# Patient Record
Sex: Male | Born: 1935 | Race: White | Hispanic: No | Marital: Married | Smoking: Former smoker
Health system: Southern US, Community
[De-identification: ages and names within clinical notes are randomized; demographics above are authoritative.]

## PROBLEM LIST (undated history)

## (undated) DIAGNOSIS — J189 Pneumonia, unspecified organism: Secondary | ICD-10-CM

## (undated) DIAGNOSIS — J9610 Chronic respiratory failure, unspecified whether with hypoxia or hypercapnia: Secondary | ICD-10-CM

## (undated) DIAGNOSIS — I10 Essential (primary) hypertension: Secondary | ICD-10-CM

## (undated) DIAGNOSIS — I219 Acute myocardial infarction, unspecified: Secondary | ICD-10-CM

## (undated) DIAGNOSIS — N200 Calculus of kidney: Secondary | ICD-10-CM

## (undated) DIAGNOSIS — I2510 Atherosclerotic heart disease of native coronary artery without angina pectoris: Secondary | ICD-10-CM

## (undated) DIAGNOSIS — E1143 Type 2 diabetes mellitus with diabetic autonomic (poly)neuropathy: Secondary | ICD-10-CM

## (undated) DIAGNOSIS — IMO0001 Reserved for inherently not codable concepts without codable children: Secondary | ICD-10-CM

## (undated) DIAGNOSIS — R011 Cardiac murmur, unspecified: Secondary | ICD-10-CM

## (undated) DIAGNOSIS — E785 Hyperlipidemia, unspecified: Secondary | ICD-10-CM

## (undated) DIAGNOSIS — H269 Unspecified cataract: Secondary | ICD-10-CM

## (undated) DIAGNOSIS — I214 Non-ST elevation (NSTEMI) myocardial infarction: Secondary | ICD-10-CM

## (undated) HISTORY — PX: JOINT REPLACEMENT: SHX530

## (undated) HISTORY — DX: Hyperlipidemia, unspecified: E78.5

## (undated) HISTORY — PX: SPINE SURGERY: SHX786

## (undated) HISTORY — PX: CARDIAC CATHETERIZATION: SHX172

## (undated) HISTORY — DX: Essential (primary) hypertension: I10

## (undated) HISTORY — PX: CORONARY ARTERY BYPASS GRAFT: SHX141

## (undated) HISTORY — PX: EYE SURGERY: SHX253

## (undated) HISTORY — PX: LITHOTRIPSY: SUR834

## (undated) HISTORY — DX: Unspecified cataract: H26.9

## (undated) HISTORY — PX: CORONARY ANGIOPLASTY WITH STENT PLACEMENT: SHX49

## (undated) HISTORY — DX: Atherosclerotic heart disease of native coronary artery without angina pectoris: I25.10

## (undated) HISTORY — PX: TONSILLECTOMY: SUR1361

## (undated) HISTORY — PX: CATARACT EXTRACTION W/ INTRAOCULAR LENS IMPLANT: SHX1309

## (undated) HISTORY — DX: Acute myocardial infarction, unspecified: I21.9

## (undated) HISTORY — PX: BACK SURGERY: SHX140

---

## 1999-07-14 ENCOUNTER — Ambulatory Visit (HOSPITAL_COMMUNITY): Admission: RE | Admit: 1999-07-14 | Discharge: 1999-07-14 | Payer: Self-pay | Admitting: Orthopedic Surgery

## 1999-07-14 ENCOUNTER — Encounter: Payer: Self-pay | Admitting: Orthopedic Surgery

## 2000-03-09 ENCOUNTER — Ambulatory Visit (HOSPITAL_COMMUNITY): Admission: RE | Admit: 2000-03-09 | Discharge: 2000-03-10 | Payer: Self-pay | Admitting: Cardiovascular Disease

## 2001-10-02 ENCOUNTER — Encounter: Admission: RE | Admit: 2001-10-02 | Discharge: 2001-12-31 | Payer: Self-pay | Admitting: Internal Medicine

## 2002-07-13 ENCOUNTER — Ambulatory Visit (HOSPITAL_COMMUNITY): Admission: RE | Admit: 2002-07-13 | Discharge: 2002-07-13 | Payer: Self-pay | Admitting: Orthopedic Surgery

## 2002-07-13 ENCOUNTER — Encounter: Admission: RE | Admit: 2002-07-13 | Discharge: 2002-07-13 | Payer: Self-pay | Admitting: Orthopedic Surgery

## 2002-07-13 ENCOUNTER — Encounter: Payer: Self-pay | Admitting: Orthopedic Surgery

## 2002-07-18 ENCOUNTER — Ambulatory Visit (HOSPITAL_COMMUNITY): Admission: RE | Admit: 2002-07-18 | Discharge: 2002-07-18 | Payer: Self-pay | Admitting: Cardiovascular Disease

## 2002-08-10 ENCOUNTER — Inpatient Hospital Stay (HOSPITAL_COMMUNITY): Admission: RE | Admit: 2002-08-10 | Discharge: 2002-08-13 | Payer: Self-pay | Admitting: Orthopedic Surgery

## 2002-08-10 ENCOUNTER — Encounter: Payer: Self-pay | Admitting: Orthopedic Surgery

## 2003-09-26 HISTORY — PX: CARDIOVASCULAR STRESS TEST: SHX262

## 2003-10-01 ENCOUNTER — Ambulatory Visit (HOSPITAL_COMMUNITY): Admission: RE | Admit: 2003-10-01 | Discharge: 2003-10-01 | Payer: Self-pay | Admitting: Cardiovascular Disease

## 2004-03-04 ENCOUNTER — Encounter: Admission: RE | Admit: 2004-03-04 | Discharge: 2004-03-04 | Payer: Self-pay | Admitting: Orthopedic Surgery

## 2004-08-10 ENCOUNTER — Encounter: Admission: RE | Admit: 2004-08-10 | Discharge: 2004-08-10 | Payer: Self-pay | Admitting: Internal Medicine

## 2006-08-13 ENCOUNTER — Inpatient Hospital Stay (HOSPITAL_COMMUNITY): Admission: EM | Admit: 2006-08-13 | Discharge: 2006-08-18 | Payer: Self-pay | Admitting: Emergency Medicine

## 2006-08-15 ENCOUNTER — Encounter: Payer: Self-pay | Admitting: Cardiovascular Disease

## 2006-08-17 ENCOUNTER — Ambulatory Visit: Payer: Self-pay | Admitting: Physical Medicine & Rehabilitation

## 2006-09-28 ENCOUNTER — Ambulatory Visit (HOSPITAL_COMMUNITY): Admission: RE | Admit: 2006-09-28 | Discharge: 2006-09-28 | Payer: Self-pay | Admitting: Orthopedic Surgery

## 2006-09-28 ENCOUNTER — Encounter (INDEPENDENT_AMBULATORY_CARE_PROVIDER_SITE_OTHER): Payer: Self-pay | Admitting: *Deleted

## 2006-10-12 ENCOUNTER — Encounter (INDEPENDENT_AMBULATORY_CARE_PROVIDER_SITE_OTHER): Payer: Self-pay | Admitting: Specialist

## 2006-10-12 ENCOUNTER — Ambulatory Visit (HOSPITAL_COMMUNITY): Admission: RE | Admit: 2006-10-12 | Discharge: 2006-10-12 | Payer: Self-pay | Admitting: Orthopedic Surgery

## 2007-09-06 ENCOUNTER — Encounter: Admission: RE | Admit: 2007-09-06 | Discharge: 2007-09-06 | Payer: Self-pay | Admitting: Orthopedic Surgery

## 2008-02-04 ENCOUNTER — Emergency Department (HOSPITAL_COMMUNITY): Admission: EM | Admit: 2008-02-04 | Discharge: 2008-02-04 | Payer: Self-pay | Admitting: Emergency Medicine

## 2008-02-06 ENCOUNTER — Encounter (INDEPENDENT_AMBULATORY_CARE_PROVIDER_SITE_OTHER): Payer: Self-pay | Admitting: Orthopedic Surgery

## 2008-02-07 ENCOUNTER — Inpatient Hospital Stay (HOSPITAL_COMMUNITY): Admission: RE | Admit: 2008-02-07 | Discharge: 2008-02-10 | Payer: Self-pay | Admitting: Orthopedic Surgery

## 2008-04-09 ENCOUNTER — Emergency Department (HOSPITAL_COMMUNITY): Admission: EM | Admit: 2008-04-09 | Discharge: 2008-04-10 | Payer: Self-pay | Admitting: Emergency Medicine

## 2008-05-07 ENCOUNTER — Inpatient Hospital Stay (HOSPITAL_COMMUNITY): Admission: RE | Admit: 2008-05-07 | Discharge: 2008-05-10 | Payer: Self-pay | Admitting: Orthopedic Surgery

## 2008-05-07 ENCOUNTER — Encounter (INDEPENDENT_AMBULATORY_CARE_PROVIDER_SITE_OTHER): Payer: Self-pay | Admitting: Orthopedic Surgery

## 2008-11-21 ENCOUNTER — Inpatient Hospital Stay (HOSPITAL_COMMUNITY): Admission: EM | Admit: 2008-11-21 | Discharge: 2008-11-29 | Payer: Self-pay | Admitting: Emergency Medicine

## 2008-11-25 ENCOUNTER — Encounter (INDEPENDENT_AMBULATORY_CARE_PROVIDER_SITE_OTHER): Payer: Self-pay | Admitting: Neurological Surgery

## 2009-01-16 HISTORY — PX: US ECHOCARDIOGRAPHY: HXRAD669

## 2009-03-05 ENCOUNTER — Emergency Department (HOSPITAL_COMMUNITY): Admission: EM | Admit: 2009-03-05 | Discharge: 2009-03-06 | Payer: Self-pay | Admitting: Emergency Medicine

## 2010-09-02 ENCOUNTER — Ambulatory Visit: Payer: Self-pay | Admitting: Cardiovascular Disease

## 2011-03-03 LAB — POCT I-STAT, CHEM 8
BUN: 31 mg/dL — ABNORMAL HIGH (ref 6–23)
Calcium, Ion: 1.19 mmol/L (ref 1.12–1.32)
HCT: 39 % (ref 39.0–52.0)
TCO2: 27 mmol/L (ref 0–100)

## 2011-03-04 ENCOUNTER — Other Ambulatory Visit: Payer: Self-pay | Admitting: Cardiovascular Disease

## 2011-03-04 DIAGNOSIS — I2510 Atherosclerotic heart disease of native coronary artery without angina pectoris: Secondary | ICD-10-CM

## 2011-03-05 NOTE — Telephone Encounter (Signed)
escribe medication per fax request  

## 2011-03-08 LAB — GLUCOSE, CAPILLARY
Glucose-Capillary: 103 mg/dL — ABNORMAL HIGH (ref 70–99)
Glucose-Capillary: 149 mg/dL — ABNORMAL HIGH (ref 70–99)
Glucose-Capillary: 158 mg/dL — ABNORMAL HIGH (ref 70–99)
Glucose-Capillary: 169 mg/dL — ABNORMAL HIGH (ref 70–99)
Glucose-Capillary: 172 mg/dL — ABNORMAL HIGH (ref 70–99)
Glucose-Capillary: 186 mg/dL — ABNORMAL HIGH (ref 70–99)
Glucose-Capillary: 189 mg/dL — ABNORMAL HIGH (ref 70–99)
Glucose-Capillary: 194 mg/dL — ABNORMAL HIGH (ref 70–99)
Glucose-Capillary: 202 mg/dL — ABNORMAL HIGH (ref 70–99)
Glucose-Capillary: 203 mg/dL — ABNORMAL HIGH (ref 70–99)
Glucose-Capillary: 220 mg/dL — ABNORMAL HIGH (ref 70–99)
Glucose-Capillary: 220 mg/dL — ABNORMAL HIGH (ref 70–99)
Glucose-Capillary: 226 mg/dL — ABNORMAL HIGH (ref 70–99)
Glucose-Capillary: 247 mg/dL — ABNORMAL HIGH (ref 70–99)
Glucose-Capillary: 249 mg/dL — ABNORMAL HIGH (ref 70–99)
Glucose-Capillary: 265 mg/dL — ABNORMAL HIGH (ref 70–99)
Glucose-Capillary: 290 mg/dL — ABNORMAL HIGH (ref 70–99)
Glucose-Capillary: 322 mg/dL — ABNORMAL HIGH (ref 70–99)

## 2011-03-08 LAB — BASIC METABOLIC PANEL
BUN: 20 mg/dL (ref 6–23)
BUN: 23 mg/dL (ref 6–23)
Calcium: 8.2 mg/dL — ABNORMAL LOW (ref 8.4–10.5)
Chloride: 112 mEq/L (ref 96–112)
Creatinine, Ser: 0.99 mg/dL (ref 0.4–1.5)
Creatinine, Ser: 1.2 mg/dL (ref 0.4–1.5)
GFR calc Af Amer: >60 mL/min (ref >60)
GFR calc non Af Amer: 59 mL/min — ABNORMAL LOW (ref >60)
GFR calc non Af Amer: >60 mL/min (ref >60)

## 2011-03-08 LAB — HEMOCCULT GUIAC POC 1CARD (OFFICE): Fecal Occult Bld: NEGATIVE

## 2011-03-08 LAB — COMPREHENSIVE METABOLIC PANEL
ALT: 19 U/L (ref 0–53)
Alkaline Phosphatase: 59 U/L (ref 39–117)
BUN: 20 mg/dL (ref 6–23)
CO2: 27 mEq/L (ref 19–32)
CO2: 31 mEq/L (ref 19–32)
Calcium: 8.1 mg/dL — ABNORMAL LOW (ref 8.4–10.5)
Calcium: 9.3 mg/dL (ref 8.4–10.5)
Creatinine, Ser: 0.92 mg/dL (ref 0.4–1.5)
GFR calc non Af Amer: >60 mL/min (ref >60)
GFR calc non Af Amer: >60 mL/min (ref >60)
Glucose, Bld: 134 mg/dL — ABNORMAL HIGH (ref 70–99)
Glucose, Bld: 247 mg/dL — ABNORMAL HIGH (ref 70–99)
Sodium: 142 mEq/L (ref 135–145)
Total Bilirubin: 0.6 mg/dL (ref 0.3–1.2)

## 2011-03-08 LAB — AFB CULTURE WITH SMEAR (NOT AT ARMC)

## 2011-03-08 LAB — CULTURE, BLOOD (ROUTINE X 2): Culture: NO GROWTH

## 2011-03-08 LAB — CBC
HCT: 29.3 % — ABNORMAL LOW (ref 39.0–52.0)
Hemoglobin: 10 g/dL — ABNORMAL LOW (ref 13.0–17.0)
Hemoglobin: 12.6 g/dL — ABNORMAL LOW (ref 13.0–17.0)
Hemoglobin: 9.8 g/dL — ABNORMAL LOW (ref 13.0–17.0)
MCHC: 33.2 g/dL (ref 30.0–36.0)
MCHC: 34 g/dL (ref 30.0–36.0)
MCHC: 34 g/dL (ref 30.0–36.0)
MCV: 100.8 fL — ABNORMAL HIGH (ref 78.0–100.0)
MCV: 101.1 fL — ABNORMAL HIGH (ref 78.0–100.0)
Platelets: 126 10*3/uL — ABNORMAL LOW (ref 150–400)
Platelets: 135 10*3/uL — ABNORMAL LOW (ref 150–400)
RBC: 2.82 MIL/uL — ABNORMAL LOW (ref 4.22–5.81)
RBC: 2.84 MIL/uL — ABNORMAL LOW (ref 4.22–5.81)
RBC: 3 MIL/uL — ABNORMAL LOW (ref 4.22–5.81)
RBC: 3.76 MIL/uL — ABNORMAL LOW (ref 4.22–5.81)
RDW: 13.9 % (ref 11.5–15.5)
RDW: 16.2 % — ABNORMAL HIGH (ref 11.5–15.5)
WBC: 10.2 10*3/uL (ref 4.0–10.5)
WBC: 9.4 10*3/uL (ref 4.0–10.5)

## 2011-03-08 LAB — FUNGUS CULTURE W SMEAR: Fungal Smear: NONE SEEN

## 2011-03-08 LAB — CROSSMATCH: ABO/RH(D): O NEG

## 2011-03-08 LAB — WOUND CULTURE: Culture: NO GROWTH

## 2011-03-08 LAB — ANAEROBIC CULTURE

## 2011-03-08 LAB — GRAM STAIN

## 2011-03-08 LAB — C-REACTIVE PROTEIN: CRP: 0.3 mg/dL — ABNORMAL LOW (ref <0.6)

## 2011-04-06 NOTE — Op Note (Signed)
NAME:  [name], [name]              ACCOUNT NO.:  [id number]   MEDICAL RECORD NO.:  [id number]          PATIENT TYPE:  OIB   LOCATION:  1614                         FACILITY:  [facility]   PHYSICIAN:  Marlowe Kays, M.D.  DATE OF BIRTH:  [birth year]   DATE OF PROCEDURE:  02/06/2008  DATE OF DISCHARGE:                               OPERATIVE REPORT   PREOPERATIVE DIAGNOSES:  1. Spinal stenosis, L2-3.  2. Herniated nucleus pulposus with free fragment of herniated disk, L2-      3, left.   POSTOPERATIVE DIAGNOSES:  1. Spinal stenosis, L2-3.  2. Herniated nucleus pulposus with free fragment of herniated disk, L2-      3, left.   OPERATION:  1. A central decompressive laminectomy, L2-3.  2. Microdiskectomy with removal of large amount of free fragments, L2-      3, left.   SURGEON:  Illene Labrador. Aplington, MD.   Threasa HeadsGeorges Lynch. Gioffre, MD.   ANESTHESIA:  General.   PATHOLOGY AND INDICATIONS FOR PROCEDURE:  He has had a number of back  surgeries in the past, has a very arthritic back.  Following an incident  at home around January 20th, he had sudden onset of pain into his back  and legs, primarily his left leg.  He has a peripheral neuropathy due to  diabetes with a severe progression of his pain recently.  He had a  lumbar MRI with gadolinium performed on January 24, 2008, which  demonstrated the above diagnoses.  Accordingly, because of the  significant pain and disability, he is here today for the above-  mentioned surgery.  It was felt that there were two components to his  problem with both his spinal stenosis and the disk herniation.   PROCEDURE:  Prophylactic antibiotics, satisfactory general anesthesia,  prone position on the Wilson frame.  Back was prepped with DuraPrep,  draped in a sterile field, a timeout performed.  With three spinal  needles and a lateral x-ray, I tentatively localized the area of surgery  and made a midline incision at that point.  Due to prior  surgery, he had  had bone removed at L3 and we felt was most likely the spinous process  of L2.  We isolated it and tied with a Kocher clamp, a lateral x-ray  confirming that we were at L2-3 with the clamp exactly at the level of  the disk space.  Accordingly, we dissected soft tissue off the neural  arch of L2 and marked cephalad and caudad along the limits of bone and  then with 2 and 3 mm Kerrison rongeurs as well as a double-action  rongeur began the decompression, working up to a previous level of  decompression at L1 and distally at L3.  When the dissection became more  difficult, we brought in the microscope, he had a significant bony and  ligamentum flavum stenosis, which we resected.  The foramina for both L3  nerve roots was widely opened, we had to do a good bit of dissection  laterally at L2-3 to adequate exposure, but we were finally able to  identify safely the dura and the L3 nerve root, what we thought was the  disk, which I identified with a spinal needle and lateral x-ray  confirming that we were at the L2-3 disk.  Based on this, I opened up  the interspace with a 15 knife blade, a large amount of disk material  came forth under pressure.  With a nerve hook, we probed up around the  body of L2 cephalad and I removed at least 3 to 4 large fragments of  disk material, which was sent to Pathology.  We kept probing until all  disk material obtainable had been removed, and we also removed all disk  material from the interspace that was possible.  At the conclusion of  the case, the foramen was widely patent and there was no unusual  bleeding, Gelfoam soaked in thrombin was placed around the interspace  and around the nerve and over the dura.  The self-retaining McCullough  retractor was removed carefully, and the closure was performed with  interrupted #1 Vicryl in the paralumbar muscle and fascia, two #0 Vicryl  in the subcutaneous tissues, staples in the skin, a Betadine,  Adaptic,  and a dry sterile dressing were applied, he tolerated the procedure well  and was taken to the recovery room in satisfactory condition with no  known complications.           ______________________________  Marlowe Kays, M.D.     JA/MEDQ  D:  02/06/2008  T:  02/06/2008  Job:  [id number]

## 2011-04-06 NOTE — Discharge Summary (Signed)
[name], [name]              ACCOUNT NO.:  [id number]   MEDICAL RECORD NO.:  [id number]          PATIENT TYPE:  INP   LOCATION:  1614                         FACILITY:  [facility]   PHYSICIAN:  Marlowe Kays, M.D.  DATE OF BIRTH:  1928-01-16   DATE OF ADMISSION:  02/06/2008  DATE OF DISCHARGE:  02/09/2008                               DISCHARGE SUMMARY   ADMITTING DIAGNOSES:  1. Spinal stenosis L2-L3, with herniated nucleus pulposus, with free      fragment of herniated disc at L2-L3 on the left.  2. Diabetes mellitus.  3. Hypertension.   DISCHARGE DIAGNOSES:  1. Spinal stenosis L2-L3, with herniated nucleus pulposus, with free      fragment of herniated disc at L2-L3 on the left.  2. Diabetes mellitus.  3. Hypertension.  4. Postoperative confusion secondary to analgesics.   OPERATION:  On February 06, 2008, the patient underwent:  1. Central decompressive lumbar that lumbar laminectomy at L2 L3.  2. Microdiskectomy, with removal of large amount of free fragments at      L2-L3 on the left.  Dr. Ranee Gosselin assisted.   CONSULTS:  Dr. Creola Corn, internal medicine.   BRIEF HISTORY:  This 77 year old white male with history of several  episodes of surgical intervention to the back in the past has developed  a very arthritic lumbar spine.  On December 12, 2007, he was at home, had  an incident work which caused a rapid onset of pain into his back and  legs.  This was primarily on the left than the right.  Lumbar MRI with  gadolinium was performed which demonstrated the spinal stenosis with the  hernia nucleus pulposus at L2-L3. This is an active gentleman despite  his age, but this pain was interfering with his day-to-day activities,  his sleep cycle, and after much discussion, including the risks and  benefits of surgery, it was decided to go ahead with the above surgery.   COURSE IN THE HOSPITAL:  The patient tolerated the surgical procedure  quite well.  The analgesics were  given postoperatively, as well as those  that were given for his postoperative pain, and caused a considerable  amount of confusion.  He also had elevated CBGs as well.  We asked Dr.  Creola Corn to see the patient and evaluate him, which he did.  We  discontinued his PCA morphine, cut back on his analgesics, and Dr. Timothy Lasso  was adjusted his insulin.  Eventually, the patient was able to become  more awake and alert.  Because of the above, he had difficulty with  early-on physical therapy and occupational therapy, and maximum assist,  difficulty even standing.  We kept the patient in the hospital for more  therapy and to determine if he was safe to go home or if he need to have  skilled nursing.  His wife is quite petite, and we did not feel that she  could manage him at home, and after much discussion with staff and  family as well as with Dr. Timothy Lasso, it was decided the patient would best  be served  for a short period of time to have physical therapy,  occupational therapy, and rehabilitation in skilled nursing for a short  while.   The patient had been [facility] for a fractured pelvis in the past, and  he chose to try to get into that facility.  At the time of this  dictation, those plans are being firmed up.   The patient had some residual numbness postoperatively, but this  resolved.  His strength in his lower extremities was 4.5/5 in the left  lower extremity, 5/5 in the right lower extremity.  His sensory  essentially was the same, as the patient had developed peripheral  neuropathy secondary to his diabetes.  Therapy done prior to the  anticipated day of discharge showed minimal assist with ambulation.   Laboratory findings in the hospital showed a stable hemoglobin at 11.1,  the hematocrit was 31.6.  White count was 8.7.  Blood chemistries on  February 08, 2008 were normal, other than slightly elevated glucose at 166.  Electrocardiogram showed sinus rhythm, with premature atrial  complexes,  T-wave abnormality, which pointed to consider lateral ischemia.  This  was compared with his August 15, 2006 EKG.  Chest x-ray showed no  active disease.   CONDITION ON DISCHARGE:  Improved, stable.   PLAN:  The patient may continue with his occupational therapy, physical  therapy, with weightbearing as tolerated in the lower extremities.  Will  use walker as necessary, progressing to achieve independence, safety,  and confidence with ambulation and activities of daily living.  Any  medical questions can be directed towards Dr. Creola Corn.  He is to  return to see Dr. Simonne Come approximately 2 to 2-1/2 weeks after the  date of surgery.   MEDICATIONS LIST ON THE CHART AT DISCHARGE:  1. Lisinopril 20 mg tab daily.  2. Voltaren 75 mg tab EC b.i.d.  3. Simvastatin 40 mg p.o. q.1800 hours.  4. Insulin per sliding scale or per Dr. Ferd Hibbs instructions.  5. Atenolol 12.5 mg at bedtime.  6. TriCor 145 mg tab daily.  7. Flomax 0.4 mg cap daily until able to void without difficulty.  8. Multivitamins with minerals daily.  9. Robaxin 500 mg q.6 h. p.r.n. muscle spasms.  10.Darvocet-N 100, 1-2 q.4-6 h. p.r.n. pain.   DIET:  As was preop, and based on Dr. Ferd Hibbs recommendations for his  diabetic state.   Any orthopedic questions can be directed towards Dr. Leim Fabry office  at [phone].      Dooley L. Cherlynn June.    ______________________________  Marlowe Kays, M.D.    DLU/MEDQ  D:  02/09/2008  T:  02/09/2008  Job:  [id number]   cc:   Gwen Pounds, MD  Fax: [phone]

## 2011-04-06 NOTE — Consult Note (Signed)
NAME:  [name], BAMBER NO.:  [id number]   MEDICAL RECORD NO.:  [id number]          PATIENT TYPE:  INP   LOCATION:  5501                         FACILITY:  [facility]   PHYSICIAN:  Alvy Beal, MD    DATE OF BIRTH:  1927/12/18   DATE OF CONSULTATION:  DATE OF DISCHARGE:                                 CONSULTATION   REFERRING PHYSICIAN:  Gwen Pounds, MD   REASON FOR CONSULTATION:  Intractable back pain with bilateral leg pain,  left side being worse than the right.   HISTORY:  [name] is a very pleasant 77 year old gentleman who has  been suffering with chronic low back and bilateral leg pain, right side  typically worse than the left since the mid 90s.  He has been followed  by my partner Dr. Simonne Come and has had a combination of 5 lumbar  posterior based operations, most recently was in 2003 where he had an L3-  4 decompression for lumbar spinal stenosis, and then in March 2009, he  had an L2-3 left-sided diskectomy for a L2-3 disk herniation and then in  June 2009 had a recurrent disk herniation at that same level and had a  repeat lumbar diskectomy at L2-3.  The patient states that he never  really got significant relief for any prolonged period of time from  these operations.  He noted that after the most recent one in June, his  pain was significantly worse.  The patient states that he has constant  intractable back pain.  Because Dr. Simonne Come felt there was no further  surgical options for him, Dr. Timothy Lasso had him evaluated by Dr. Vear Clock,  the pain medical management specialist.  Despite attempts multiple  lumbar epidural injections and appropriate narcotic pain medications,  the patient still has intractable pain.  It is difficult to use long-  acting narcotics as they affect his cognition.   At this point in time because of the failure of pain medical management,  the patient was referred to Dr. Opal Sidles (a neurosurgeon in [place]) for  evaluation.  In October 2009, he had this evaluation and was told by Dr.  Opal Sidles that further surgery on his back was not recommended.  According  to the patient, a CT scan and MRI were done by Dr. Opal Sidles, but  unfortunately, I do not have those to review at this time.   The patient states that he was in his usual state of discomfort until  about 4-5 days ago and the pain became quite severe.  He ultimately was  contacted Dr. Timothy Lasso and was instructed to go to the emergency room.  The  patient was admitted last evening on November 21, 2008, for intractable  pain.  Dr. Timothy Lasso contacted me as I was on-call for our group for further  evaluation and treatment.   At this point in time, the patient indicated that he denied any history  recently or in the past of incontinence of bowel or bladder.   At this point in time, on admission, Dr. Timothy Lasso did appropriate pain  management with multiple medications, and  the patient currently is  comfortable in bed.  It is difficult for him to move around in bed  because of severe pain and he has not been out of bed because of that.   The patient's past medical, surgical, family, social history is quite  extensive.  He has type 2 diabetes mellitus with peripheral diabetic  neuropathy, coronary artery disease with five-vessel coronary artery  bypass, and grafting in 1998, and percutaneous intervention in 2001,  hyperlipidemia, nephrolithiasis, history of PVCs, history of anemia,  history of pelvic fracture x2 including the pelvic rami fracture treated  nonoperatively.  He has had a tonsil and adenoidectomy.  He has had a  hand surgery.  He has had 5 previous lumbar spinal operations, rotator  cuff repair.  He has got a history of C. diff, history of  diverticulosis, and hemorrhoids.   ALLERGIES:  TETRACYCLINE, QUINIDINE, LONG-ACTING MORPHINE, AND FENTANYL.   CURRENT MEDICATIONS:  Upon admission include Diflucan, Lantus, Humalog,  OneTouch Ultra meter to  check blood sugars, Ultram, Neurontin,  lisinopril, atenolol, TriCor, NitroQuick, vitamin E, multivitamin,  simvastatin, Prilosec, and Cordran steroid cream as directed.   SOCIAL HISTORY:  He denies tobacco use and illicit drugs.  He was  recently married to his second wife and he is retired.   PHYSICAL EXAMINATION:  GENERAL:  The patient currently is in a hospital  bed.  He is comfortable.  He is alert and oriented x3 and has complete  recollection of all the events and is clearly able to express them to  me.  NEUROLOGIC:  He is grossly neurologically intact.  Cranial nerves II  through XII were tested and they were intact.  CHEST:  He has no shortness of breath or chest pain at present.  ABDOMEN:  Soft and nontender.  No history of incontinence of bowel and  bladder.  MUSCULOSKELETAL:  He has no significant pain with hip, knee, and ankle  passive range of motion.  Intact, but diminished peripheral pulses in  the lower extremity.  He has 5/5 strength in tibialis anterior, EHL,  gastrocnemius.  He also has 5/5 strength in the hip extensors, hip  flexors, quad, and hamstrings.  He has a negative Babinski sign.  No  clonus.  Symmetrical but diminished deep tendon reflexes at the knee,  absent at the Achilles.  He has reproduction of back pain with straight  leg maneuvering bilaterally with the left side eliciting more pain than  the right.  There is no significant radicular leg pain present with  straight leg maneuvering.   He has no pain with palpation over the SI joint.  He has significant  pain when he attempts to sit up or move in bed.   LABORATORY DATA:  Clinical x-rays taken yesterday were compared to those  taken in June at the time of this surgery and to a CT scan.  The patient  has an obvious deformity at L2-3 with loss of vertebral body height of  L2 and disk erosion at L2-3.  He also has a slight lateral listhesis at  L2-3 causing a deformity.  He also has multilevel  degenerative lumbar  disease, L3-4, L4-5, L5-S1 with near complete collapse of the disk  spaces at all those levels.  There is loss of normal lumbar lordosis on  the sagittal plane.   At this point in time, I believe that the patient's major source of pain  is the L2-3 deformity.  This is consistent with a possible diskitis  or  osteomyelitis or may be an iatrogenic or late postoperative instability.   At this point in time, after lengthy discussion with the patient, I  indicated to him that surgery may be an option, but it is a very  extensive operation.  Before recommending any particular surgery, I did  indicate that there is some more information that I would need.  I agree  with the radiologist that an MRI with and without contrast is needed.  This would allow [place] to evaluate for recurrent disk herniation,  recurrence or severe spinal stenosis, and evidence of a diskitis or  osteomyelitis.  Furthermore, a CT scan would also be recommended as this  would allow better evaluation of the bony architecture especially the L2  vertebral body destruction.  Furthermore, I would like to obtain an  upright standing x-ray of the lumbar spine.  This would allow better  evaluation of the overall sagittal alignment when the spine is in a  weightbearing position.  Collectively with this information, I could  better determine whether or not a surgical procedure would be indicated  and what that procedure would be.  However, at first glance, I think if  surgery was going to be required, he would have to address the deformity  and instability at L2-3, which will be most likely an anterior L2  corpectomy with strut graft reconstruction and instrumentation.  Given  his age and previous surgeries, he would also most likely include  supplemental posterior fixation.  In addition, I will also have to  discuss with him the possibility of addressing the degenerative disk  disease and including that in the  fusion levels.  At this point, the  patient indicated he would like to speak with Dr. Timothy Lasso before  proceeding with any further tests.  We have also  talked about him trying to get his MRI and CT scan from the [facility] so  that these can be reviewed and we can avoid duplication of tests.  I  will see the patient again once all the appropriate tests have been  done.  If there is any questions or concerns.  I can be reached on my  pager [phone].      Alvy Beal, MD  Electronically Signed     DDB/MEDQ  D:  11/22/2008  T:  11/22/2008  Job:  [id number]   cc:   Gwen Pounds, MD

## 2011-04-06 NOTE — H&P (Signed)
NAMEJAYCUB, Barron NO.:  [id number]   MEDICAL RECORD NO.:  [id number]          PATIENT TYPE:  INP   LOCATION:  5501                         FACILITY:  [facility]   PHYSICIAN:  Gwen Pounds, MD       DATE OF BIRTH:  [birth year]   DATE OF ADMISSION:  11/21/2008  DATE OF DISCHARGE:                              HISTORY & PHYSICAL   PRIMARY CARE Zuhair Lariccia:  The primary care Gordon Vandunk is myself.   CARDIOLOGIST:  Vesta Mixer, M.D.   ORTHOPEDIC SURGEON:  Marlowe Kays, M.D.   PAIN MANAGEMENT PHYSICIAN:  Pain control physician is Mark L. Vear Clock,  M.D.   NEUROSURGEON:  The patient's neurosurgeon is over at [facility], Bartholomew Crews, M.D.   CHIEF COMPLAINT:  Progressive worsening of low-back pain and failure to  thrive.   HISTORY OF THE PRESENT ILLNESS:  This is a 77 year old male with  multiple problems status post five lower back surgeries with two in the  last year; and, Dr. Simonne Come did all of them.  The patient saw Dr.  Simonne Come last in July and then saw Dr. Burtis Junes at [facility] on August 28, 2008.  Dr. Burtis Junes really did not have  too much to offer and stated further surgeries would to be too risky.  The patient was subsequently referred to pain management, Dr. Vear Clock,  who has the patient on narcotics.  Unfortunately the patient cannot  tolerate long-acting medications including fentanyl and Darvocet.   Since seeing me about 2-3 weeks ago the patient is worse with increasing  pain, decreasing function and the lessened ability to move with his  walker over the last couple days and it has worsened.  The last 2 nights  he has been up all night with pain, but he denies any functional issues  with bowels or bladder.  The patient and family called today with  complaints of increasing pain and not eating very well, and the patient  was miserable and could not get out of bed.  We told him to call 9-1-1  and go  to the ED.  He denies any falls or trauma, or events leading to  the worsening.  He was doing okay until Sunday when his symptoms clearly  started  worsening.  Dr. Vear Clock had suggested getting a third opinion  with maybe Dr. Noel Gerold; and, is in the process of getting this set up.  In  the ED the patient was given Zofran, Toradol and Dilaudid.  He is  currently okay currently lying in bed.  He will be admitted for further  evaluation and treatment.  He is now also complaining of left leg pain  with left-sided radiculopathy at that aspect.   PAST MEDICAL AND SURGICAL HISTORY:  The past medical and surgical  history include:  1. Type 2 diabetes mellitus.  2. Neuropathy.  3. Coronary artery disease with five-vessel coronary artery bypass      grafting in 1998 and percutaneous intervention in 2001.  4. Hyperlipidemia.  5. Nephrolithiasis.  6. History of PVCs.  7. History of  anemia.  8. History of pelvic fracture x2 including pubic ramus fracture.  9. History of tonsilloadenoidectomy.  10.History of hand surgery.  11.History of 3 lumbar laminectomies in the 1990s and 2003.  12.History of  L2-3 laminectomy and microdiskectomy in March 2009 and      June 2009.  13.History of rotator cuff repair in 2005.  14.History of C-difficile colitis in September 2005.  15.History of diverticulosis; and,  16.Hemorrhoids.   ALLERGIES:  ALLERGIES INCLUDE TETRACYCLINE, QUINIDINE, LONG-ACTING  MORPHINE AND FENTANYL.   MEDICATIONS:  The patient's medication list includes:  1. Diclofenac 75 mg twice a day.  2. Lantus 40 units at bedtime.  3. Humalog Pen as directed.  4. One touch Ultra meter to check blood sugars as directed.  5. Ultram 50 mg by mouth twice a day.  6. Neurontin 300 mg by mouth four times a day.  7. Lisinopril 20 mg by mouth every day.  8. Atenolol 25 mg 1/2 every day.  9. Tricor 145 mg by mouth every day.  10.NitroQuick 0.4 mg the sublingual as needed.  11.Vitamin E 400 units every  day.  12.Multivitamin 1 by mouth every day.  13.Simvastatin 40 mg by mouth every day.  14.Prilosec 20 mg by mouth every day.  15.Cordran steroid cream to be used as directed.   FAMILY HISTORY:  Diabetes is in the family.  Father died of pneumonia at  the age of 56.  Mother died at age 34 of old age.   SOCIAL HISTORY:  The patient is newly married to his second wife.  Retired.  No alcohol.  No tobacco.   REVIEW OF SYSTEMS:  Diarrhea 1-2 days ago, now improved with Imodium.  All the review of systems is negative; see HPI.  He denies any fevers,  any chills, and no chest pain, no shortness of breath, and no GI  bleeding.   PHYSICAL EXAMINATION:  VITAL SIGNS:  Temperature 98.3, blood pressure  187/79, heart rate 57, respiratory rate 18, and satting 97% on room air.  GENERAL APPEARANCE:  In general he is alert and oriented.  HEENT:  Oropharynx is clear.  NECK:  The neck has no JVD.  CHEST:  The chest is clear to auscultation.  HEART:  Cardiac - regular rhythm and sternotomy noted.  ABDOMEN:  The abdomen is soft and nontender.  EXTREMITIES:  The extremities shows the left leg with positive straight  leg raise.  Left low foot ulcer is gone.  No edema bilaterally.   ASSESSMENT:  This is an elderly man who has been on 4 Darvocets per day  and off his MS Contin because it caused too much confusion, on Ultram up  3 times per day, on Voltaren up to 2 times per day, and on Neurontin up  to 4 times per day who presents with worsening and severe low-back pain,  left leg radiculopathy and failure to thrive.   PLAN:  1. We will admit the patient.  2. We will increase pain management.  3. We will keep diabetes tight as possible.  4. We will add steroids and watch blood sugars carefully.  5. We will talk with [facility] about what they would do; I have      already talked with Dr. Venita Lick, and will see him and talk      about the stimulation and whether we need to do another MRI.   6. I will check another x-ray tonight of his low-back and rule out  fracture.  7. The patient is requesting possible consultation with Dr. Noel Gerold; I      will hold on this at the moment until after Dr. Shon Baton sees the      patient.  8. We will work on increasing function.  9. We will discontinue Zocor.  10.After I get the doses of home medications I will use them      accordingly.  11.No current coronary artery disease issues.  12.DVT prophylaxis has been ordered.  13.The recent left heel mild ulcer has already healed.  14.Hypertension; his blood pressure has been erratic due to the pain.  15.Severe low-back pain and other issues as described above.  He      remains on multiple medications; he cannot tolerate anything      stronger.  We are at the point where we kind of need to figure out      what the next step is in order to give this guy a quality of life      for which he really has not any.  16.Diarrhea.  He is not currently dehydrated.  His blood sugars are      fine.  He has been on a [employer] of late, he has taken Imodium and      he is already better.  We will follow carefully especially with his      history of C diff.  17.As reported per Dr. Burtis Junes in October the patient is a 77 year old      man with significant degenerative back disease who presents due to      persistent pain.  At this time we will presently continue workup      for possible surgical intervention.  They ordered scoliosis films.      They ordered MRI of the cervical, thoracic and lumbar spine.  I do      not have access to these at this current time.  He discussed  with      the patient the risks of possible surgery, but we will continue the      evaluation and discuss possible surgery with the next evaluation.      At that point he told the patient that there is not much he can      offer.  The report will be included in the hospital chart.      Gwen Pounds, MD  Electronically Signed      JMR/MEDQ  D:  11/21/2008  T:  11/22/2008  Job:  [id number]   cc:   Vesta Mixer, M.D.  Marlowe Kays, M.D.  Kathrin Penner. Vear Clock, M.D.  Bartholomew Crews

## 2011-04-06 NOTE — Op Note (Signed)
NAME:  [name], [name]              ACCOUNT NO.:  [id number]   MEDICAL RECORD NO.:  [id number]          PATIENT TYPE:  INP   LOCATION:  3102                         FACILITY:  [facility]   PHYSICIAN:  Stefani Dama, M.D.  DATE OF BIRTH:  Feb 14, 1928   DATE OF PROCEDURE:  11/25/2008  DATE OF DISCHARGE:                               OPERATIVE REPORT   PREOPERATIVE DIAGNOSIS:  Status post diskitis, osteomyelitis of L2-L3  with kyphoscoliosis, lumbar radiculopathy and back pain.   POSTOPERATIVE DIAGNOSIS:  Status post diskitis, osteomyelitis of L2-L3  with kyphoscoliosis, lumbar radiculopathy and back pain.   OPERATION:  Bilateral decompression of L2-L3 nerve roots with bilateral  diskectomy, pedicle screw fixation at L2-L3 with posterolateral  arthrodesis at L2-L3, posterior lumbar interbody arthrodesis with PEEK  spacers, local autograft and allograft L2-L3.   SURGEON:  Stefani Dama, MD   FIRST ASSISTANT:  Coletta Memos, MD   ANESTHESIA:  General endotracheal.   INDICATIONS:  [name] is a 77 year old individual who has had  previous diskectomy at L2-L3 which though initially improved seemed to  cause substantial back pain and leg pain with deterioration of his  function.  He underwent an MRI few days ago which demonstrates the  presence of a recurrent disk herniation with a severe kyphoscoliosis at  L2-L3 and findings consistent with diskitis and osteomyelitis.  Nonetheless, the patient has a normal white count and normal sed rate,  no fever.  He was really reasonably comfortable with recumbency.  It is  suspicious that he may have had a diskitis and osteomyelitis but this is  likely a non-active process.  However, because of the findings on the  films, I still felt that the patient needs to be decompressed and  stabilized.  Plan was to do a decompression and debridement if pus was  found.  If no active infection was noted, we will do a fusion.   PROCEDURE:  The patient  was brought to the operating room supine on the  stretcher.  After smooth induction of general endotracheal anesthesia,  he was turned prone and the back was then prepped with alcohol and  DuraPrep and draped in a sterile fashion.  An elliptical incision was  made around his previous scars and these were excised.  The lumbodorsal  fascia was then opened in the midline and the previous site of  laminectomy at L2-3 was uncovered.  Dissection was carried down to  localize the L2-L3.  This area was verified with a singular radiograph.  Then by working from the left side, the lateral aspect of the dura was  uncovered, ultimately, the disk space was entered.  There was noted to  be significant amount of very dense fibrous scar tissue, however, there  were pockets of tissue that appeared to be consistent with a caseating  type of necrotic material.  This was not consistent with pus.  As the  disk space was entered, several swabs were obtained for stat Gram stain  and also cultures however, no pus was encountered.  Some fluid was  encountered in the disk which was seen  mostly serosanguineous.  The area  was then explored and the common dural tube  was decompressed and the L2  nerve root was decompressed superiorly, L3 nerve root was decompressed  inferiorly on the left side.  Once this was completed, the dissection  was carried over to the right side where similar decompression was  undertaken to the L2 and the L3 nerve root.  The disk space was entered  here and the diskectomy was performed.  The right side of the disk space  was noted to be severely more collapsed than the left side.  Nonetheless  decortication of the endplates was undertaken.  Once these were prepared  adequately, interspace was sized for a 12-mm spacer.  A 12 mm spacer was  then also placed on the left side.  Bone graft was prepared in the form  of some of the autograft from the resection of the right inferior facet  joint  from L2.  Lateral gutters were decorticated and then EquivaBone a  total of 10 mL was prepared along with an extra small mixture of the  infuse bone morphogenic protein.  This combination was used in the  interspace both in the PEEK spacers and the surrounding bone grafting  site.  The intertransverse space on the right side and on the left side  were then packed with some remnants of the bone, then using fluoroscopic  guidance, pedicle screw entry sites were chosen at L2-L3 after adjusting  the position of the fluoro unit to visualize clearly each of the  individual pedicles.  A 6.5 x 50 mm screws were tapped into the bone and  at L2 and L3.  These were countersunk appropriately.  Then 40 mm  straight rods were used to connect the screw heads.  This was also used  in an effort to de-rotate the scoliotic curve that occurred at L2-L3.  Once this was accomplished, the transverse connector was put into place.  The remainder of the graft was packed into the lateral gutters.  The  area was inspected to make sure that the L2-L3 nerve roots were well  decompressed, which they were.  No CSF leaks were noted and once this  was finished, the wound was irrigated and then closed with #1 Vicryl in  the lumbodorsal fascia, 2-0 Vicryl in the subcutaneous tissues, 3-0  Vicryl subcuticularly and surgical staples were placed in the skin.  Blood loss for the procedure was estimated at 900 mL.  300 mL of Cell  Saver blood was returned to the patient.  The patient tolerated the  procedure well and was returned to recovery room.      Stefani Dama, M.D.  Electronically Signed     HJE/MEDQ  D:  11/25/2008  T:  11/26/2008  Job:  [id number]

## 2011-04-06 NOTE — Op Note (Signed)
[name], [name]              ACCOUNT NO.:  [id number]   MEDICAL RECORD NO.:  [id number]          PATIENT TYPE:  INP   LOCATION:  1615                         [facility]   PHYSICIAN:  Marlowe Kays, M.D.  DATE OF BIRTH:  1928/01/19   DATE OF PROCEDURE:  05/07/2008  DATE OF DISCHARGE:                               OPERATIVE REPORT   PREOPERATIVE DIAGNOSIS:  Recurrent herniated nucleus pulposus, L2-3  central and to the left with multiple extruded disk fragments.   POSTOPERATIVE DIAGNOSIS:  Recurrent herniated nucleus pulposus, L2-3  central and to the left with multiple of extruded disk fragments.   OPERATION:  Microdiskectomy L2-3 left with removal of possible extruded  disk fragments as well as microdiskectomy of the L2-3 interspace.   SURGEON:  Marlowe Kays, M.D.   ASSISTANT:  Georges Lynch. Darrelyn Hillock, M.D.   ANESTHESIA:  General.   INDICATIONS FOR PROCEDURE:  He had had a prior microdiskectomy roughly 3  months ago at the same level.  Initially did reasonably well but  subsequently developed progressive right leg pain.  Previously his pain  had been on the left.  I obtained a lumbar MRI with gadolinium May 01, 2008 which showed a huge amount of disk material extruded in the left  lateral recess and centrally.  His pain was almost exclusively on the  right, but it was clear on the MRI that it almost all the disk material  was to the left and I explained to him that we would go back in on the  left side because of this and it was possible that the right leg pain  was due to disk material pressing to his right.   PROCEDURE:  Prophylactic antibiotics.  Prone position on the Wilson  frame.  Back was prepped with DuraPrep, draped in a sterile field.  Time-  out performed.  I went through the old surgical incision, excising the  scar superiorly.  The spinous process which I thought was for the L1-L2  blocked vertebra was palpated and tagged with a Kocher clamp.  Lateral  x-  ray confirmed this indeed was spinous process of L1-L2 and just a little  higher than the disk fragments which were extruded both cephalad and  caudad from the L2-3 interspace.  Accordingly, we dissected slightly to  the right side to allow purchase of the self-retaining retractors and  then I worked laterally on the left side, working off the bone, feeling  the facet joints and then took a second x-ray with both a  ___________  and distal to that a Penfield-4 instrument.  This gave [place] the initial  anatomic information we needed with the __________  at the L2-3  interspace and a Penfield-4 at the distal-most portion where we felt the  extruded disk material lay.  The self-retaining McCullough retractors  were inserted and I then begin cautiously removing soft tissue off the  bone laterally, gradually working to the midline until I got to the bone  dural interface.  I was then able to cautiously place a 2 mm Kerrison  rongeur laterally and began removing  bone laterally working cephalad and  caudad until I had worked around lateral to the dura.  Superior facet of  superior articular process of the L2-3 facet was loose and I removed it,  which gave [place] additional exposure.  At this point, we got our first  glimpse of the extraordinary amount of disk material which again we were  removing with micropituitary as this gave [place] more exposure.  We kept  working, removing more disk material and gradually freeing up the  meninges and eventually identifying the L2-3 interspace which I was able  to enter and removed a good bit of additional disk material from that.  Then using this as a anatomic landmark, using the combination of  Penfield-4 nerve hook and Epstein curette, we worked both cephalad and  caudad in keeping with MRI and then gradually centrally removing the  large amounts of disk material, some of which were in moderate size  fragments and some of it was in smaller fragments.  There was  no one  large fragment itself, however.  We kept working until the dura was  freely movable and there did not appear to be anymore disk material  beneath the dura, both over the body of L3 or over the body of L2.  The  L3 nerve root was visualized and the foramen widely opened up with the 2  mm Kerrison.  Superior layer.  I was able to place a hockey stick in the  L2 foramen which was patent.  Both felt that at this point that we had  accomplished the goals of surgery and removed all disk material that was  visualized and obtainable.  I irrigated the wound well with sterile  saline and placed FloSeal in the depths of the wound around the soft  tissues and Gelfoam on top.  The fascia was reapproximated with  interrupted #1 Vicryl, subcutaneous tissue with 2-0 Vicryl and staples  in the skin.  A Betadine Adaptic dressing was applied and he was gently  placed on his PACU bed and taken there in satisfactory condition with no  known complications.  Blood loss was minimal.           ______________________________  Marlowe Kays, M.D.     JA/MEDQ  D:  05/07/2008  T:  05/08/2008  Job:  [id number]

## 2011-04-06 NOTE — Consult Note (Signed)
[name], [name] NO.:  [id number]   MEDICAL RECORD NO.:  [id number]          PATIENT TYPE:  INP   LOCATION:  5501                         FACILITY:  [facility]   PHYSICIAN:  Stefani Dama, M.D.  DATE OF BIRTH:  12-Apr-1928   DATE OF CONSULTATION:  11/22/2008  DATE OF DISCHARGE:                                 CONSULTATION   REQUESTING PHYSICIAN:  Kari Baars, MD   REASON FOR REQUEST:  Osteomyelitis L2-L3.   HISTORY OF PRESENT ILLNESS:  Mr. [name] is a 77 year old  gentleman who has had several back surgeries, two of them in the recent  past and this year, one apparently in March and one around July.  He  notes that after each surgery, he seemed to be getting better and he was  at [facility] for some postoperative recuperation and was becoming  increasingly independent when he got worsening back pain and then leg  pain, also he gets numbness in the both legs, worse on the right than on  the left, but the pain has become severe and unrelenting so much so that  pain medication is not helping control it.  He came to the hospital for  further workup and an MRI of the lumbar spine was performed which  demonstrates a destructive lesion at L2-L3, although it appears to be a  herniated nucleus pulposus.  The patient has evidence of a previous  laminectomy around this site and the findings on the MRI are suspicious  for an osteomyelitis and diskitis.  There is a gas-fluid level within  the disk space or the remnants of the disk space at this point.   The patient has not been febrile.  He denies any fevers or chills.  His  white count is normal.  His sedimentation rate is exceedingly low.  He  notes that he has good relief of his back pain symptoms with recumbency  and is fairly comfortable while lying down.  He has not had problems  with bowel or bladder control.  His other systems review are negative  except for the fact that he does have diabetes and has  been on insulin  for 15 years.  He notes his diabetes is typically well controlled.  When  he is not in the hospital, he uses insulin twice a day.  He otherwise  has some hypertension, and his past medical history and his systems  review, otherwise, yield no other pertinent information.   PHYSICAL EXAMINATION:  BACK:  Modest kyphos in the lumbar region in the  thoracolumbar junction.  There is no overt tenderness to palpation or  percussion.  He has a well-healed midline incision in his mid back.  NEUROLOGIC:  His motor function appears to be intact in iliopsoas,  quadriceps, tibialis anterior, and gastrocs with normal tone and bulk.  Deep tendon reflexes are 2+ in the patellae and 1+ in the Achilles.  Babinski's downgoing.  Sensation is intact throughout the distal lower  extremities to light touch.  Straight leg raising reproduces substantial  back pain at 45 degrees in either lower extremity.  Patrick maneuver is  negative.   IMPRESSION:  The patient has evidence of a destructive process at L2-L3.  In light of his normal white count and lack of any febrile response, I  suspect that this likely is a sterile process and may represent  osteonecrosis and sterile diskitis.  I indicated that the patient will  require surgical decompression of this level in order to get some relief  and also to make a definitive diagnosis at this time with him being  afebrile.  I believe that this can be done electively and will schedule  for Monday afternoon.  In the meantime, I would not want the patient to  be started on any antibiotics.  We will simply observe him, maintain on  bed rest until surgical intervention is undertaken.  I discussed the  surgery  with the patient and my concerns and addition to his potential for  healing, it is possible that the patient may need multiple surgeries and  unless we can get this process to heal, this may be a very difficult and  prolonged recuperative period for  him.  We will plan the surgery for  Monday afternoon.      Stefani Dama, M.D.  Electronically Signed     HJE/MEDQ  D:  11/22/2008  T:  11/22/2008  Job:  [id number]

## 2011-04-06 NOTE — H&P (Signed)
NAMEJABRIL, [name]              ACCOUNT NO.:  [id number]   MEDICAL RECORD NO.:  [id number]          PATIENT TYPE:  INP   LOCATION:  1615                         FACILITY:  [facility]   PHYSICIAN:  Marlowe Kays, M.D.  DATE OF BIRTH:  May 15, 1928   DATE OF ADMISSION:  05/07/2008  DATE OF DISCHARGE:                              HISTORY & PHYSICAL   CHIEF COMPLAINT:  Pain in my back and legs.   HISTORY OF PRESENT ILLNESS:  This 77 year old white male seen by [place] for  continued progressive problems concerning pain in his back.  The patient  underwent a microdiskectomy about 3 months ago at L2-L3 and did very  well postoperatively.  Over the months, he developed right leg pain and  MRI with gadolinium showed a huge amount of disk material extruding left  lateral recess and centrally.  It was felt that he did indeed have a  recurrent disk, and after much discussion, including the risks and  benefits of surgery, decided to go ahead with microdiskectomy at L2-L3.   PRIMARY CARE PHYSICIAN:  Dr. Creola Corn.   PAST MEDICAL HISTORY:  This gentleman been relatively good health  throughout his lifetime.  He has:  1. Type 2 diabetes.  2. Peripheral neuropathy.  3. Some coronary artery disease.   PAST SURGICAL HISTORY:  1. Microdiskectomy L2-L3 as mentioned above.  2. Hypertension.   ALLERGIES:  TETRACYCLINE AND QUINIDINE.   REVIEW OF SYSTEMS:  CNS: No seizures or paralysis, numbness or double  vision.  RESPIRATORY: No productive cough, no hemoptysis or shortness of  breath.  CARDIOVASCULAR:  No chest pain.  No angina or orthopnea.  Gastrointestinal:  No nausea, vomiting, melena or bloody stool.  GENITOURINARY:  No discharge, hematuria.  MUSCULOSKELETAL:  Primarily in  present illness.   SOCIAL HISTORY:  The patient is retired.  Neither smokes nor drinks.   PHYSICAL EXAMINATION:  GENERAL:  Alert, cooperative, friendly 79-year-  old white male in moderate distress.  VITAL SIGNS:  Blood  pressure 158/64, pulse 68, respirations 20,  temperature 97.7.  HEENT:  Normocephalic.  PERRLA.  EOM Intact.  Oropharynx is clear.  CHEST:  Clear on auscultation.  No rhonchi or rales.  HEART:  Regular rate and rhythm.  No murmurs are heard.  ABDOMEN:  Soft, nontender, spleen not felt.  GENITALIA:  Rectal not done, not pertinent to present illness.  EXTREMITIES:  Positive straight leg raise on the right.  Limitation  range of motion in the back secondary to spasms.   ADMISSION DIAGNOSIS:  1. Recurrent herniated nucleus pulposus L2-L3.  2. Hypertension.  3. Type 2 diabetes.  4. Peripheral neuropathy.  5. Coronary artery disease.   PLAN:  The patient will undergo microdiskectomy L2-L3.  We will  certainly ask Dr. Timothy Lasso to follow along with [place] during the patient's  hospitalization.      Dooley L. Cherlynn June.    ______________________________  Marlowe Kays, M.D.    DLU/MEDQ  D:  05/10/2008  T:  05/10/2008  Job:  [id number]   cc:   Marlowe Kays, M.D.  Fax: [phone]

## 2011-04-06 NOTE — Discharge Summary (Signed)
NAMECHEVEZ, SAMBRANO              ACCOUNT NO.:  [id number]   MEDICAL RECORD NO.:  [id number]          PATIENT TYPE:  INP   LOCATION:  1615                         [facility]ACILITY:  [facility]   PHYSICIAN:  Marlowe Kays, M.D.  DATE OF BIRTH:  1927-12-05   DATE OF ADMISSION:  05/07/2008  DATE OF DISCHARGE:  05/10/2008                               DISCHARGE SUMMARY   ADMISSION DIAGNOSES:  1. Recurrent herniated nucleus pulposus, L2-L3 on the left.  2. Diabetes mellitus, type 2.  3. Peripheral neuropathy.  4. Coronary artery disease.   DISCHARGE DIAGNOSES:  1. Recurrent herniated nucleus pulposus, L2-L3 on the left.  2. Diabetes mellitus, type 2.  3. Peripheral neuropathy.  4. Coronary artery disease.  5. Mild postoperative anemia.  6. Urinary retention postoperatively.   CONSULTATIONS:  Dr. Creola Corn, internal medicine.   OPERATION:  On May 07, 2008, the patient underwent microdiscectomy, L2-  L3 on the left, with removal of possible extruded disc fragments, as  well as microdiscectomy, L2-L3 interspace.  Dr. Ranee Gosselin assisted.   BRIEF HISTORY:  This gentleman had a prior microdiscectomy about three  months ago.  He did well immediately postoperatively, but did develop  progressive right-leg pain.  His pain prior to his past surgery was on  the left; this was on the right.  Lumbar myelogram with gadolinium shows  a huge amount of disc material extruded in the left lateral recess and  centrally.  After much discussion, including the risks and benefits of  the surgery, it was decided to go ahead with the above procedure.   COURSE IN THE HOSPITAL:  The patient tolerated surgical procedure quite  well.  Dr. Timothy Lasso, his family physician, saw the patient and continued to  follow him very closely throughout his hospitalization, adjusting his  medications and treatment program per need.  Orthopedically, the patient  did well postoperatively.  He had a minimal amount of drainage to  his  back.  He did have difficulty in voiding when we discontinued the  catheter.  He was noted to have urine retention on bladder scan.  Dr.  Timothy Lasso saw the patient on the anticipated day of discharge.  He  discontinued the Foley and will check him to see if he is continuing to  retain urine and, if he is clearing it, maybe he can go home.  This  discharge is based on that.  Orthopedically, he is ambulating in the  hall, doing quite well.   Postoperatively, the patient had little to no leg pain.   LABORATORY VALUES IN THE HOSPITAL:  Hematologically, essentially within  normal limits.  The patient did have a little bit of high sodium at 146.  Glucose was 107, BUN was 37.  Hemoglobin was 12, hematocrit was 34.9.  Electrocardiogram showed normal sinus rhythm.   CONDITION ON DISCHARGE:  Condition on anticipated discharge:  Improved,  stable.   PLAN:  If the patient voids and it is okay with Dr. Timothy Lasso, he will be  discharged home today.  We have arranged for home health.  This was  discussed at length with the  patient, concerning his home health and  physical therapy, versus skilled nursing.  Since he was doing so well,  ambulating in the hall, required few analgesics and had already had  physical therapy instructions from his rehabilitation stay at  [facility], he himself decided he would go home with home therapy.  I  discussed this with the nurse from [place].  He will have his needs with  the nurse for dressing changes and physical therapy for his home  therapy.   Dressings were sent home with the patient.  He is to continue with his  home medications and diet under the direction of Dr. Timothy Lasso.  Return to  see [place] about two weeks after the date of surgery.  Vicodin was given for  discomfort and Robaxin for muscle spasm.  He was urged to call should he  have any problems or questions.      Dooley L. Cherlynn June.    ______________________________  Marlowe Kays, M.D.     DLU/MEDQ  D:  05/10/2008  T:  05/10/2008  Job:  [id number]   cc:   Gwen Pounds, MD  Fax: [phone]

## 2011-04-06 NOTE — Discharge Summary (Signed)
NAMEJUDSON, [name] NO.:  [id number]   MEDICAL RECORD NO.:  [id number]          PATIENT TYPE:  INP   LOCATION:  3032                         FACILITY:  [facility]   PHYSICIAN:  Gwen Pounds, MD       DATE OF BIRTH:  [birth year]   DATE OF ADMISSION:  11/21/2008  DATE OF DISCHARGE:  11/29/2008                               DISCHARGE SUMMARY   DISCHARGE DIAGNOSES:  Include  1. Diskitis and osteomyelitis of L2-L3 creating kyphoscoliosis,      lumbar radiculopathy, and severe back pain with failure to thrive.  2. The patient is status post bilateral decompression of L2-L3 nerve      roots with bilateral diskectomy, pedicle screw fixation at L2-L3      with posterior lateral arthrodesis at L2-L3, posterior lumbar      interbody arthrodesis with PEEK spacers, local autograft and      allograft at L2-L3.  3. Deconditioning.  4. Type 2 diabetes mellitus.  5. Known peripheral neuropathy.  6. Coronary artery disease with five-vessel coronary artery bypass      grafting in 1998 and percutaneous intervention in 2001.  7. Hyperlipidemia.  8. Nephrolithiasis.  9. History of premature ventricular contractions.  10.History of anemia.  This was exacerbated by postoperative blood      loss of.  He required 2 units of packed red blood cells and      hemoglobin is now currently stable at 10.  11.History of pelvic fracture x2, to include pubic ramus fracture.  12.History of tonsillectomy and adenoidectomy.  13.History of hand surgery.  14.History of five lumbar back surgeries prior to this      hospitalization.  15.History of rotator cuff repair in 2005.  16.History of Clostridium difficile colitis in September 2005.  17.History of diverticulosis.  18.History of hemorrhoids.  19.Constipation relieved with Fleet's enema.   ALLERGIES:  INCLUDE TETRACYCLINE QUINIDINE, LONG-ACTING MORPHINE AND  PHENTANYL.   MEDICATION LIST:  Includes.  1. Incentive spirometer q.1h. while awake for  the next 2 weeks.  2. Neurontin 600 mg p.o. q.6h.  3. Lidoderm patch 5% on 12 hours off 12 hours to affected areas use as      needed for neuropathic pain.  4. Tricor 145 mg p.o. daily.  5. Vitamin E 400 units p.o. daily.  6. Multivitamin one p.o. daily.  7. Lantus 40 Units at bedtime.  8. Diclofenac 75 mg p.o. twice a day.  9. Humalog sliding scale insulin moderate resistance.  10.Prilosec 20 mg p.o. daily.  11.Simvastatin 40 mg p.o. daily.  12.NitroQuick 0.4 mg sublingual p.r.n. chest pain.  13.Atenolol 25 mg 1/2 p.o. daily.  14.Lisinopril 20 mg p.o. b.i.d.  15.Darvocet N 100 one to 2 tablets p.o. q.6h. p.r.n. pain.  16.Senokot-S 2 tablets p.o. daily p.r.n.  17.Colace daily.  18.Flexeril 5 mg to 10 mg p.o. q.12h. p.r.n. for muscle spasm.    1. Dressing changes to back daily including painting the incision with      Betadine until healed.  2. [employer] brace when ambulating.   DISCHARGE PROCEDURES:  Include  1. L2-L3 operation on November 25, 2008.  2. Consultation with Dr. Barnett Abu and consultation with Dr. Venita Lick.  3. Medical management.   HISTORY OF PRESENT ILLNESS:  Briefly, [name] was sent  to the emergency department on November 21, 2008 with progressive and  severe lower back pain, new left leg radiculopathy, failure to thrive,  poor function, and inability to get out of bed.  He had extensive lower  back issues in the past and had been managed with pain control per Dr.  Vear Clock.  He has had multiple operations as well as listed above in the  diagnosis section.  I saw the patient that night and admitted him to my  service through the [facility] emergency department.  Over the next day  and a half he was seen by Dr. Venita Lick, who recommended getting an  MRI and labs.  The MRI was done and showed a destructive process at L2-  L3 with central disk extrusion to the left.  Findings were consistent  with L2-L3 diskitis and  a possible epidural abscess.  There was also  noted severe spinal stenosis at the L2-L3 level.  The patient requested  that Dr. Venita Lick not operate on him, and the patient wanted to be  seen by neurosurgery.  This was arranged through Dr. Barnett Abu.  Dr.  Danielle Dess did not want antibiotics started until after the surgery was  done.  He thought this might be more of a sterile discitis.  His CBC was  normal, including white count was normal. Sed rate and CRP were normal,  and the patient was afebrile.  The patient went to surgery on January 4  and did very well.  The cultures came back negative.  Gram stains came  back negative. Smears came back negative, so it was proven that this was  a sterile diskitis.  The patient's pain went away almost immediately  after surgery.  The PCA caused some underlying confusion and had to be  stopped pretty quickly. He was maintained on Darvocet, and his mental  state slowly improved over the next couple of days.  He got daily  dressing changes to the back as ordered by Dr. Danielle Dess. And he started  working with physical and occupational therapy.  He did have problems  with narcotics, causing worsening constipation and required a Fleets  enema. This was successful as stated above.   For the L2-L3 diskitis he is status post bilateral diskectomy,  decompression and screw fixation and fusion. Now deconditioned requiring  aggressive physical and occupational therapy to restore him to quality  of life.  When it is deemed appropriate he will be transferred to  [facility] to complete his physical therapy.   For his diabetes mellitus initially was okay. As he started eating his  blood sugars went up.  He required more insulin, and this will need to  continue to be adjusted with four times a day blood sugar checks and  adjustment of insulin dosing.   His blood pressures remained appropriate.   He remained on DVT prophylaxis initially with  Lovenox and then with  squeezers and early mobilization.   For postop blood loss anemia he got transfused 2 units of packed red  blood cells, and his hemoglobins have remained stable over the last two,  and his hemoglobin is 10 on November 29, 2008.   FL-2 has been signed.   On  November 29, 2008 the patient had no complaints. He had a bowel  movement yesterday and was in no pain.  He still had not walked, but he  had been turning and working with physical therapy, working on  transfers.  All of vital signs were stable.  Blood pressure is 146/77.  The nurse was concerned about hypoxia.  We put the pulse ox machine on  him.  The initial sat was 83% and over the next 30 seconds the sats went  up to 94%, and that is where they stayed. His blood sugars were 204,  251, 258, and 247.  The rest of his physical exam showed less confusion,  moving a little bit easier, and no other changes on his physical exam.  The only two issues that are currently remaining are the low sats, but  the lungs are clear.  He has no shortness of breath, and the sats do  pick up better after leaving the sat monitor on. I do not see any  underlying lower extremity edema. I do not have a high suspicion of DVT  or PE. I just think that the sat monitor is not picking up  appropriately. Will start incentive spirometry q.1h. in one week and try  to get him again more mobile.  The second issue is he has not moved well  with physical therapy as stated above. I will have them see him this  morning prior to going to [facility]. If PCP thinks he needs to stick  around over the weekend and get more acute care physical therapy, I will  be happy to entertained this, but at this point I think that the  physical therapy will be more aggressive at [facility]  and would try to  anticipate getting him there sooner rather than later. The other blood  work that had today came back with fecal occult blood negative and again  all recent  cultures are negative.  His BUN was 20 and creatinine 0.99  when it was last checked yesterday.  Dr. Danielle Dess saw the patient today at  6:30 in the morning.  X-ray last night showed good fixation and  alignment, and motor function was okay.  He plans to mobilize the  patient with getting him out of bed and continuing the dressing changes  daily and continue supportive care.  Will check back with the nurses  later today,and if he is capable will work on getting him discharged.      Gwen Pounds, MD  Electronically Signed     JMR/MEDQ  D:  11/29/2008  T:  11/29/2008  Job:  [id number]   cc:   Gwen Pounds, MD  Stefani Dama, M.D.  Marlowe Kays, M.D.

## 2011-04-09 NOTE — Procedures (Signed)
EEG NUMBER:  [id number].   CLINICAL HISTORY:  The patient is a 78 year old with confusion and  hypertension.  Study is being done to look for the presence of seizures  (293.0).   PROCEDURE:  The tracing is carried out on a 32-channel digital Cadwell  recorder reformatted to 16-channel montages with one devoted to EKG.  The  patient was awake and drowsy during the recording.  The [employer] 10/20  system lead placement was used.   MEDICATIONS:  Insulin.   DESCRIPTION OF FINDINGS:  Dominant frequency is a 9 Hz 20 microvolt activity  that is well regulated.  Associated with this is frontally predominant beta  range activity.   The patient becomes drowsy with mixed frequency theta and upper delta range  activity.  This is not associated with vertex sharp waves or symmetric and  synchronous sleep spindles.   There was no focal slowing.  There was no interictal epileptiform activity  in the form of spikes or sharp waves.  EKG showed a regular sinus rhythm  with ventricular response of 66 beats per minute.   IMPRESSION:  Normal record with the patient awake and drowsy.      Deanna Artis. Sharene Skeans, M.D.  Electronically Signed     ZOX:WRUE  D:  08/16/2006 12:56:54  T:  08/18/2006 10:37:02  Job #:  [id number]   cc:   Jeannett Senior A. Evlyn Kanner, M.D.  Fax: [phone]   Bevelyn Buckles. Nash Shearer, M.D.  Fax: [phone]

## 2011-04-09 NOTE — H&P (Signed)
NAMELEONIDES, MINDER              ACCOUNT NO.:  [id number]   MEDICAL RECORD NO.:  [id number]          PATIENT TYPE:  INP   LOCATION:  1406                         FACILITY:  [facility]   PHYSICIAN:  Tera Mater. Evlyn Kanner, M.D. DATE OF BIRTH:  11/19/1928   DATE OF ADMISSION:  08/13/2006  DATE OF DISCHARGE:                                HISTORY & PHYSICAL   Mr. [name] is a 77 year old white male with a history of type 2 diabetes and  coronary disease who presents with confusion.  He was found by his family  this morning sitting in a chair and confused.  They thought he might have  had a low blood sugar, and they treated him with some candy, and when EMS  got there, his sugar was 283.  There was no other evidence of a hypoglycemic  episode at this time.  The patient has partially cleared while here, and it  appears he had been awake all night.  He was sitting in a chair with the  lights on in his house.  He has not had any prior situations like this.  He  had been doing actually quite well other than a twist of his leg this past  Thursday with hip pain since then.  He had suffered a pelvic fracture in  May.  This had healed up prior to this.  At the present time, he denies any  chest pain or breathing trouble.  He has had no headache or visual  complaints.  He does have some constipation.  He is mostly cleared but does  still have some word-finding issues.   PAST MEDICAL HISTORY:  Coronary disease with bypass in 1998, stent in 2001.  He has had a catheter in 2001, 2003 and 2004.  He has had a history of  congestive heart failure at least one time in the past.  History of type 2  diabetes dating back many years with insulin for most of this time.  History  of hyperlipidemia, hypertension, nephrolithiasis, ischial pubic ramus  fractures.  He has had four lumbar laminectomies in 1996, 1997, 1998 and L3.  Tonsils were out in 1941.  Hand surgery was done many years ago and had a  rotator cuff tear  without surgery in 2005.   ALLERGIES:  TETRACYCLINE, QUINIDINE.   FAMILY HISTORY:  Positive for diabetes in an aunt.   SOCIAL HISTORY:  He is married.  A retired Acupuncturist.  He has  three children.   MEDICATIONS:  Atenolol, dose uncertain.  Voltaren, dose uncertain.  Neurontin, Darvocet, Tricor 145, Lantus 40 at bed, and Humalog at meals.   PHYSICAL EXAMINATION:  VITAL SIGNS:  Here in the emergency room, blood  pressure 157/56, pulse 95, respirations 16, temperature 98.5.  O2 sats are  100% on room air. Marland Kitchen  GENERAL:  We have a healthy, well-nourished-appearing white male sitting up  in no distress.  He is nontoxic-appearing.  HEENT:  Sclerae are anicteric.  Face is symmetric.  Extraocular movements  are intact without nystagmus.  Pupils are equal and reactive bilaterally.  Disks appear generally sharp.  Face appears symmetrical.  Mucous membranes  are moist.  There is no evidence of tongue trauma or intraoral trauma.  NECK:  Supple without bruits or JVD.  LUNGS:  Clear.  HEART:  Regular and distant with a soft systolic murmur.  I do not hear any  S3.  ABDOMEN:  Soft, nontender without masses.  EXTREMITIES:  Trace edema, relatively diminished pulses.  He has pain with  any external rotation of the right leg.  NEUROLOGIC:  He is awake.  He is alert.  He has a slight hesitancy of  speech.  He is oriented to the location being [facility], [place], the situation, the date.  He has a memory of the  events leading up to this.  He has no resting tremor.  His finger-to-nose  appears intact.  No pronator drift is present.  He does not appear to have  any motor deficits.   LABORATORY DATA:  White count is 17,000, hemoglobin 13.3, MCV 99, platelets  195,000.  Sodium 139, potassium 4.1, chloride 109, CO2 28, BUN 28,  creatinine 1, glucose 363.   EKG is sinus rhythm without ischemic changes.   Total bili is 1.8.  AST is 44, ALT is 24.  Albumin is 2.1.   Troponin is  0.05.  Myoglobin point-of-care is greater than 500.  Urine is large blood  with no cells, greater than 100,000 mg% glucose.   In summary, we have a 78 year old white male with a semi-acute confusional  episode that is partially cleared.  His CT has been nonrevealing, and MRI is  planned for the next few minutes here.  This may have been the sequelae of a  significant hypo-ischemic event, but I am doubtful that this is the cause.  This seems more like a small stroke or a left hemispheric episode of some  sort.  The reason for the elevation in his white count is not clear, but he  is not clinically toxic.  He has no tachycardia, no pulmonary symptoms.  The  urine does not show any infection.  His chest is clear.  I think we can  watch without coverage at the moment.  It could be stress related.  With the  elevated myoglobin, we will add a CPK with MB just to make sure that we do  not have a rhabdomyolysis situation.  Neurology has already seen him and  recommended an aspirin as our intervention at the present.  Fortunately, the  patient does seem to be getting clinically better.           ______________________________  Tera Mater Evlyn Kanner, M.D.     SAS/MEDQ  D:  08/13/2006  T:  08/16/2006  Job:  [id number]   cc:   Vesta Mixer, M.D.  Fax: [phone]

## 2011-04-09 NOTE — Cardiovascular Report (Signed)
NAME:  [name], SALEK NO.:  [id number]   MEDICAL RECORD NO.:  [id number]                   PATIENT TYPE:  OIB   LOCATION:  2899                                 FACILITY:  [facility]   PHYSICIAN:  Vesta Mixer, M.D.              DATE OF BIRTH:  12-Jun-1928   DATE OF PROCEDURE:  07/18/2002  DATE OF DISCHARGE:  07/18/2002                              CARDIAC CATHETERIZATION   INDICATIONS FOR PROCEDURE:  The patient is a 77 year old gentleman with a  history of coronary artery disease status post coronary artery bypass  grafting.  He has had some chest pain in the past and had an abnormal  Cardiolite study in the past.  He did not want to have heart catheterization  because of his wife's illness.  He presented now with some worsening  episodes of chest pain and would like to have a heart catheterization.   PROCEDURE:  Left heart catheterization with coronary angiography and left  ventriculography.   DESCRIPTION OF PROCEDURE:  The right femoral artery was easily cannulated  using the modified Seldinger technique.   HEMODYNAMICS:  1. The left ventricular pressure is 156/23.  2. His aortic pressure is 156/57.   ANGIOGRAPHY:  1. The left main coronary artery is normal in the proximal segment.  The     distal left main has a 60-70% stenosis at the bifurcation.  2. The left anterior descending artery is occluded proximally.  3. The left circumflex artery has a 70% stenosis at its origin.  The     proximal stent has a moderate 40-50% stenosis.  There are minor luminal     irregularities in the remainder of the circumflex artery.  4. The right coronary artery is small and is codominant.  There is a 50-60%     stenosis in the mid vessel.  The vessel is only 1 to 1.5 mm in size at     this point.  There is a very small RV marginal branch that has a subtotal     occlusion.  This vessel would not be a candidate for angioplasty at all.  5. The saphenous vein  graft to the diagonal vessel is smooth and is     otherwise normal.  The anastomosis is normal.  The __________ is normal.  6. The saphenous vein graft to the obtuse marginal artery is occluded.  7. The saphenous vein graft to the posterolateral segment artery is     occluded.  8. The left internal mammary artery was normal.  The anastomosis was normal.  9. The left anterior descending artery had mild moderate irregularities.     There was one 30-40% stenosis in the distal LAD.   LEFT VENTRICULOGRAM:  Left ventriculogram was performed in the 30-degree RAO  position.  It reveals mildly depressed left ventricular systolic function  with mild left ventricular enlargement.  The ejection fraction is around 40-  45%.   COMPLICATIONS:  None.   CONCLUSION:  Severe native vessel coronary artery disease.  He has occlusion  of the saphenous vein grafts of the left circumflex artery and the  posterolateral branch.  His native circumflex artery has moderate  irregularities.  The  left main does have a 70% stenosis.  We will continue with medical therapy  given the diffuseness of this disease.  I do not think that further stenting  or angioplasty of the existing stent would be of significant benefit.  We  will continue with medical therapy.                                               Vesta Mixer, M.D.    PJN/MEDQ  D:  07/18/2002  T:  07/18/2002  Job:  [id number]   cc:   Cardiac Catheterization Lab   Vesta Mixer, M.D.   Redge Gainer. Perini, M.D.

## 2011-04-09 NOTE — Discharge Summary (Signed)
NAMEGENTLE, HOGE              ACCOUNT NO.:  [id number]   MEDICAL RECORD NO.:  [id number]          PATIENT TYPE:  INP   LOCATION:  1406                         FACILITY:  [facility]   PHYSICIAN:  Gwen Pounds, MD       DATE OF BIRTH:  1928/11/06   DATE OF ADMISSION:  08/13/2006  DATE OF DISCHARGE:                                 DISCHARGE SUMMARY   PRIMARY CARE PHYSICIAN:  Dr. Timothy Lasso.   CONSULTING NEUROLOGIST:  Dr. Nash Shearer.   CONSULTING CARDIOLOGIST:  Dr. Elease Hashimoto or Dr. Deborah Chalk.   NEW DIAGNOSES:  1. Confusion and altered mental state, negative neurologic workup,      presumed secondary to changes in glycemic control and potential      medication issues, now resolved.  2. Elevated CPK levels deemed not cardiac.  3. Recurrent pubic rami fractures in the right superior and inferior pubic      rami.  Subacute fracture of the right sacrum.  No evidence of      pathologic fracture.  Evaluated by Dr. Debby Bud in orthopedic surgery.      The patient with significant pain and dysfunction due to this.  4. Failure to thrive secondary to pubic rami fracture and pain.   DISCHARGE DIAGNOSES:  1. Coronary artery disease status post coronary artery bypass grafting in      1998, coronary stents in 2001, history of congestive heart failure.  2. History of diabetes mellitus.  3. Hyperlipidemia.  4. Hypertension.  5. Nephrolithiasis.  6. History of ischial pubic ramus fractures.  7. History of 4 lumbar laminectomies in 1996, 1997. 1998.  8. Tonsil and adenoidectomy.  9. History of hand surgery.  10.Rotator cuff repair without surgery in 2005.  11.Leukocytosis of unknown etiology, on empiric antibiotic treatment.   MEDICATION LIST:  1. Aspirin 325 mg p.o. daily.  2. Atenolol 50 mg p.o. daily.  3. Ciprofloxacin 500 mg p.o. b.i.d. with last dose at the end of the day      on September 28.  4. Neurontin 300 mg p.o. q.h.s.  5. Lantus 55 units q.h.s., 20 units q.a.m. with a NovoLog sliding scale.  6. Protonix 40 mg p.o. daily.  7. Altace 10 mg p.o. daily.  8. Senokot-S 2 tablets p.o. q.h.s.  9. Vicodin 5/500 1-2 tablets p.o. q.4-6h p.r.n. pain.  10.Tricor 145 mg p.o. daily.   PROCEDURES:  1. EET dated September 25 revealing normal record with the patient awake      and drowsy.  2. CT scan of the lumbar spine and pelvis revealing subacute fracture of      the right sacrum and right superior and inferior pubic rami without      findings of pathologic fracture and chronic _________ in his lumbar      spine related to prior surgeries.  3. Right hip x-ray revealing fractures of the right pubis initium.  4. MR of the brain shows mild chronic small vessel ischemic changes.  The      MRA showed mild intra-cranial atherosclerotic disease in medium size      vessels, no large vessel  occlusion or aneurysm identified.  5. Cranial CT done on September 22 showed no acute intracranial pathology.  6. Chest x-ray done on September 22 showed no active cardiopulmonary      disease.  7. 2D echo on September 24 revealed left ventricular ejection fraction of      50-55% with left ventricular wall thickness mild to moderately      increased.   HISTORY OF PRESENT ILLNESS:  Briefly, Mr. Brenyn Petrey is a 77 year old  white male with history of type II diabetes mellitus and coronary artery  disease who was found by his family on the day of admission sitting in his  chair and confused.  There was a question of low blood sugar and he was  treated by his family with some candy and by the time his blood sugar was  checked it was 283.  By the time he was brought to the emergency room his  mental status was improving.  Of note, he reported that he twisted his leg  and has had worsening hip pain for about 2-3 days prior to admission.  There  was no chest pain, shortness of breath or any other symptoms.  He was seen  and evaluated in the emergency room.  Labs were relatively unremarkable, but  his myoglobin  was elevated.  His CK was elevated.  He had a slightly high  Troponin and he had glucosuria.  He was admitted for further evaluation and  treatment.   HOSPITAL COURSE:  Mr. Pearse Shiffler was admitted through the emergency  department after a confusional state and with hip pain and with elevated CK  levels and elevated white blood cell count.  1. Confusional state.  Neurology was consulted and they reported      conditions of altered mental state, confusion and speech difficulty.      Cranial CT was reviewed that showed no acute intracranial abnormality.      MRI, MRA and EEG were all ordered and these were unremarkable.  Initial      thought was that potentially he had a stroke.  This turned out to be      negative.  Most likely cause is that due to immobility and taking lots      of insulin at home, he may have developed hypoglycemia which caused an      encephalopathic process when his family found him.  Over the course of      the hospital stay he is 100% back to his baseline.  He is alert and      oriented x3 and he is back to himself.  Matter of fact, the patient      made a comment today that yesterday when Dr. Nash Shearer visited him, he      was able to spell the word world backward for the first time and made      Dr. Nash Shearer laugh.  Workup for this was obviously negative and no      further workup was needed.  2. For the elevated CK and Troponins, cardiology was consulted.  A 2D echo      was performed.  EKGs were reviewed and it was determined that because      the CK levels were 1199, CK MB was only 6.7, and despite the fact that      the Troponins were slightly elevated, it did not matter.  The peak      Troponin I level was 0.09.  This was  a non-cardiac source of elevated      CK levels, probably presumed due to the pelvic injury, and on CT scan      of the pelvis it actually did show a hematoma into his leg muscles.  No      further cardiac workup was indicated. 3. The  patient did have some leukocytosis and this was never figured out.      Urinalysis was relatively unremarkable.  Chest x-ray was unremarkable      and he never did have signs of symptoms of any underlying infection,      but his white count was 17,000 on admission.  Dr. Evlyn Kanner put him on      empiric Cipro.  He will finish a full 5-day course of treatment and      then discontinue and follow up carefully.  4. It was determined that the injury that he sustained on the Thursday      prior to admission was 2 further fractures in his pelvis.  For this      reason physical therapy, occupational therapy and Dr. Debby Bud were      brought in.  A CT scan was done to rule out pathologic fractures.      There was no pathologic fracture.  Physical therapy was actually quite      impressed with his progress, but it was still felt that he would not be      able to go home in his current situation and current state and he would      not be able to climb stairs at this current time, and that he would      need short-term rehabilitation.  It was determined that after lots of      thought, that [facility] would be the perfect      place for him to complete his rehabilitation prior to going back home      and taking care of himself.  He will get physical therapy and      occupational therapy there.  5. A lipid profile was ordered.  Total cholesterol 153, triglycerides      224, HDL 29, LDL 79.  Tricor was initially held because of the elevated      CK levels.  These will be restarted and just note to the physician      taking care of the patient to follow a repeat CK and a CMET in the next      several days.  6. His diabetes was actually very poorly controlled while in the hospital.      His blood sugars remained mostly in the 200-300 range.  His A1c if I      remember correctly is the low to mid 7's in the office.  I have      actually increased his insulin quite a lot while there and  he is on a      sliding scale insulin.  My hope is that when he gets more active his      blood sugars will come down and we can titrate his medications      accordingly.  7. For the pelvic bone fracture, Dr. Evlyn Kanner ordered a 25 hydroxy vitamin D      to see if he was in vitamin D deficiency.  The patient was not.  His      Vitamin D level was 29 and the reference range in the hospital is  20-      57.  8. He remained a little hypertensive in the hospital and his Altace needed      to be increased.   IMPORTANT LABS:  On September 25, sodium was 134, potassium 4.0.  The rest  of the CMET was within normal limits except for an elevated glucose of 231,  BUN at 25, and albumin 2.4.  Also on September 25, his white count had  dropped to 9.7.  His hemoglobin was 12.8 and platelet count was 235.  The  patient's last CK level was 87 on September 24 which now allows me to  restart his Tricor.   Mr. Tullier will be discharged in stable condition on August 18, 2006 to [place] for rehabilitation.  His last set of vitals were 98.4, pulse 57,  respiratory rate 18, blood pressure 164/70, sating 97% on room air, and last  blood sugar was 248 last night.  After he follows the rules and regulations  and gets his rehabilitation he will follow up with Dr. Timothy Lasso and [facility].   DIET:  Heart healthy, carbohydrate reduced without concentrated sweets diet.   ACTIVITY:  Work with physical therapy and occupational therapy.      Gwen Pounds, MD  Electronically Signed     JMR/MEDQ  D:  08/18/2006  T:  08/18/2006  Job:  [id number]   cc:   Bevelyn Buckles. Nash Shearer, M.D.  Fax: [phone]   Vesta Mixer, M.D.  Fax: [phone]

## 2011-04-09 NOTE — Consult Note (Signed)
NAME:  [name], [name] NO.:  [id number]   MEDICAL RECORD NO.:  [id number]          PATIENT TYPE:  EMS   LOCATION:  ED                           FACILITY:  [facility]   PHYSICIAN:  Bevelyn Buckles. Champey, M.D.DATE OF BIRTH:  [birth year]   DATE OF CONSULTATION:  08/13/2006  DATE OF DISCHARGE:                                   CONSULTATION   REASON FOR CONSULTATION:  Altered mental status, confusion, and speech  difficulty.   HISTORY OF PRESENT ILLNESS:  Mr. Messer is a 77 year old Caucasian male with  multiple medical problems who presents to the emergency department after a 1  to 2 day history of confusion and speech difficulty. The patient was in his  normal state of health until yesterday at unknown onset time, where patient  developed confusion and difficulty expressing words. The patient states  initially was on responding yes to no to conversation. The patient states  that he knew what he wanted to say, however, could not get words out, that  he was trying express. He denied any problems with comprehension. The  patient's symptoms variably improved today to where he is almost back to  normal, although he is still having some difficulty finding words and  expressing them. The patient denied any difficulty with headaches during  this time. He did feel a little bit unsteady on his feet, stating that he  was falling to the right, as he felt weak on the right side. However, did  have a recent right lower extremity injury where it is sore and painful. He  denies any numbness, tingling, vision changes, swallowing problems, chewing  problems, dizziness, vertigo, or loss of consciousness.   PAST MEDICAL HISTORY:  Positive for diabetes, hypertension, coronary artery  disease status post CABG, hyperlipidemia, CHF.   CURRENT MEDICATIONS:  Include atenolol, Voltaren, Tricor, Lasix, Neurontin,  and insulin.   ALLERGIES:  TETRACYCLINE.   FAMILY HISTORY:  Positive for heart  disease, hypertension.   SOCIAL HISTORY:  The patient lives alone. No tobacco or alcohol use.   REVIEW OF SYSTEMS:  Positive as per HPI. Negative as per HPI and greater  than 7 other organ systems.   PHYSICAL EXAMINATION:  VITAL SIGNS:  Temperature 98.5, blood pressure  157/56, pulse 95, respiratory rate 16, O2 saturation 100%.  HEENT:  Normocephalic and atraumatic. Extraocular muscles intact. Pupils are  equal, round, and reactive to light.  NECK:  Supple. No carotid bruits.  HEART:  Regular.  LUNGS:  Clear.  ABDOMEN:  Soft, nontender.  EXTREMITIES:  No edema with good pulses. The patient does have right lower  extremity pain and soreness secondary to recent injury.  NEUROLOGIC:  The patient is awake, alert, and oriented x2, not to year. He  is oriented also to name, place, city, and county. The patient has good  naming, repetition and reading. The patient has 3 out of 3 registration out  of 5 serial 7's and follows 3 step command. Cranial nerves 2-12 are grossly  intact. Motor examination, right lower extremity is limited secondary to  discomfort and soreness from injury. It is  about 4 out of 5 strength  proximally. Otherwise, the patient has 4+ to 5 out of 5 strength and normal  tone throughout. Sensory examination is within normal limits. Light touch  reflexes are trace throughout. Toes are neutral bilaterally. Cerebellar  function is within normal limits finger to nose. Gait is not assessed  secondary to safety.   LABORATORY DATA:  WBC 17.0. Hemoglobin and hematocrit 13.3 and 38.7.  Platelets 195,000. Sodium 139, potassium 4.1, chloride 102, CO2 28, BUN 28,  creatinine 1.0. Glucose is 363. AST is 44, ALT is 26. Calcium 9.1. Myoglobin  is greater than 500. UA shows specific gravity of 1.039. Glucose is greater  than 1,000, large blood, and protein was 100.   CT scan of the head showed no acute abnormality.   IMPRESSION:  This is a 77 year old Caucasian male with multiple  medical  problems who presents with confusion, speech difficulty and right sided  weakness (old injury) with unsteadiness on his feet. Concerned with multiple  risk factors, that the patient possibly could have a cerebrovascular  accident, as symptoms are suggestive and localized to the left hemisphere.  Recommend getting a MRI/MRA of the brain for further evaluation. If the  patient does have a positive stroke, I would recommend a 2-D echocardiogram  and carotid Doppler's. We will check a lipid and homocystine level at this  time. Another possible etiology is hyper or hypoglycemia, as the patient had  episodes in the past of hypoglycemia, or possible infection with elevated  WBC's. His mental status has greatly improved, as he is almost back to  baseline. However, still having some difficulty expressing occasional speech  and hesitant with his speech. We will order an EEG for Monday to check his  confusion and speech difficulty as well. Will follow the patient while he is  in the hospital.      Bevelyn Buckles. Nash Shearer, M.D.  Electronically Signed     DRC/MEDQ  D:  08/13/2006  T:  08/16/2006  Job:  [id number]

## 2011-04-09 NOTE — Op Note (Signed)
[place]  Patient:    [name], [name]                     MRN: [id number] Proc. Date: 03/09/00 Adm. Date:  [id number] Disc. Date: [id number] Attending:  Koren Bound CC:         Cardiac Catheterization Laboratory                           Operative Report  INDICATIONS:  [name] is a 77 year old gentleman with a history of coronary artery disease, status post coronary artery bypass grafting.  He recently presented with dyspnea on exertion.  An adenosine Cardiolite study found ischemia in the inferolateral distribution.  He is referred for cardiac catheterization.  The right femoral artery was easily cannulated using a modified Seldinger technique.  HEMODYNAMICS:  The LV pressure was 159/25 with an aortic pressure of 159/64.  ANGIOGRAPHY:  The left main coronary artery has minor luminal irregularities but otherwise no stenosis.  The left anterior descending artery gives off a small diagonal branch and is then occluded.  The left circumflex artery has a 30-40% stenosis at the ostium.  This is followed by a 90-95% stenosis in the proximal segment.  The left circumflex artery continues around.  It does not give off any significant large marginal arteries in the proximal half.  It gives off multiple small posterolateral branches toward the distal aspect of the vessel.  A second or third obtuse marginal artery can be seen filling faintly via left to left collaterals.  The right coronary artery is a small and nondominant vessel.  The mid vessel is  severely diffusely diseased.  The saphenous vein graft to the first diagonal system is a nice graft and is widely patent.  The anastomosis is normal.  The diagonal vessel has only minor luminal  irregularities but no critical stenosis.  The saphenous vein graft to the posterolateral branch of the left circumflex artery is occluded proximally.  We were able to find a small  ______ of the saphenous vein graft.  The saphenous vein graft to the first and second obtuse marginal arteries is occluded at the aorta as well.  We were able to visualize a small ______ but no  evidence of the graft.  The left internal mammary artery was found to be widely patent.  The LIMA is large and patent throughout its course and the anastomosis is normal.  The LAD has minor luminal irregularities but no discrete stenosis.  LEFT VENTRICULOGRAM:  The left ventriculogram was performed in a 30 RAO position. It reveals overall normal left ventricular systolic function.  The ejection fraction is approximately 65-70%.  There is a mild amount of mitral regurgitation.  PERCUTANEOUS TRANSLUMINAL CORONARY ANGIOPLASTY:  The left main was engaged using an 8 [place] Judkins left four guide.  Heparin 5000 units were given as well as a double bolus Integrilin drip.  A traverse 0.014 angioplasty wire was used to wire the left circumflex artery.  This was followed by a 3.0 x 15 mm Quantam Monorail. This balloon was inflated to 12 atmospheres for 63 seconds in the proximal left  circumflex artery.  This resulted in a marked improvement of the vessel lumen. It was clear that the patient still had some residual stenoses and would require stenting.  At this point a 3.0 x 15 mm NIR Elite balloon was placed across the stenosis.  It was  employed at 14 atmospheres for a total of 60 seconds.  This resulted in a nice angiographic lumen with no luminal irregularities.  The patient still has a proximal stenosis but this does not appear to be critical and it does not appear to be flow-limiting.  COMPLICATIONS:  None.  CONCLUSIONS: 1. Severe native coronary artery disease. 2. Successful percutaneous transluminal coronary angioplasty and stenting of    the proximal left circumflex artery. 3. Occluded saphenous vein grafts to the posterolateral segment arteries and to  the first and second  obtuse marginal arteries. 4. Well-preserved left ventricular systolic function. DD:  03/09/00 TD:  03/10/00 Job: [id number]

## 2011-04-09 NOTE — Discharge Summary (Signed)
[place]. [facility]  Patient:    [name], [name]                     MRN: [id number] Adm. Date:  [id number] Disc. Date: [id number] Attending:  Koren Bound CC:         Jonelle Sports. Cheryll Cockayne, M.D.                           Discharge Summary  DISCHARGE DIAGNOSES:  1. Coronary artery disease, status post percutaneous transluminal coronary     angioplasty and stenting of the left circumflex artery.  2. Status post coronary artery bypass grafting with subsequent occlusion     of saphenous vein graft to the first and second obtuse marginal arteries.  3. Dyslipidemia.  4. Diabetes mellitus.  DISCHARGE MEDICATIONS:  1. Enteric-coated aspirin 325 mg q.d.  2. Plavix 75 mg q.d. for 28 days.  3. Insulin as directed by Dr. Cheryll Cockayne.  4. Neurontin 300 mg t.i.d.  5. Tricor 200 mg q.d.  6. Ultram 50 mg b.i.d.  7. Voltaren 75 mg b.i.d.  8. Atenolol 25 mg q.d.  DISPOSITION: The patient will return to see Dr. Elease Hashimoto in approximately one week.  He is to call sooner if he has any problems.  He is to follow up with Dr. Cheryll Cockayne for management of his diabetes.  HISTORY OF PRESENT ILLNESS: [name] is a 77 year old gentleman with a history of coronary artery disease.  He was recently seen in the office for dyspnea on exertion.  An adenosine Cardiolite study revealed inferolateral ischemia and he was referred for heart catheterization for further evaluation.  HOSPITAL COURSE: The patient underwent heart catheterization on March 09, 2000 and was found to have severe native coronary artery disease involving an occluded LAD, 90% proximal left circumflex lesion, and a severely diseased but small right coronary artery.  The left internal mammary artery to his LAD was widely patent.  The saphenous vein graft to the diagonal branch was large and patent.  The saphenous vein graft to the first and second obtuse marginal arteries was occluded, and the saphenous vein graft to  the posterolateral branch was also occluded.  He underwent successful PTCA and stenting of the proximal left circumflex artery.  He tolerated the procedure quite well. Following the procedure he did have some T wave inversions in the lateral leads but did not have any significant clinical problems.  He was maintained on integrelin for the next 18 hours.  He did have some slight bleeding but no serious hemorrhaging.  The patient is discharged in satisfactory condition. He will see Dr. Elease Hashimoto in one week.  All of his other medical problems were stable. DD:  03/10/00 TD:  03/10/00 Job: [id number]

## 2011-04-09 NOTE — H&P (Signed)
NAME:  [name], [name] NO.:  [id number]   MEDICAL RECORD NO.:  [id number]                   PATIENT TYPE:  OIB   LOCATION:  NA                                   FACILITY:  [facility]   PHYSICIAN:  Vesta Mixer, M.D.              DATE OF BIRTH:  [birth year]   DATE OF ADMISSION:  10/01/2003  DATE OF DISCHARGE:                                HISTORY & PHYSICAL   HISTORY OF PRESENT ILLNESS:  The patient is an elderly gentleman with a  history of coronary artery disease.  He is status post coronary artery  bypass grafting.  He is also status post PTCA and stenting of the left  circumflex artery.  He recently has had some episodes of chest pain and  shortness of breath.  A stress Cardiolite study performed in the office  revealed inferior lateral ischemia.  He is asked to present for heart  catheterization based on these results.   The patient has continued to have some episodes of chest pain and some  shortness of breath.  He has not had any syncope or presyncope.  He has not  had to take any nitroglycerin.   CURRENT MEDICATIONS:  1. Atenolol 12.5 mg a day.  2. Tricor 200 mg a day.  3. Vitamin E and multivitamin once a day.  4. Aspirin 325 mg a day.  5. IMDUR 60 mg a day.  6. Altace 2.5 mg a day.  7. Ultram 50 mg three times a day.  8. Lantus Insulin 35 units q.h.s.  9. Neurontin 300 mg four times a day.  10.      Lipitor 20 mg a day.   ALLERGIES:  TETRACYCLINE.   PAST MEDICAL HISTORY:  1. Diabetes mellitus.  2. Hyperlipidemia.  3. Hypertension.   SOCIAL HISTORY:  The patient does not smoke and does not drink alcohol.   FAMILY HISTORY:  Noncontributory.   REVIEW OF SYSTEMS:  Reviewed and is otherwise negative except as noted in  the HPI.   PHYSICAL EXAMINATION:  GENERAL:  He is an elderly gentleman in no acute  distress.  He is alert and oriented x3.  His mood and affect are normal.  VITAL SIGNS:  Weight is 211, blood pressure 110/60 with  a heart rate of 60.  HEENT:  2+ carotids. He has no bruits.  There is no JVD and no thyromegaly.  LUNGS:  Clear to auscultation.  HEART:  Regular rate, S1 and S2.  He has a 1-2/6 systolic ejection murmur at  the left sternal border.  ABDOMEN:  Good bowel sounds and is nontender.  EXTREMITIES:  No cyanosis, clubbing, or edema.  NEUROLOGY:  Nonfocal.   PLAN:  The patient presents with some episodes of some chest pain.  A recent  stress Cardiolite study revealed inferolateral ischemia.  I suspect that he  has worsening of his left main stenosis leading to his  left circumflex  artery.  He does have a protected LAD and so I think that we could certainly  attempt PTCA and stenting of his left main/circumflex artery.  We will  proceed with a diagnostic heart catheterization followed by potential  angioplasty and stenting.  WE have discussed the risks, benefits, and  options of the procedure.  He understands and agrees to proceed.                                                Vesta Mixer, M.D.    PJN/MEDQ  D:  09/30/2003  T:  09/30/2003  Job:  [id number]   cc:   Larina Earthly, M.D.  [address]  [place]  [place] [address]  Fax: [phone]

## 2011-04-09 NOTE — Cardiovascular Report (Signed)
NAME:  [name], [name] NO.:  [id number]   MEDICAL RECORD NO.:  [id number]                   PATIENT TYPE:  OIB   LOCATION:  2899                                 FACILITY:  [facility]   PHYSICIAN:  Vesta Mixer, M.D.              DATE OF BIRTH:  06/18/1928   DATE OF PROCEDURE:  10/01/2003  DATE OF DISCHARGE:                              CARDIAC CATHETERIZATION   INDICATION:  [name] is a 77 year old gentleman with a history of  coronary artery disease.  He is status post coronary artery bypass grafting  and is also status post percutaneous transluminal coronary angioplasty and  stenting of tight left circumflex stenosis.  He recently presented with some  mild episodes of chest pain.  A stress Cardiolite study revealed inferior  lateral ischemia.  He is referred for heart catheterization for further  evaluation.   PROCEDURE:  Left heart catheterization with coronary angiography with  angiography of the saphenous vein grafts and the left internal mammary  artery graft.   The right femoral artery was easily cannulated using the modified Seldinger  technique.   HEMODYNAMICS:  LV pressure was 137/8 with an aortic pressure of 136/54.   ANGIOGRAPHY:  The left main coronary artery has minor luminal irregularities in the  proximal segment followed by a 60% stenosis in the distal segment.  This  lesion extends into the ramus intermediate branch and into the left  circumflex branch.   The left anterior descending artery is occluded proximally.   The ramus intermediate branch is a moderate size branch.  There is a 50%  stenosis at the ostium of this branch.   The left circumflex artery is large and dominant.  There is a 60-70%  stenosis in the circumflex vessel.   There is a mid 30% stenosis followed by a widely patent distal circumflex  stent.  The circumflex artery gives off multiple obtuse marginal arteries  and several small posterior lateral  branches.  The first and second obtuse  marginal arteries are occluded at their mid points with very sluggish  antegrade flow.  The posterior lateral branch and posterior descending  artery are occluded at their mid points with very sluggish antegrade flow.  There is no evidence of competitive flow from the grafts (which are actually  known to be occluded).   The right coronary artery is small and is nondominant.  There is a 50%  stenosis in the mid segment.   The saphenous vein graft to the diagonal branch is widely patent and the  anastomosis is normal.   The saphenous vein graft to the first obtuse marginal artery is known to be  occluded.  The saphenous vein graft to the posterior lateral branches and  the third obtuse marginal branch is occluded.   The left internal mammary artery to LAD graft is widely patent.  There is no  obstruction in blood flow.  The  anastomosis is normal.   LEFT VENTRICULOGRAPHY:  The left ventriculogram was performed in a 30 RAO  position.  It reveals overall normal left ventricular systolic function.  The ejection fraction is approximately 60-65%.   COMPLICATIONS:  None.   CONCLUSION:  1. Severe native coronary artery disease.  2. Patent left internal mammary artery to left anterior descending graft and     a patent saphenous vein graft to the first diagonal vessel.   The grafts to the entire left circumflex system are occluded.  The patient  does have a left main stenosis, but it does not appear that angioplasty or  stenting of this stenosis at this point would be of any benefit.  The distal  branches from the left circumflex artery are occluded/subtotally occluded  and I do not think that angioplasty and stenting of the left main would  increase the blood flow down into the distal vessels.  We will continue with  medical therapy.  It is another risk is that we could risk jailing and  compromising the ostium of the ramus intermediate branch by  trying to  perform an angioplasty and stent on the left circumflex vessel.  I think  that it is best treated medically.  We will continue to watch him and will  intervene only if necessary.                                               Vesta Mixer, M.D.    PJN/MEDQ  D:  10/01/2003  T:  10/01/2003  Job:  [id number]   cc:   [name], M.D.  [address]  [place]  [place] [address]  Fax: [phone]

## 2011-04-09 NOTE — H&P (Signed)
NAME:  [name], [name]                     ACCOUNT NO.:  [id number]   MEDICAL RECORD NO.:  [id number]                   PATIENT TYPE:  INP   LOCATION:  NA                                   FACILITY:  [facility]   PHYSICIAN:  James P. Aplington, M.D.            DATE OF BIRTH:  January 23, 1928   DATE OF ADMISSION:  08/10/2002  DATE OF DISCHARGE:                                HISTORY & PHYSICAL   CHIEF COMPLAINT:  Pain in my back with some radiation in my legs.   HISTORY OF PRESENT ILLNESS:  This 77 year old white male who has been seen  by Dr. Simonne Come for continuing progressive problems concerning pain in his  back and lower extremities.  He has previously undergone lumbar  laminectomies.  He has done well over the last couple of years; however,  when seen in August 2003 he had increasing pain into his back and right leg.  Dr. Timothy Lasso began preliminary treatment with Ultram for his discomfort but  unfortunately increasing in the exercise or walking causes increased pain.  We discussed whether to treat this nonsurgically or surgically and as this  is a very active gentleman, it was felt that he would benefit from surgical  intervention.  He was sent for CT and myelogram which showed marked stenosis  at L3-4 with more involvement on the right than the left.  This correlates  with his physical findings.  He has a negative straight leg raise and motor  is grossly intact.  He has some sensory changes in his lower extremities but  this is more of a neuropathy versus radiculopathy.  The patient is scheduled  for central decompressive lumbar laminectomy at L3-4.  He has been cleared  preoperatively by Dr. Elease Hashimoto for his cardiology and Dr. Timothy Lasso medically.   PAST SURGICAL HISTORY:  This gentleman has had tonsillectomy in 1941, hand  surgery in the distant past, lumbar laminectomies in 1996, 1997, 1998,  coronary bypass surgery in 1998 with stent in 2001.  He has had two renal  calculi in the past.   He has type 1 diabetes.   CURRENT MEDICATIONS:  1. Voltaren 75 mg one b.i.d.  2. Lantus 40 units q.d.  3. Humalog 10/25 units q.d.  4. Capitrol and Cordran p.r.n.  5. Ultram for pain.  6. Neurontin 300 mg one q.d.  7. Altace 2.5 mg one q.d.  8. Atenolol 25 mg one-half tablet q.d.  9. Tricor 160 mg one q.d.  10.      Imdur 60 mg one q.d.  11.      Multivitamins and antioxidants as well.  12.      One enteric-coated aspirin 325 mg one q.d.   Dr. Timothy Lasso is his internal medicine and endocrinologist at [phone].  Dr.  Elease Hashimoto is his cardiologist at [phone].   ALLERGIES:  TETRACYCLINE and QUINIDINE.   FAMILY HISTORY:  The patient has two aunts with diabetes and an  aunt on the  father's side with a stroke.   SOCIAL HISTORY:  The patient is married.  He is a retired Doctor, general practice.  Neither smokes nor drinks.  We plan to have outpatient physical  therapy after the surgery as he has had before due to the fact that his wife  really cannot offer much help at home.   REVIEW OF SYMPTOMS:  CNS:  No seizures, shoulder paralysis, or double  vision.  He does have sensory changes in the lower extremities.  CARDIOVASCULAR:  No chest pain.  No angina.  No orthopnea.  RESPIRATORY:  No  productive cough.  No hemoptysis.  No shortness of breath.  GASTROINTESTINAL:  No nausea, vomiting, melena, bloody stool.  GENITOURINARY:  No discharge, dysuria, hematuria.  MUSCULOSKELETAL:  In  present illness.   PHYSICAL EXAMINATION:  GENERAL:  Alert, cooperative, friendly 77 year old  white male.  He is comfortable and well-nourished.  VITAL SIGNS:  Blood pressure 120/56, pulse 80, respirations 12.  HEENT:  Normocephalic.  PERRLA.  EOM intact.  Oropharynx is clear.  CHEST:  Clear to auscultation.  No rhonchi.  No rales.  HEART:  Regular rate and rhythm.  No murmurs are heard.  ABDOMEN:  Obese, soft.  Liver, spleen not felt.  GENITALIA:  Not done.  Not pertinent to present illness.  RECTAL:  Not done.   Not pertinent to present illness.  EXTREMITIES:  As in present illness above.   ADMITTING DIAGNOSES:  Spinal stenosis L3-4, type 1 diabetes, coronary artery  disease status post stent, coronary artery bypass graft.   PLAN:  The patient will undergo central decompressive lumbar laminectomy L3-  4.  Should he have any cardiac problems we will certainly ask Dr. Elease Hashimoto to  see the patient in the hospital as well as any medical problems will ask Dr.  Creola Corn to follow along.     Dooley L. Shela Nevin, P.A.             James P. Aplington, M.D.    DLU/MEDQ  D:  08/07/2002  T:  08/07/2002  Job:  [id number]   cc:   Vesta Mixer, M.D.   Gwen Pounds, M.D.

## 2011-04-09 NOTE — Op Note (Signed)
NAME:  GAITHER, BIEHN                        ACCOUNT NO.:  [id number]   MEDICAL RECORD NO.:  [id number]                   PATIENT TYPE:  INP   LOCATION:  X002                                 FACILITY:  [facility]   PHYSICIAN:  Illene Labrador. Aplington, M.D.            DATE OF BIRTH:  Dec 25, 1927   DATE OF PROCEDURE:  08/10/2002  DATE OF DISCHARGE:                                 OPERATIVE REPORT   PREOPERATIVE DIAGNOSIS:  Severe multifactorial spinal stenosis L3-4.   POSTOPERATIVE DIAGNOSIS:  Severe multifactorial spinal stenosis L3-4.   OPERATION:  Central decompressive laminectomy L3-4 with decompression of L4  nerve roots bilaterally.   SURGEON:  Illene Labrador. Aplington, M.D.   ASSISTANT:  Philips J. Montez Morita, M.D.   ANESTHESIA:  General.   PATHOLOGY AND JUSTIFICATION FOR PROCEDURE:  He was having only right leg  pain. He has had previous extensive back surgery with decompression of L4-5.  A myelogram has demonstrated severe multifactorial spinal stenosis at L3-4  which seemed to be the culprit in causing his pain. There was no disk  herniation.   DESCRIPTION OF PROCEDURE:  Prophylactic antibiotics, satisfactory general  anesthesia, knee chest position on the  Andrews frame, back was prepped with  Duraprep. With three spinal needles and a lateral x-ray, we tentatively  localized the L3-4 interspace. I went through the old surgical incision at  this level and isolated the L3 spinous process which we felt was at the  focal point of the spinal stenosis and took a confirmatory lateral x-ray and  we were at the correct level. I then dissected soft tissue off the lamina of  L3 and small remnants of L4. Self retaining retractors were inserted. I  began my decompression working on top of the superior portion of the  previous decompression and then moving cephalad centrally and laterally. I  went up and removed the entire neural arch of L3 and kept going up to L2  until there was no longer any  spinal stenosis present. I then brought in the  microscope. My primary attention was on the right where all of his pain was  and made sure that his lateral recess was widely decompressed and that the  L4 nerve root foramen was completely open and free of bone and ligament  material. This was also decompressed similarly on the left side. There was  no unusual bleeding, no dural tears. The wound was irrigated with sterile  saline and Gelfoam was placed over the dura. Self retaining retractors were  removed. Some minimal muscle bleeding was coagulated. The wound was  basically dry on closure. The muscle and fascia were reapproximated with  interrupted #1 Vicryl, the subcutaneous tissue was then infiltrated with  0.5% plain Marcaine. He was given 30 mg of Toradol IV. The subcutaneous  tissue was closed with 2-0 Vicryl, skin with staples. Betadine Adaptic dry  sterile dressing were applied. He was gently rolled to  his bed and at the  time of this dictation was on his way to the recovery room in satisfactory  condition with no known complications.                                                James P. Aplington, M.D.    JPA/MEDQ  D:  08/10/2002  T:  08/10/2002  Job:  [id number]

## 2011-04-09 NOTE — Consult Note (Signed)
NAME:  [name], [name]              ACCOUNT NO.:  [id number]   MEDICAL RECORD NO.:  [id number]          PATIENT TYPE:  OBV   LOCATION:  1406                         FACILITY:  [facility]   PHYSICIAN:  Colleen Can. Deborah Chalk, M.D.DATE OF BIRTH:  July 30, 1928   DATE OF CONSULTATION:  DATE OF DISCHARGE:                                   CONSULTATION   [name] is seen for evaluation of neurological event and a history of  PVCs. He has had some bigeminy and some multifocal PVCs but apparently had a  long history of benign PVCs. He has had previous coronary artery bypass  grafting. I was asked to see him regarding the possibility of this being  related to his neurological episode.   His symptoms began approximately 24 hours and are associated with confusion,  difficulty with speech and some subjective weakness on his right side and  some unsteadiness. He is admitted and neurology has seen him and admitted  him for possible evaluation.   He relates a history of a fall several weeks ago. He is somewhat confused in  his discussion of issues. He does note that he was able to play golf and he  has been free of cardiac symptoms and really any symptoms until the last 24-  48 hours.   PAST MEDICAL HISTORY:  History of diabetes, hypertension, coronary disease  and previous coronary artery bypass grafting, hyperlipidemia, questionable  history of heart failure in the past.   ALLERGIES:  TETRACYCLINE.   CURRENT MEDICATIONS:  Atenolol, voltaren, Tricor, Lasix, insulin.   SOCIAL HISTORY:  He is widowed from October of last year. He has 2 children  including 1 daughter who lives here in [place]. He exercises with his  daughter on a regular basis. He is not really vigorous with his exercise but  he does tend to do that. 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 He has not used cigarettes or alcohol.   REVIEW OF SYSTEMS:  Reasonably unremarkable but is only  borderline reliable.   PHYSICAL EXAMINATION:  VITAL SIGNS:  His blood pressure is 157/56, heart  rate is 95, respiratory rate 16, temperature is 98.  HEENT:  Negative.  LUNGS:  Clear.  HEART:  Regular rate and rhythm without murmur.  ABDOMEN:  Soft, nontender.  EXTREMITIES:  Without edema.  NEUROLOGIC:  He has slow speech. His gait was not checked. To my exam, he  may have some very mild right lower extremity weakness. He is somewhat slow  of thought process and confused but his speech is not garbled.   PERTINENT LABORATORY DATA:  His white count is 17,000, hematocrit is 38.7.  Sodium is 139, potassium 4.1, CO2 was 28, chloride 109, BUN was 28,  creatinine 1.0, glucose was 363. Myoglobin was greater than 500, troponin  was 0.05. CK-MB was negative.   IMPRESSION:  1. History of coronary artery bypass grafting with remote history      apparently of congestive heart failure.  2. History of benign PVCs, currently in sinus rhythm.  3. Undefined CNS event with  confusion and speech difficulty.  4. Diabetes.  5. Hypertension.  6. Increased cholesterol.   PLAN:  I doubt that these PVCs are related to the current event. Will check  a 2-D echocardiogram, monitor for rule out myocardial infarction. Will have  Dr. Elease Barron see on Monday.      Colleen Can. Deborah Chalk, M.D.  Electronically Signed     SNT/MEDQ  D:  08/13/2006  T:  08/16/2006  Job:  [id number]   cc:   [name], M.D.  Fax: [phone]   [name], M.D.  Fax: [phone]

## 2011-04-09 NOTE — Discharge Summary (Signed)
NAME:  [name], [name]                        ACCOUNT NO.:  [id number]   MEDICAL RECORD NO.:  [id number]                   PATIENT TYPE:  INP   LOCATION:  0484                                 FACILITY:  [facility]   PHYSICIAN:  Marlowe Kays, MD                 DATE OF BIRTH:  1928-10-23   DATE OF ADMISSION:  08/10/2002  DATE OF DISCHARGE:  08/13/2002                                 DISCHARGE SUMMARY   ADMISSION DIAGNOSES:  1. Lumbar spinal stenosis.  2. Coronary atherosclerosis.  3. Aortic coronary bypass.  4. Old myocardial infarction.  5. Hypertension.  6. Diabetes mellitus type 2.   DISCHARGE DIAGNOSES:  1. Lumbar spinal stenosis status post central decompressive laminectomy at     L3-4 with decompression at L4 nerve roots bilaterally.  2. Coronary atherosclerosis.  3. Aortic coronary bypass.  4. Old myocardial infarction.  5. Hypertension.  6. Diabetes mellitus type 2.   PROCEDURE:  The patient was taken to the operating room on August 10, 2002 and underwent central decompression laminectomy at L3-4 with  decompression at L4 nerve roots.   SURGEON:  Marlowe Kays, MD.   ASSISTANT:  Ronnell Guadalajara, MD.   CONSULTATIONS:  None.   BRIEF HISTORY:  The patient is a 77 year old white male who has been seen by  Dr. Simonne Come for continuing progressive problems concerning his back pain  and lower extremities.  He had previously undergone lumbar laminectomy and  had done well in the past several years.  However, was seen in August of  2003, had increasing pain into his back and his right leg.  He was sent for  a CT myelogram which showed marked stenosis of L3-4 with more involvement on  the right than the left that correlates with physical findings.  He had a  negative straight leg and motor was grossly intact.   LABORATORY AND ACCESSORY DATA:  CBC on admission showed a hemoglobin of  12.0, hematocrit 34.1, white blood cell count 4.4, red blood cell count  3.54.   Differential on admission was all within normal limits.  Coagulation  studies on admission were all within normal limits.  Routine chemistry on  admission showed a glucose high at 149 and a BUN high at 35.  Urinalysis on  admission was all within normal limits.   There is no EKG on this chart.  There is one-spot radiograph on August 10, 2002 of the lumbar spine which showed film #1 of instruments overlying  spinous process at L2 and L3 and film #2 with the instruments posteriorly  positioned at L3.   HOSPITAL COURSE:  The patient was admitted to [facility] and taken  to the operating room.  He underwent the above-stated procedure without  complications.  The patient tolerated the procedure well and allowed to  return to the recovery room and then to the orthopedic floor to continue  postoperative care.  On postoperative day #1, the patient was complaining of  lower back pain and pain down the posterior thighs.  The patient was  participating in physical therapy and occupational therapy on August 11, 2002.  On postoperative day #2 September 21. 2003, the patient was still  complaining of pain in bilateral hips and legs.  The pain was increased with  walking.  DC was held until the patient was more comfortable.  On August 13, 2002, postop day #3, the patient seen still complaining of pain in the  posterior thigh and the knee.  Pain different prior to surgery but the  patient was ready for discharge.   DISPOSITION:  The patient was discharged home on August 13, 2002.   DISCHARGE MEDICATIONS:  1. Sterapred Dosepak 5 mg, dispense #1 pack.  The patient to take as     directed.  2. Percocet 5/325 mg, dispense #50, one to two p.o. q.4-6 h. p.r.n.  3. Robaxin 500 mg, dispense #50, one p.o. q.6-8 h. p.r.n.   DIET:  As tolerated.   ACTIVITY:  The patient is to be up as tolerated with a walker.  No prolonged  sitting or standing.  No lifting.   FOLLOW UP:  The  patient is to follow up with Dr. Simonne Come two weeks from  surgery.  He is to call for an appointment.   CONDITION ON DISCHARGE:  Stable, improved.     Clarene Reamer, P.A.-C.                   Marlowe Kays, MD    SW/MEDQ  D:  09/03/2002  T:  09/03/2002  Job:  [id number]

## 2011-04-09 NOTE — H&P (Signed)
NAME:  [name], [name]                        ACCOUNT NO.:  [id number]   MEDICAL RECORD NO.:  [id number]                   PATIENT TYPE:  OUT   LOCATION:  CATS                                 FACILITY:  [facility]   PHYSICIAN:  Toniqua Melamed DICTATOR                    DATE OF BIRTH:  [birth year]   DATE OF ADMISSION:  07/13/2002  DATE OF DISCHARGE:                                HISTORY & PHYSICAL   HISTORY OF PRESENT ILLNESS:  The patient is a middle-aged gentleman with a  history of coronary artery disease status post coronary artery bypass  grafting.  He is also status post PTCA and stenting of the proximal left  circumflex artery.  The patient has had some intermittent episodes of  chest  pain and shortness of breath for the past six months or so.  His wife has  been in aggressive chemotherapy, and he has not wanted to have a heart  catheterization while she has been sick.  His wife has recently improved,  and he now presents willing to proceed with heart catheterization.  He has  not had any significant episodes of chest discomfort recently but has  continued to have significant shortness of breath, particularly with  exercise.  He also complains that his back is hurting him.   CURRENT MEDICATIONS:  1. Voltaren 75 mg twice a day.  2. Humulin Lente 40 units in the evening.  3. Humulin Ultralente 25 units in the morning.  4. Glucose 6 three times a day.  5. Neurontin 300 mg 4 times a day.  6. Atenolol 12.5 mg a day.  7. Tri-Chlor 160 mg a day.  8. Vitamin E once a day.  9. Multivitamins once a day.  10.      Viagra as needed.  11.      Enteric-coated aspirin 325 mg a day.  12.      Imdur 60 mg a day.  (He holds Imdur if he takes a Viagra that     week.)  13.      Altace 2.5 mg a day.  14.      Ultram 50 mg twice a day.   ALLERGIES:  No known drug allergies   PAST MEDICAL HISTORY:  1. History of coronary artery disease, status post coronary artery bypass     grafting.  He has  left internal mammary artery to the LAD, saphenous vein     graft to the diagonal branch with a sequential saphenous vein graft to     the OM-2 and OM-3 and a saphenous vein graft to the posterior branch of     the circumflex artery.  During his last heart catheterization, the     saphenous vein graft to the posterolateral branch of the circumflex     artery was occluded proximally.  The saphenous vein graft to the first     and second  obtuse marginal arteries was also occluded.  2. Diabetes mellitus.  3. Congestive heart failure.   SOCIAL HISTORY:  The patient is a nonsmoker.  He does not drink alcohol.   FAMILY HISTORY:  Noncontributory.   REVIEW OF SYSTEMS:  Was reviewed and is essentially negative.   PHYSICAL EXAMINATION:  GENERAL:  He is an elderly gentleman in no acute  distress.  VITAL SIGNS:  Weight 212 pounds, blood pressure 120/68 with heart rate of  66.  NECK:  Reveals 2+ carotids.  There are no bruits.  There is no JVD, no  thyromegaly.  LUNGS:  Clear to auscultation.  HEART:  Regular rate, S1, S2, no murmurs, gallops, or rubs.  ABDOMEN:  Good bowel sounds and nontender.  EXTREMITIES:  No clubbing, cyanosis, or edema.  NEUROLOGIC:  Nonfocal.   LABORATORY DATA:  EKG reveals normal sinus rhythm/sinus bradycardia.  He has  lateral T wave inversions.  This is somewhat new from his previous EKG in  May 2001.   ASSESSMENT/PLAN:  The patient presents with episodes of chest pain.  He has  known occlusions of saphenous vein graft to circumflex and posterolateral  branch as well as first and second obtuse marginal arteries.  He is status  post successful percutaneous transluminal coronary angioplasty and stenting  of his left circumflex artery in the past.  It is possible that this stent  has restenosed.  I have recommended that we proceed with heart  catheterization. We have discussed the risks, benefits and options of heart  catheterization.  He understands and agrees to  proceed.  All other medical  problems are stable.                                               Hernan Turnage DICTATOR    DD/MEDQ  D:  07/11/2002  T:  07/13/2002  Job:  [id number]

## 2011-05-31 ENCOUNTER — Telehealth: Payer: Self-pay | Admitting: Cardiovascular Disease

## 2011-05-31 MED ORDER — ATENOLOL 50 MG PO TABS: 50.0000 mg | ORAL_TABLET | Freq: Every day | ORAL | Status: DC

## 2011-05-31 NOTE — Telephone Encounter (Signed)
Explained it will be escribed and he can go to  Any [facility] and they will be able to reill, Pt verbalized understanding. Alfonso Ramus RN

## 2011-05-31 NOTE — Telephone Encounter (Signed)
Called in because he is leaving to go to [place] today and needs a refill of his Atenolol and wants to come by and pick it up, and expects it to be done within the next 30 min. Please call back. I have pulled the chart.

## 2011-06-13 ENCOUNTER — Emergency Department (HOSPITAL_COMMUNITY)
Admission: EM | Admit: 2011-06-13 | Discharge: 2011-06-13 | Disposition: A | Discharge status: Home / Self Care | Payer: Medicare Other | Attending: Emergency Medicine | Admitting: Emergency Medicine

## 2011-06-13 ENCOUNTER — Emergency Department (HOSPITAL_COMMUNITY): Payer: Medicare Other

## 2011-06-13 DIAGNOSIS — W1809XA Striking against other object with subsequent fall, initial encounter: Secondary | ICD-10-CM | POA: Insufficient documentation

## 2011-06-13 DIAGNOSIS — S20219A Contusion of unspecified front wall of thorax, initial encounter: Secondary | ICD-10-CM | POA: Insufficient documentation

## 2011-06-13 DIAGNOSIS — Z79899 Other long term (current) drug therapy: Secondary | ICD-10-CM | POA: Insufficient documentation

## 2011-06-13 DIAGNOSIS — I10 Essential (primary) hypertension: Secondary | ICD-10-CM | POA: Insufficient documentation

## 2011-06-13 DIAGNOSIS — IMO0002 Reserved for concepts with insufficient information to code with codable children: Secondary | ICD-10-CM | POA: Insufficient documentation

## 2011-06-13 DIAGNOSIS — E119 Type 2 diabetes mellitus without complications: Secondary | ICD-10-CM | POA: Insufficient documentation

## 2011-07-08 ENCOUNTER — Encounter: Payer: Self-pay | Admitting: Cardiovascular Disease

## 2011-07-19 ENCOUNTER — Other Ambulatory Visit: Payer: Self-pay | Admitting: Cardiovascular Disease

## 2011-07-19 DIAGNOSIS — E785 Hyperlipidemia, unspecified: Secondary | ICD-10-CM

## 2011-07-19 DIAGNOSIS — I2510 Atherosclerotic heart disease of native coronary artery without angina pectoris: Secondary | ICD-10-CM

## 2011-07-19 DIAGNOSIS — I10 Essential (primary) hypertension: Secondary | ICD-10-CM

## 2011-07-21 ENCOUNTER — Ambulatory Visit: Payer: Self-pay | Admitting: Cardiovascular Disease

## 2011-07-21 ENCOUNTER — Other Ambulatory Visit: Payer: Self-pay | Admitting: *Deleted

## 2011-08-06 ENCOUNTER — Other Ambulatory Visit (INDEPENDENT_AMBULATORY_CARE_PROVIDER_SITE_OTHER): Payer: Medicare Other | Admitting: *Deleted

## 2011-08-06 ENCOUNTER — Ambulatory Visit (INDEPENDENT_AMBULATORY_CARE_PROVIDER_SITE_OTHER): Payer: Medicare Other | Admitting: Cardiovascular Disease

## 2011-08-06 ENCOUNTER — Encounter: Payer: Self-pay | Admitting: Cardiovascular Disease

## 2011-08-06 VITALS — BP 132/60 | HR 57 | Ht 69.0 in | Wt 199.0 lb

## 2011-08-06 DIAGNOSIS — I10 Essential (primary) hypertension: Secondary | ICD-10-CM

## 2011-08-06 DIAGNOSIS — I2510 Atherosclerotic heart disease of native coronary artery without angina pectoris: Secondary | ICD-10-CM

## 2011-08-06 DIAGNOSIS — E785 Hyperlipidemia, unspecified: Secondary | ICD-10-CM

## 2011-08-06 DIAGNOSIS — E119 Type 2 diabetes mellitus without complications: Secondary | ICD-10-CM

## 2011-08-06 LAB — HEPATIC FUNCTION PANEL
ALT: 17 U/L (ref 0–53)
AST: 20 U/L (ref 0–37)
Albumin: 3.7 g/dL (ref 3.5–5.2)
Alkaline Phosphatase: 50 U/L (ref 39–117)
Bilirubin, Direct: 0.1 mg/dL (ref 0.0–0.3)
Total Bilirubin: 0.8 mg/dL (ref 0.3–1.2)
Total Protein: 6.1 g/dL (ref 6.0–8.3)

## 2011-08-06 LAB — BASIC METABOLIC PANEL
BUN: 35 mg/dL — ABNORMAL HIGH (ref 6–23)
CO2: 30 mEq/L (ref 19–32)
Calcium: 9.2 mg/dL (ref 8.4–10.5)
Chloride: 109 mEq/L (ref 96–112)
Creatinine, Ser: 1.4 mg/dL (ref 0.4–1.5)
GFR: 53.44 mL/min — ABNORMAL LOW (ref >60.00)
Glucose, Bld: 55 mg/dL — ABNORMAL LOW (ref 70–99)
Potassium: 5.1 mEq/L (ref 3.5–5.1)
Sodium: 145 mEq/L (ref 135–145)

## 2011-08-06 LAB — LIPID PANEL
Cholesterol: 119 mg/dL (ref 0–200)
HDL: 34.7 mg/dL — ABNORMAL LOW (ref >39.00)
LDL Cholesterol: 72 mg/dL (ref 0–99)
VLDL: 12.6 mg/dL (ref 0.0–40.0)

## 2011-08-06 NOTE — Progress Notes (Signed)
[name] Date of Birth  May 22, 1928 [facility] / [address] [facility] [address]     [address] [place], [place]  [address] [phone]  Fax  [phone]  History of Present Illness:  77 year old gentleman with a history of coronary artery disease and coronary artery bypass grafting. He has a history of hypertension.  He's not had any significant episodes of chest pain or shortness of breath. He does get short of breath he tries to walk too quickly or walks without his walker.  Ranexa is fairly expensive and he asked if it was absolutely necessary.    He walks with the assistance of a walker because of some low back pain. He's able to get around in his house without a walker fairly well.  Current Outpatient Prescriptions on File Prior to Visit  Medication Sig Dispense Refill  . amLODipine (NORVASC) 5 MG tablet Take 5 mg by mouth daily.        Marland Kitchen atenolol (TENORMIN) 50 MG tablet Take 1 tablet (50 mg total) by mouth daily.  30 tablet  3  . Cholecalciferol (VITAMIN D PO) Take 2,000 mg by mouth as needed.       . fenofibrate 160 MG tablet Take 160 mg by mouth daily.        Marland Kitchen gabapentin (NEURONTIN) 600 MG tablet Take 600 mg by mouth 2 (two) times daily.        . insulin glargine (LANTUS) 100 UNIT/ML injection Inject 40 Units into the skin at bedtime.        . insulin lispro (HUMALOG) 100 UNIT/ML injection Inject 30 Units into the skin. Sliding Scale      . lisinopril (PRINIVIL,ZESTRIL) 40 MG tablet Take 40 mg by mouth daily.        . Multiple Vitamin (MULTIVITAMIN PO) Take by mouth.        Marland Kitchen RANEXA 500 MG 12 hr tablet TAKE 1 TABLET EVERY 12 HOURS.  60 tablet  9  . simvastatin (ZOCOR) 40 MG tablet Take 40 mg by mouth at bedtime.        . TraMADol HCl (ULTRAM PO) Take 50 mg by mouth.         Allergies  Allergen Reactions  . Tetracyclines & Related     Past Medical History  Diagnosis Date  . Hypertension   . Diabetes mellitus   . Dyslipidemia   . Coronary  artery disease     status post CABG. He is also status post PTCA and stenting of the circumflex artery    Past Surgical History  Procedure Date  . Back surgery     x3  . Lithotripsy   . US echocardiography 01-16-09    EF 55-60  . Cardiovascular stress test 09-26-03    EF 43%    History  Smoking status  . Former Smoker  Smokeless tobacco  . Not on file    History  Alcohol Use: Not on file    No family history on file.  Reviw of Systems:  Reviewed in the HPI.  All other systems are negative.  Physical Exam: BP 132/60  Pulse 57  Ht 5\' 9"  (1.753 m)  Wt 199 lb (90.266 kg)  BMI 29.39 kg/m2 The patient is alert and oriented x 3.  The mood and affect are normal.   Skin: warm and dry.  Color is normal.    HEENT:   the sclera are nonicteric.  The mucous membranes are moist.  The carotids  are 2+ without bruits.  There is no thyromegaly.  There is no JVD.    Lungs: clear.  The chest wall is non tender.    Heart: regular rate with a normal S1 and S2.  There are no murmurs, gallops, or rubs. The PMI is not displaced.     Abdomen: good bowel sounds.  There is no guarding or rebound.  There is no hepatosplenomegaly or tenderness.  There are no masses.   Extremities:  no clubbing, cyanosis, or edema.  The legs are without rashes.  The distal pulses are intact.   Neuro:  Cranial nerves II - XII are intact.  Motor and sensory functions are intact.    He walks with the assistance of a walker.  ECG: Sinus bradycardia. He has no ST or T wave changes. Assessment / Plan:

## 2011-08-06 NOTE — Assessment & Plan Note (Signed)
He overall seems to be fairly stable. He would like to stop the Ranexa for a while to see if it's really making any difference. He complains that the cost is fairly high.  I'll see him back in the office in 6 months. I've asked him to call me if he has any worsening of his shortness of breath or chest discomfort.

## 2011-08-10 ENCOUNTER — Encounter: Payer: Self-pay | Admitting: Cardiovascular Disease

## 2011-08-16 LAB — URINALYSIS, ROUTINE W REFLEX MICROSCOPIC
Glucose, UA: >1000 — AB
Leukocytes, UA: NEGATIVE
Nitrite: NEGATIVE
Protein, ur: NEGATIVE

## 2011-08-16 LAB — BASIC METABOLIC PANEL
CO2: 27
GFR calc non Af Amer: >60
Glucose, Bld: 447 — ABNORMAL HIGH
Potassium: 4.2
Sodium: 135

## 2011-08-16 LAB — COMPREHENSIVE METABOLIC PANEL
Albumin: 2.8 — ABNORMAL LOW
Albumin: 3.4 — ABNORMAL LOW
Alkaline Phosphatase: 56
Alkaline Phosphatase: 66
BUN: 24 — ABNORMAL HIGH
BUN: 30 — ABNORMAL HIGH
Calcium: 8.9
Creatinine, Ser: 1.04
Creatinine, Ser: 1.28
Glucose, Bld: 166 — ABNORMAL HIGH
Potassium: 3.5
Potassium: 3.5
Total Protein: 5.4 — ABNORMAL LOW
Total Protein: 6.5

## 2011-08-16 LAB — CBC
HCT: 31.6 — ABNORMAL LOW
Hemoglobin: 11 — ABNORMAL LOW
MCHC: 35
MCV: 96.4
Platelets: 197
RDW: 13

## 2011-08-16 LAB — URINE MICROSCOPIC-ADD ON: Urine-Other: NONE SEEN

## 2011-08-18 LAB — CBC
Hemoglobin: 12.8 — ABNORMAL LOW
MCV: 99
RBC: 3.81 — ABNORMAL LOW
WBC: 7.5

## 2011-08-18 LAB — DIFFERENTIAL
Eosinophils Absolute: 0.1
Lymphs Abs: 0.8
Monocytes Absolute: 0.7
Monocytes Relative: 9
Neutro Abs: 5.9
Neutrophils Relative %: 79 — ABNORMAL HIGH

## 2011-08-18 LAB — URINALYSIS, ROUTINE W REFLEX MICROSCOPIC
Nitrite: NEGATIVE
Specific Gravity, Urine: 1.022
Urobilinogen, UA: 0.2
pH: 5.5

## 2011-08-18 LAB — POCT I-STAT, CHEM 8
Calcium, Ion: 1.29
Chloride: 104
Creatinine, Ser: 1.2
Glucose, Bld: 176 — ABNORMAL HIGH
Potassium: 3.9

## 2011-08-18 LAB — POCT CARDIAC MARKERS
CKMB, poc: 1.8
Troponin i, poc: <0.05

## 2011-08-19 LAB — CBC
HCT: 27.7 — ABNORMAL LOW
MCHC: 34.4
MCV: 98.8
Platelets: 153
RBC: 2.85 — ABNORMAL LOW
RDW: 13.1

## 2011-08-19 LAB — BASIC METABOLIC PANEL
BUN: 30 — ABNORMAL HIGH
CO2: 29
Calcium: 10
Calcium: 8.7
Chloride: 110
Creatinine, Ser: 1.16
GFR calc Af Amer: >60
GFR calc Af Amer: >60
GFR calc non Af Amer: >60
GFR calc non Af Amer: >60
Glucose, Bld: 107 — ABNORMAL HIGH
Glucose, Bld: 118 — ABNORMAL HIGH
Potassium: 4
Sodium: 146 — ABNORMAL HIGH

## 2011-08-19 LAB — HEMOGLOBIN AND HEMATOCRIT, BLOOD
HCT: 34.9 — ABNORMAL LOW
Hemoglobin: 12 — ABNORMAL LOW

## 2011-08-19 LAB — URINE MICROSCOPIC-ADD ON

## 2011-08-19 LAB — URINALYSIS, ROUTINE W REFLEX MICROSCOPIC
Glucose, UA: 100 — AB
Protein, ur: NEGATIVE
Urobilinogen, UA: 0.2

## 2011-08-26 LAB — URINALYSIS, ROUTINE W REFLEX MICROSCOPIC
Ketones, ur: 15 mg/dL — AB
Leukocytes, UA: NEGATIVE
Nitrite: NEGATIVE
Urobilinogen, UA: 0.2 mg/dL (ref 0.0–1.0)
pH: 5.5 (ref 5.0–8.0)

## 2011-08-26 LAB — URINE MICROSCOPIC-ADD ON

## 2011-08-26 LAB — BASIC METABOLIC PANEL
BUN: 18 mg/dL (ref 6–23)
Creatinine, Ser: 0.88 mg/dL (ref 0.4–1.5)
GFR calc non Af Amer: >60 mL/min (ref >60)

## 2011-08-26 LAB — CBC
MCV: 100.9 fL — ABNORMAL HIGH (ref 78.0–100.0)
Platelets: 224 10*3/uL (ref 150–400)
WBC: 7.2 10*3/uL (ref 4.0–10.5)

## 2011-08-26 LAB — DIFFERENTIAL
Basophils Absolute: 0 10*3/uL (ref 0.0–0.1)
Basophils Relative: 0 % (ref 0–1)
Eosinophils Relative: 1 % (ref 0–5)
Lymphocytes Relative: 21 % (ref 12–46)
Neutro Abs: 4.8 10*3/uL (ref 1.7–7.7)

## 2011-08-26 LAB — CK: Total CK: 216 U/L (ref 7–232)

## 2011-10-04 ENCOUNTER — Other Ambulatory Visit: Payer: Self-pay | Admitting: Cardiovascular Disease

## 2011-10-28 ENCOUNTER — Emergency Department (HOSPITAL_COMMUNITY): Payer: Medicare Other

## 2011-10-28 ENCOUNTER — Inpatient Hospital Stay (HOSPITAL_COMMUNITY)
Admission: EM | Admit: 2011-10-28 | Discharge: 2011-11-02 | DRG: 469 | Disposition: A | Discharge status: Skilled Nursing Facility | Payer: Medicare Other | Attending: Orthopedic Surgery | Admitting: Orthopedic Surgery

## 2011-10-28 ENCOUNTER — Other Ambulatory Visit: Payer: Self-pay

## 2011-10-28 ENCOUNTER — Encounter (HOSPITAL_COMMUNITY): Payer: Self-pay | Admitting: Emergency Medicine

## 2011-10-28 DIAGNOSIS — S72009A Fracture of unspecified part of neck of unspecified femur, initial encounter for closed fracture: Secondary | ICD-10-CM | POA: Diagnosis present

## 2011-10-28 DIAGNOSIS — Y998 Other external cause status: Secondary | ICD-10-CM

## 2011-10-28 DIAGNOSIS — F29 Unspecified psychosis not due to a substance or known physiological condition: Secondary | ICD-10-CM | POA: Diagnosis not present

## 2011-10-28 DIAGNOSIS — I2510 Atherosclerotic heart disease of native coronary artery without angina pectoris: Secondary | ICD-10-CM | POA: Diagnosis present

## 2011-10-28 DIAGNOSIS — Z87442 Personal history of urinary calculi: Secondary | ICD-10-CM

## 2011-10-28 DIAGNOSIS — M1610 Unilateral primary osteoarthritis, unspecified hip: Principal | ICD-10-CM | POA: Diagnosis present

## 2011-10-28 DIAGNOSIS — S72001A Fracture of unspecified part of neck of right femur, initial encounter for closed fracture: Secondary | ICD-10-CM | POA: Diagnosis present

## 2011-10-28 DIAGNOSIS — N401 Enlarged prostate with lower urinary tract symptoms: Secondary | ICD-10-CM | POA: Diagnosis present

## 2011-10-28 DIAGNOSIS — I10 Essential (primary) hypertension: Secondary | ICD-10-CM | POA: Diagnosis present

## 2011-10-28 DIAGNOSIS — M169 Osteoarthritis of hip, unspecified: Principal | ICD-10-CM | POA: Diagnosis present

## 2011-10-28 DIAGNOSIS — Z79899 Other long term (current) drug therapy: Secondary | ICD-10-CM

## 2011-10-28 DIAGNOSIS — G8929 Other chronic pain: Secondary | ICD-10-CM | POA: Diagnosis present

## 2011-10-28 DIAGNOSIS — Z951 Presence of aortocoronary bypass graft: Secondary | ICD-10-CM

## 2011-10-28 DIAGNOSIS — M479 Spondylosis, unspecified: Secondary | ICD-10-CM | POA: Diagnosis present

## 2011-10-28 DIAGNOSIS — D62 Acute posthemorrhagic anemia: Secondary | ICD-10-CM | POA: Diagnosis not present

## 2011-10-28 DIAGNOSIS — Z794 Long term (current) use of insulin: Secondary | ICD-10-CM

## 2011-10-28 DIAGNOSIS — IMO0002 Reserved for concepts with insufficient information to code with codable children: Secondary | ICD-10-CM | POA: Diagnosis present

## 2011-10-28 DIAGNOSIS — E785 Hyperlipidemia, unspecified: Secondary | ICD-10-CM | POA: Diagnosis present

## 2011-10-28 DIAGNOSIS — K5730 Diverticulosis of large intestine without perforation or abscess without bleeding: Secondary | ICD-10-CM | POA: Diagnosis present

## 2011-10-28 DIAGNOSIS — Z9861 Coronary angioplasty status: Secondary | ICD-10-CM

## 2011-10-28 DIAGNOSIS — N138 Other obstructive and reflux uropathy: Secondary | ICD-10-CM | POA: Diagnosis present

## 2011-10-28 DIAGNOSIS — I498 Other specified cardiac arrhythmias: Secondary | ICD-10-CM | POA: Diagnosis present

## 2011-10-28 DIAGNOSIS — X58XXXA Exposure to other specified factors, initial encounter: Secondary | ICD-10-CM | POA: Diagnosis present

## 2011-10-28 DIAGNOSIS — J189 Pneumonia, unspecified organism: Secondary | ICD-10-CM | POA: Diagnosis not present

## 2011-10-28 DIAGNOSIS — K219 Gastro-esophageal reflux disease without esophagitis: Secondary | ICD-10-CM | POA: Diagnosis present

## 2011-10-28 DIAGNOSIS — Y929 Unspecified place or not applicable: Secondary | ICD-10-CM

## 2011-10-28 DIAGNOSIS — E1142 Type 2 diabetes mellitus with diabetic polyneuropathy: Secondary | ICD-10-CM | POA: Diagnosis present

## 2011-10-28 DIAGNOSIS — E1149 Type 2 diabetes mellitus with other diabetic neurological complication: Secondary | ICD-10-CM | POA: Diagnosis present

## 2011-10-28 DIAGNOSIS — Z96649 Presence of unspecified artificial hip joint: Secondary | ICD-10-CM

## 2011-10-28 HISTORY — DX: Cardiac murmur, unspecified: R01.1

## 2011-10-28 HISTORY — DX: Type 2 diabetes mellitus with diabetic autonomic (poly)neuropathy: E11.43

## 2011-10-28 HISTORY — DX: Calculus of kidney: N20.0

## 2011-10-28 LAB — URINALYSIS, ROUTINE W REFLEX MICROSCOPIC
Bilirubin Urine: NEGATIVE
Ketones, ur: NEGATIVE mg/dL
Nitrite: NEGATIVE
Specific Gravity, Urine: 1.021 (ref 1.005–1.030)
Urobilinogen, UA: 1 mg/dL (ref 0.0–1.0)

## 2011-10-28 LAB — DIFFERENTIAL
Basophils Absolute: 0.1 10*3/uL (ref 0.0–0.1)
Basophils Relative: 1 % (ref 0–1)
Eosinophils Absolute: 0.2 10*3/uL (ref 0.0–0.7)
Eosinophils Relative: 3 % (ref 0–5)
Lymphocytes Relative: 22 % (ref 12–46)
Monocytes Absolute: 0.9 10*3/uL (ref 0.1–1.0)

## 2011-10-28 LAB — COMPREHENSIVE METABOLIC PANEL
AST: 21 U/L (ref 0–37)
CO2: 28 mEq/L (ref 19–32)
Calcium: 9.6 mg/dL (ref 8.4–10.5)
Creatinine, Ser: 0.88 mg/dL (ref 0.50–1.35)
GFR calc Af Amer: >90 mL/min (ref >90)
GFR calc non Af Amer: 78 mL/min — ABNORMAL LOW (ref >90)
Total Protein: 6.5 g/dL (ref 6.0–8.3)

## 2011-10-28 LAB — CBC
HCT: 33.6 % — ABNORMAL LOW (ref 39.0–52.0)
MCH: 33.2 pg (ref 26.0–34.0)
MCHC: 33.9 g/dL (ref 30.0–36.0)
MCV: 98 fL (ref 78.0–100.0)
RDW: 13.6 % (ref 11.5–15.5)

## 2011-10-28 LAB — GLUCOSE, CAPILLARY

## 2011-10-28 LAB — TYPE AND SCREEN: Antibody Screen: NEGATIVE

## 2011-10-28 MED ORDER — INSULIN ASPART 100 UNIT/ML ~~LOC~~ SOLN
0.0000 [IU] | Freq: Three times a day (TID) | SUBCUTANEOUS | Status: DC
  Administered 2011-10-29: 3 [IU] via SUBCUTANEOUS
  Administered 2011-10-29: 5 [IU] via SUBCUTANEOUS
  Filled 2011-10-28: qty 3

## 2011-10-28 MED ORDER — SODIUM CHLORIDE 0.9 % IV SOLN
999.0000 mL | INTRAVENOUS | Status: DC
  Administered 2011-10-28: 999 mL via INTRAVENOUS

## 2011-10-28 MED ORDER — LIDOCAINE HCL 2 % EX GEL
Freq: Once | CUTANEOUS | Status: AC
  Administered 2011-10-28: 16:00:00 via URETHRAL
  Filled 2011-10-28: qty 20

## 2011-10-28 MED ORDER — AMLODIPINE BESYLATE 2.5 MG PO TABS
2.5000 mg | ORAL_TABLET | Freq: Every day | ORAL | Status: DC
  Administered 2011-10-29 – 2011-11-02 (×5): 2.5 mg via ORAL
  Filled 2011-10-28 (×6): qty 1

## 2011-10-28 MED ORDER — NON FORMULARY: 40.0000 mg | Freq: Every day | Status: DC

## 2011-10-28 MED ORDER — ATENOLOL 50 MG PO TABS
50.0000 mg | ORAL_TABLET | Freq: Every day | ORAL | Status: DC
  Administered 2011-10-29 – 2011-11-01 (×4): 50 mg via ORAL
  Filled 2011-10-28 (×6): qty 1

## 2011-10-28 MED ORDER — ONDANSETRON HCL 4 MG/2ML IJ SOLN: 4.0000 mg | Freq: Four times a day (QID) | INTRAMUSCULAR | Status: DC | PRN

## 2011-10-28 MED ORDER — INSULIN ASPART 100 UNIT/ML ~~LOC~~ SOLN: 0.0000 [IU] | Freq: Every day | SUBCUTANEOUS | Status: DC

## 2011-10-28 MED ORDER — SODIUM CHLORIDE 0.9 % IV SOLN
INTRAVENOUS | Status: DC
  Administered 2011-10-28 – 2011-10-29 (×3): via INTRAVENOUS

## 2011-10-28 MED ORDER — MORPHINE SULFATE 2 MG/ML IJ SOLN
1.0000 mg | INTRAMUSCULAR | Status: DC | PRN
  Administered 2011-10-28 (×2): 2 mg via INTRAVENOUS
  Filled 2011-10-28 (×2): qty 1

## 2011-10-28 MED ORDER — ROSUVASTATIN CALCIUM 10 MG PO TABS
10.0000 mg | ORAL_TABLET | Freq: Every day | ORAL | Status: DC
  Administered 2011-10-28 – 2011-11-01 (×4): 10 mg via ORAL
  Filled 2011-10-28 (×6): qty 1

## 2011-10-28 MED ORDER — INSULIN GLARGINE 100 UNIT/ML ~~LOC~~ SOLN
10.0000 [IU] | Freq: Every day | SUBCUTANEOUS | Status: DC
  Administered 2011-10-28 – 2011-10-29 (×2): 10 [IU] via SUBCUTANEOUS
  Filled 2011-10-28: qty 3

## 2011-10-28 MED ORDER — GABAPENTIN 300 MG PO CAPS
300.0000 mg | ORAL_CAPSULE | Freq: Three times a day (TID) | ORAL | Status: DC
  Administered 2011-10-28 – 2011-11-02 (×13): 300 mg via ORAL
  Filled 2011-10-28 (×17): qty 1

## 2011-10-28 MED ORDER — RANOLAZINE ER 500 MG PO TB12
500.0000 mg | ORAL_TABLET | Freq: Two times a day (BID) | ORAL | Status: DC
  Administered 2011-10-28 – 2011-11-02 (×10): 500 mg via ORAL
  Filled 2011-10-28 (×13): qty 1

## 2011-10-28 MED ORDER — SIMVASTATIN 40 MG PO TABS: 40.0000 mg | ORAL_TABLET | Freq: Every day | ORAL | Status: DC

## 2011-10-28 MED ORDER — LISINOPRIL 20 MG PO TABS
20.0000 mg | ORAL_TABLET | Freq: Every day | ORAL | Status: DC
  Administered 2011-10-29 – 2011-11-02 (×5): 20 mg via ORAL
  Filled 2011-10-28 (×6): qty 1

## 2011-10-28 MED ORDER — FENTANYL CITRATE 0.05 MG/ML IJ SOLN
50.0000 ug | Freq: Once | INTRAMUSCULAR | Status: AC
  Administered 2011-10-28: 50 ug via INTRAVENOUS
  Filled 2011-10-28: qty 2

## 2011-10-28 MED ORDER — ESOMEPRAZOLE MAGNESIUM 40 MG PO CPDR
40.0000 mg | DELAYED_RELEASE_CAPSULE | Freq: Every day | ORAL | Status: DC
  Administered 2011-10-29 – 2011-11-02 (×5): 40 mg via ORAL
  Filled 2011-10-28 (×5): qty 1

## 2011-10-28 MED ORDER — FENOFIBRATE 160 MG PO TABS
160.0000 mg | ORAL_TABLET | Freq: Every day | ORAL | Status: DC
  Administered 2011-10-29 – 2011-11-02 (×5): 160 mg via ORAL
  Filled 2011-10-28 (×6): qty 1

## 2011-10-28 MED ORDER — TRAMADOL HCL 50 MG PO TABS
50.0000 mg | ORAL_TABLET | Freq: Four times a day (QID) | ORAL | Status: DC | PRN
  Administered 2011-10-29: 50 mg via ORAL
  Filled 2011-10-28: qty 1

## 2011-10-28 NOTE — Progress Notes (Signed)
[name] is known to me as I have performed a hip replacement on his wife. I have reviewed the History and Physical from Avel Peace, PA-C and agree  He has a comminuted femoral neck fracture in the setting of a severely arthritic; nearly ankylosed; right hip. He has had pain for several months but it has increased in intensity over the past week.   I have discussed treatment options with the patient and his wife. In the setting of this severe arthritis, I am concerned that with the comminuted fracture we will not be able to get it to heal, and even if it does heal, he will be left with pain from the arthritis. The best solution would be to proceed with a total hip arthroplasty. I have discussed this with them and they agree. I will be out of town over the weekend and will se if my partner Dr. Charlann Boxer can do this tomorrow, otherwise I will do it Monday afternoon at [facility] at the end of my elective surgery schedule.   Dylan Rankin Warden Buffa, MD    10/28/2011, 8:12 PM

## 2011-10-28 NOTE — ED Provider Notes (Signed)
History     CSN: [id number] Arrival date & time: 10/28/2011 11:37 AM   First MD Initiated Contact with Patient 10/28/11 1142      Chief Complaint  Patient presents with  . Hip Pain    (Consider location/radiation/quality/duration/timing/severity/associated sxs/prior treatment) Patient is a 77 y.o. male presenting with hip pain. The history is provided by the patient. The history is limited by the condition of the patient.  Hip Pain The current episode started more than 1 week ago. The problem occurs constantly. Pertinent negatives include no chest pain and no abdominal pain.   patient has had pain in his right hip and back going down to his right lower extremity for about the last week. They've been in [place] was started. No trauma, although he did lift some luggage which he states made her something. No numbness weakness. States she cannot walk to the pain. He has some mild confusion at this point. His wife states it is because he took his tramadol. He's had 6 previous back surgeries. No nausea vomiting diarrhea. No loss of bladder or bowel control. Patient had told his wife that she hurt like when had a pelvis fracture before.  Past Medical History  Diagnosis Date  . Hypertension   . Diabetes mellitus   . Dyslipidemia   . Coronary artery disease     status post CABG. He is also status post PTCA and stenting of the circumflex artery  . Heart murmur   . Renal calculi     Past Surgical History  Procedure Date  . Lithotripsy   . US echocardiography 01-16-09    EF 55-60  . Cardiovascular stress test 09-26-03    EF 43%  . Back surgery     x6  . Coronary artery bypass graft   . Cardiac catheterization     No family history on file.  History  Substance Use Topics  . Smoking status: Former Games developer  . Smokeless tobacco: Not on file  . Alcohol Use: No      Review of Systems  Constitutional: Negative for activity change and appetite change.  HENT: Negative for  neck pain.   Respiratory: Negative for cough.   Cardiovascular: Negative for chest pain.  Gastrointestinal: Negative for abdominal pain.  Genitourinary: Negative for dysuria.  Musculoskeletal: Positive for back pain and gait problem.  Neurological: Negative for speech difficulty and light-headedness.  Psychiatric/Behavioral: Positive for confusion.    Allergies  Tetracyclines & related  Home Medications   Current Outpatient Rx  Name Route Sig Dispense Refill  . AMLODIPINE BESYLATE 2.5 MG PO TABS Oral Take 2.5 mg by mouth daily.      . ATENOLOL 50 MG PO TABS Oral Take 50 mg by mouth daily.      Marland Kitchen VITAMIN D PO Oral Take 2,000 mg by mouth daily.     Marland Kitchen ESOMEPRAZOLE MAGNESIUM 40 MG PO CPDR Oral Take 40 mg by mouth daily before breakfast.      . FENOFIBRATE 160 MG PO TABS Oral Take 160 mg by mouth daily.      Marland Kitchen GABAPENTIN 300 MG PO CAPS Oral Take 300 mg by mouth 3 (three) times daily.      . INSULIN GLARGINE 100 UNIT/ML Jennings SOLN Subcutaneous Inject 40 Units into the skin at bedtime.      . INSULIN LISPRO (HUMAN) 100 UNIT/ML  SOLN Subcutaneous Inject 10 Units into the skin. Sliding Scale    . LISINOPRIL 40 MG PO TABS Oral Take 20  mg by mouth daily.     Marland Kitchen RANOLAZINE 500 MG PO TB12 Oral Take 500 mg by mouth 2 (two) times daily.      Marland Kitchen SIMVASTATIN 40 MG PO TABS Oral Take 40 mg by mouth at bedtime.      Marland Kitchen ULTRAM PO Oral Take 50 mg by mouth 3 (three) times daily.     . MULTIVITAMIN PO Oral Take 1 tablet by mouth daily.       There were no vitals taken for this visit.  Physical Exam  Nursing note and vitals reviewed. Constitutional: He appears well-developed and well-nourished.  HENT:  Head: Normocephalic and atraumatic.  Eyes: EOM are normal. Pupils are equal, round, and reactive to light.  Neck: Normal range of motion. Neck supple.  Cardiovascular: Normal rate, regular rhythm and normal heart sounds.   No murmur heard. Pulmonary/Chest: Effort normal and breath sounds normal.    Abdominal: Soft. Bowel sounds are normal. He exhibits no distension and no mass. There is no tenderness. There is no rebound and no guarding.  Musculoskeletal: He exhibits no edema.       Some pain with movement of the right hip. Patient states it hurts everywhere I palpate on his lower extremities. Neurovascularly intact on the right. Tenderness on right lumbar paraspinal area.  Neurological: He is alert. No cranial nerve deficit.       Mild confusion  Skin: Skin is warm and dry.  Psychiatric: He has a normal mood and affect.    ED Course  Procedures (including critical care time)  Labs Reviewed  CBC - Abnormal; Notable for the following:    RBC 3.43 (*)    Hemoglobin 11.4 (*)    HCT 33.6 (*)    All other components within normal limits  COMPREHENSIVE METABOLIC PANEL - Abnormal; Notable for the following:    Glucose, Bld 219 (*)    BUN 26 (*)    GFR calc non Af Amer 78 (*)    All other components within normal limits  DIFFERENTIAL  TYPE AND SCREEN  URINALYSIS, ROUTINE W REFLEX MICROSCOPIC   Dg Lumbar Spine Complete  10/28/2011  *RADIOLOGY REPORT*  Clinical Data: Chronic low back pain.  LUMBAR SPINE - COMPLETE 4+ VIEW  Comparison: 12/12/2009.  Findings: The patient has undergone prior lumbar fusion.  Hardware is seen at L2-L3.  There is slight loosening of the screws.  There is nonunion across the interspace.  There is a non instrumented fusion at L1-L2.  There is severe degenerative disc disease L3-4, L4-5 as well as L5-S1.  Nonaneurysmal vascular calcification of the aorta is noted. There is a 2 mm degenerative anterolisthesis L3 on L4.  IMPRESSION: Post fusion changes as described.  There is a nonunion at L2-L3. There is no acute compression fracture.  Original Report Authenticated By: Elsie Stain, M.D.   Dg Hip Complete Right  10/28/2011  *RADIOLOGY REPORT*  Clinical Data: Chronic back pain.  Sharp pain right lateral hip.  RIGHT HIP - COMPLETE 2+ VIEW  Comparison: 08/13/2006.   Findings: There is a nondisplaced fracture of the right femoral neck extending obliquely from the lateral aspect of the femoral head towards the lesser trochanter. This appearance was not present in 2007.  Severe degenerative joint disease right hip with bone on bone and sclerotic changes of the acetabulum and femoral head.  Mild degenerative change left hip.  Previous lumbar surgery with degenerative disc disease.  Healed pelvic fractures of the right pubis and ischium.  IMPRESSION:  Nondisplaced fracture of the right femoral neck.  Severe DJD right hip.  Original Report Authenticated By: Elsie Stain, M.D.   Dg Chest Portable 1 View  10/28/2011  *RADIOLOGY REPORT*  Clinical Data: Preop for repair of fractured hip  PORTABLE CHEST - 1 VIEW  Comparison: Chest x-ray of 06/13/2011  Findings: The lungs are not optimally aerated with some elevation of the right hemidiaphragm which appears chronic.  Cardiomegaly is stable.  Median sternotomy sutures are noted from prior CABG.  IMPRESSION: Stable cardiomegaly.  No active lung disease.  Suboptimal aeration.  Original Report Authenticated By: Juline Patch, M.D.     1. Hip fracture     Date: 10/28/2011  Rate: 54  Rhythm: sinus bradycardia  QRS Axis: normal  Intervals: normal  ST/T Wave abnormalities: normal  Conduction Disutrbances:none  Narrative Interpretation: ECG is more bradycardic now.   Old EKG Reviewed: changes noted     MDM  Patient's had pain in his right hip. No clear trauma. He has been unable to ambulate now. X-ray shows a right hip fracture. He will be admitted to orthopedic. Dr. Timothy Lasso his primary care doctor has been consult for medical clearance.        Juliet Rude. Rubin Payor, MD 10/28/11 1544

## 2011-10-28 NOTE — ED Provider Notes (Signed)
Medical screening examination/treatment/procedure(s) were conducted as a shared visit with non-physician practitioner(s) and myself.  I personally evaluated the patient during the encounter  [name]. Rubin Payor, MD 10/28/11 1544

## 2011-10-28 NOTE — H&P (Signed)
Patient ID: [name] MRN: [id number] DOB/AGE: 1928/10/17 77 y.o.  Admit date: 10/28/2011  Chief Complaint:  right hip pain.  Subjective: Patient is admitted for right hip pain.  Patient is a 77 y.o. male who has been seen by Dr. Lequita Halt in ED for ongoing hip pain.  They have been found to have continued progressive pain.  X-rays show that the patient has femoral neck fracture on the right with very slight varus tilt otherwise nondisplaced.  It is felt that they would benefit from undergoing surgical intervention.  Risks and benefits have been discussed with the patient and they elect to proceed with surgery.  The patient has no contraindications to the upcoming procedure such as ongoing infection or progressive neurological disease.  The patient's PCP is Dr. Timothy Lasso.  Their group will be consulter to assist with medical management of the patient.  Patient and wife states that he has had pain for 8 days.  It has gradually increased.  He and his wife had been up to [place] to help move family belongings.  They have made 8 or 9 trips since July.  On the most recent trip about 8 days ago, it was noticed that the patient could not help load the truck.  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  About three days, the pain had essentially put him at bedrest.  He had quite severe pain last night and unable to sleep prompting him to come in and be evaluated.  X-rays were found to show the hip fracture.   Allergies: Allergies  Allergen Reactions  . Tetracyclines & Related      Medications: Nexium 40mg  PO daily Gabapentin 300mg  two tabs three times a day Atneolol 50mg  PO daily Amlodipine 2.5mg  PO daily Lisinopril 20mg  PO daily Tramadol 50mg  PO TID Fenofirbrate 160mg  PO daily Diclofenac 50mg  PO BID Lantus Insulin Humalog Insulin Centrum Silver Mulitvitamin PO daily  Past Medical History: Past Medical History  Diagnosis Date  . Hypertension    . Diabetes mellitus   . Dyslipidemia   . Coronary artery disease     status post CABG. He is also status post PTCA and stenting of the circumflex artery  . Heart murmur   . Renal calculi      Past Surgical History: Past Surgical History  Procedure Date  . Lithotripsy   . US echocardiography 01-16-09    EF 55-60  . Cardiovascular stress test 09-26-03    EF 43%  . Back surgery     x6  . Coronary artery bypass graft   . Cardiac catheterization      Family History: Both parents are deceased.  Social History: History  Substance Use Topics  . Smoking status: Former Games developer  . Smokeless tobacco: Not on file  . Alcohol Use: No     Review of Systems Constitutional: negative Respiratory: positive for dyspnea on exertion Cardiovascular: negative Gastrointestinal: negative Genitourinary:negative Musculoskeletal:positive for arthralgias and left hip and leg pain with weightbearing Neurological: negative  Physical Exam:  Vitals:  Temperature: 98.3 Pulse: 62 Respirations:  17 Blood Pressure: 153/58 O2 Sat%:  96-97%  Patient is an 77 yo male seen in [facility] ED along with his wife for continued right hip pain. Slow with history, most obtained from his wife. General: alert, appears stated age, no distress and slowed mentation HENT:Head: Normal, normocephalic, atraumatic. Eye: negative, conjunctivae/corneas clear. PERRL, EOM's intact. Fundi benign. Neck / Thyroid: supple without significant adenopathy Neck:supple Chest:clear  to auscultation, no wheezes, rales or rhonchi, symmetric air entry Heart:S1 and S2 normal, regular rate and rhythm, 3/6 pansystolic murmur heard at upper left sternal border Abdomen:abdomen soft, non-tender and normal bowel sounds Rectal/Breast/Genitalia: Not done, not pertinent to present illness. Musculoskeletal:  Left leg - neurovascular intact to both lower extremities, pulses intact, right leg is flexed and hip and knee for comfort.  Pain with  palpation in the right lateral hip region.  Motor function intact to right leg  X-rays - pelvis films shows a slightly varus tilted right femoral neck fracture, but otherwise nondisplaced.  Assessment/Plan: Right Femoral Neck Fracture  The patient is being admitted to [facility] to undergo a right hip pinning versus right hip hemiarthroplasty.  Surgery will be performed by Dr. Ollen Gross.  Dr. Ferd Hibbs group to be consulted for medical evaluation and assistance of medical management of this patient during the hospital course.  Avel Peace, PA-C

## 2011-10-28 NOTE — Progress Notes (Signed)
[name] BAIRON KLEMANN is an 77 y.o. male.   PCP:   Gwen Pounds, MD, MD   Chief Complaint:  Right Femoral Neck Fracture      HPI: 77 y.o. male presented to the ED for ongoing hip pain. Pain has been progressive. X-rays show that the patient has femoral neck fracture on the right with very slight varus tilt otherwise nondisplaced.  Patient and wife states that he has had pain for 8 days. It has gradually increased. Up to 8/10 pain.  He walks with a walker and has been using his arms more.  He has been using Tramadol as well.  He and his wife had been up to [place] to help move family belongings. They have made 8 or 9 trips since July. On the most recent trip about 8 days ago, it was noticed that the patient could not help load the truck. 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 About three days, the pain had essentially put him at bedrest. He had quite severe pain last night and unable to sleep prompting him to come in and be evaluated. X-rays were found to show the hip fracture.  The patient is being admitted to [facility] to undergo a right hip pinning versus right hip hemiarthroplasty. Surgery will be performed by Dr. Ollen Gross. I have been consulted for medical evaluation and assistance of medical management of this patient during the hospital course.  He denies CP/Angina or SOB. Some mild DOE - stable for him   Past Medical History:  DM2 with neuropathy,   CHD/CAD/ 5v CABG (97),   Hyperlipidemia,   HTN,  H/O nephroliathiasis,   H/O PVCs,   Anemia,  H/O Fracture pelvis,   Discitis L2-3 1/10,   Multiple Back Issues/DDD/DJD/S/P 6 Lumbar Back Surgeries.   Diverticulosis,  hemorrhoids,  C diff 9/05,  H/O BPH with Urinary Retention,  ED,  L Heel Ulcer-healed,  GERD,  H/O post op Anemia.      Past Medical History  Diagnosis Date  . Hypertension   . Diabetes mellitus   . Dyslipidemia   . Coronary artery disease     status post CABG.  He is also status post PTCA and stenting of the circumflex artery  . Heart murmur   . Renal calculi    Past Surgical History  Procedure Date  . Lithotripsy   . US echocardiography 01-16-09    EF 55-60  . Cardiovascular stress test 09-26-03    EF 43%  . Back surgery     x6  . Coronary artery bypass graft   . Cardiac catheterization    Discectomy & Laminectomy with screw (1/10) T&A Hand Surgery 5 Back surgeries (discectomies) 9/03 L5S surgery (2 in 2009) 5v CABG (97)   Allergies:   Allergies  Allergen Reactions  . Tetracyclines & Related      Medications:  Complete Medication List: 1)  Atenolol 50 Mg Tabs (Atenolol) .Marland Kitchen.. 1 tab daily 2)  Diclofenac Sodium 75 Mg Tbec (Diclofenac sodium) .Marland Kitchen.. 1 po bid 3)  Humalog Kwikpen 100 Unit/ml Soln (Insulin lispro (human)) .... Up to 10 units tid w/ sliding scale 4)  Lantus 100 Unit/ml Soln (Insulin glargine) .... 42u 5)  Lisinopril 20 Mg Tabs (Lisinopril) .... One po daily 6)  Multivitamins Tabs (Multiple vitamin) .... Take one tablet by mouth every day 7)  Neurontin 300 Mg Caps (Gabapentin) .Marland Kitchen.. 1 po tid 8)  Nitrostat 0.4 Mg Subl (Nitroglycerin) .Marland KitchenMarland KitchenMarland Kitchen  As needed 9)  Fenofibrate 160 Mg Tabs (Fenofibrate) .Marland Kitchen.. 1 po qd 10)  Ultram 50 Mg Tabs (Tramadol hcl) .Marland Kitchen.. 1 tab three times daily as needed 12)  Nexium 40 Mg Pack (Esomeprazole magnesium) .Marland Kitchen.. 1 po qd 13)  Truetest Test Strp (Glucose blood) .... Use as directed 3 x per day 14)  Lipitor 20 Mg Tabs (Atorvastatin calcium) .Marland Kitchen.. 1 po qd  or simvastatin? 15)  Norvasc 2.5 Mg Tabs (Amlodipine besylate) .Marland Kitchen.. 1 po qd 16)  Freestyle Lite Test Strp (Glucose blood) .... Use 4 strips per day     Prior to Admission medications   Medication Sig Start Date End Date Taking? Authorizing Provider  amLODipine (NORVASC) 2.5 MG tablet Take 2.5 mg by mouth daily.     Yes Historical Provider, MD  atenolol (TENORMIN) 50 MG tablet Take 50 mg by mouth daily.     Yes Historical Provider, MD    Cholecalciferol (VITAMIN D PO) Take 2,000 mg by mouth daily.    Yes Historical Provider, MD  esomeprazole (NEXIUM) 40 MG capsule Take 40 mg by mouth daily before breakfast.     Yes Historical Provider, MD  fenofibrate 160 MG tablet Take 160 mg by mouth daily.     Yes Historical Provider, MD  gabapentin (NEURONTIN) 300 MG capsule Take 300 mg by mouth 3 (three) times daily.     Yes Historical Provider, MD  insulin glargine (LANTUS) 100 UNIT/ML injection Inject 40 Units into the skin at bedtime.     Yes Historical Provider, MD  insulin lispro (HUMALOG) 100 UNIT/ML injection Inject 10 Units into the skin. Sliding Scale   Yes Historical Provider, MD  lisinopril (PRINIVIL,ZESTRIL) 40 MG tablet Take 20 mg by mouth daily.    Yes Historical Provider, MD  ranolazine (RANEXA) 500 MG 12 hr tablet Take 500 mg by mouth 2 (two) times daily.     Yes Historical Provider, MD  simvastatin (ZOCOR) 40 MG tablet Take 40 mg by mouth at bedtime.     Yes Historical Provider, MD  TraMADol HCl (ULTRAM PO) Take 50 mg by mouth 3 (three) times daily.    Yes Historical Provider, MD  Multiple Vitamin (MULTIVITAMIN PO) Take 1 tablet by mouth daily.     Historical Provider, MD     Medications Prior to Admission  Medication Dose Route Frequency Provider Last Rate Last Dose  . 0.9 %  sodium chloride infusion  999 mL Intravenous Continuous Juliet Rude. Pickering, MD 100 mL/hr at 10/28/11 1332 999 mL at 10/28/11 1332  . 0.9 %  sodium chloride infusion   Intravenous Continuous Alexzandrew Perkins, PA 50 mL/hr at 10/28/11 1735    . fentaNYL (SUBLIMAZE) injection 50 mcg  50 mcg Intravenous Once [employer]. Pickering, MD   50 mcg at 10/28/11 1329  . lidocaine (XYLOCAINE) 2 % jelly   Urethral Once Carolee Rota, PA      . morphine 2 MG/ML injection 1-2 mg  1-2 mg Intravenous Q1H PRN Alexzandrew Perkins, PA   2 mg at 10/28/11 1736  . ondansetron (ZOFRAN) injection 4 mg  4 mg Intravenous Q6H PRN Alexzandrew Perkins, PA       Medications  Prior to Admission  Medication Sig Dispense Refill  . fenofibrate 160 MG tablet Take 160 mg by mouth daily.        Marland Kitchen lisinopril (PRINIVIL,ZESTRIL) 40 MG tablet Take 20 mg by mouth daily.       . simvastatin (ZOCOR) 40 MG tablet Take 40 mg by  mouth at bedtime.        . TraMADol HCl (ULTRAM PO) Take 50 mg by mouth 3 (three) times daily.          Social History:  reports that he has quit smoking. He does not have any smokeless tobacco history on file. He reports that he does not drink alcohol. His drug history not on file. Widowed-then married again. 2nd marriage Dewayne Hatch also a pt) has 2 children and 3 grandchildren occupation: retired part-time at [employer] No Tobacco. No Alcohol.   Family History: No family history on file. -  Father died PNA 24.  Mother died 59 of old age FHx (-) CAD   Review of Systems:  Review of Systems - All Negative. Please see HPI.  CBGs have been fine.  No issues with bowel or Bladder.    Physical Exam:  Blood pressure 148/69, pulse 56, resp. rate 16, SpO2 97.00%. Filed Vitals:   10/28/11 1600 10/28/11 1721  BP: 153/58 148/69  Pulse: 59 56  Resp: 16 16  SpO2: 95% 97%   General appearance: alert, cooperative, appears stated age and no distress Head: Normocephalic, without obvious abnormality, atraumatic Eyes: conjunctivae/corneas clear. PERRL, EOM's intact.  Nose: Nares normal. Septum midline. Mucosa normal. No drainage or sinus tenderness. Throat: lips, mucosa, and tongue normal; teeth and gums normal Neck: no adenopathy, no carotid bruit, no JVD and thyroid not enlarged, symmetric, no tenderness/mass/nodules Resp: clear to auscultation bilaterally Cardio: regular rate and rhythm, S1, S2 normal, no murmur, click, rub or gallop.  Midline sternotomy GI: soft, non-tender; bowel sounds normal; no masses,  no organomegaly Extremities: extremities normal, atraumatic, no cyanosis or edema Pulses: 2+ and symmetric R Hip Tender Lymph nodes: Cervical  adenopathy: no cervical lymphadenopathy Neurologic: Alert and oriented X 3, normal strength and tone. Normal symmetric reflexes.    Labs on Admission:   [facility] 10/28/11 1334  NA 138  K 4.2  CL 102  CO2 28  GLUCOSE 219*  BUN 26*  CREATININE 0.88  CALCIUM 9.6  MG --  PHOS --    Basename 10/28/11 1334  AST 21  ALT 17  ALKPHOS 75  BILITOT 0.4  PROT 6.5  ALBUMIN 3.5   LAB RESULT POCT:  Results for orders placed during the hospital encounter of 10/28/11  CBC      Component Value Range   WBC 7.1  4.0 - 10.5 (K/uL)   RBC 3.43 (*) 4.22 - 5.81 (MIL/uL)   Hemoglobin 11.4 (*) 13.0 - 17.0 (g/dL)   HCT 30.8 (*) 65.7 - 52.0 (%)   MCV 98.0  78.0 - 100.0 (fL)   MCH 33.2  26.0 - 34.0 (pg)   MCHC 33.9  30.0 - 36.0 (g/dL)   RDW 84.6  96.2 - 95.2 (%)   Platelets 239  150 - 400 (K/uL)  DIFFERENTIAL      Component Value Range   Neutrophils Relative 63  43 - 77 (%)   Neutro Abs 4.4  1.7 - 7.7 (K/uL)   Lymphocytes Relative 22  12 - 46 (%)   Lymphs Abs 1.6  0.7 - 4.0 (K/uL)   Monocytes Relative 12  3 - 12 (%)   Monocytes Absolute 0.9  0.1 - 1.0 (K/uL)   Eosinophils Relative 3  0 - 5 (%)   Eosinophils Absolute 0.2  0.0 - 0.7 (K/uL)   Basophils Relative 1  0 - 1 (%)   Basophils Absolute 0.1  0.0 - 0.1 (K/uL)  COMPREHENSIVE METABOLIC PANEL  Component Value Range   Sodium 138  135 - 145 (mEq/L)   Potassium 4.2  3.5 - 5.1 (mEq/L)   Chloride 102  96 - 112 (mEq/L)   CO2 28  19 - 32 (mEq/L)   Glucose, Bld 219 (*) 70 - 99 (mg/dL)   BUN 26 (*) 6 - 23 (mg/dL)   Creatinine, Ser 8.65  0.50 - 1.35 (mg/dL)   Calcium 9.6  8.4 - 78.4 (mg/dL)   Total Protein 6.5  6.0 - 8.3 (g/dL)   Albumin 3.5  3.5 - 5.2 (g/dL)   AST 21  0 - 37 (U/L)   ALT 17  0 - 53 (U/L)   Alkaline Phosphatase 75  39 - 117 (U/L)   Total Bilirubin 0.4  0.3 - 1.2 (mg/dL)   GFR calc non Af Amer 78 (*) >90 (mL/min)   GFR calc Af Amer >90  >90 (mL/min)  TYPE AND SCREEN      Component Value Range   ABO/RH(D) O NEG      Antibody Screen NEG     Sample Expiration 10/31/2011    URINALYSIS, ROUTINE W REFLEX MICROSCOPIC      Component Value Range   Color, Urine YELLOW  YELLOW    APPearance CLEAR  CLEAR    Specific Gravity, Urine 1.021  1.005 - 1.030    pH 7.0  5.0 - 8.0    Glucose, UA >1000 (*) NEGATIVE (mg/dL)   Hgb urine dipstick SMALL (*) NEGATIVE    Bilirubin Urine NEGATIVE  NEGATIVE    Ketones, ur NEGATIVE  NEGATIVE (mg/dL)   Protein, ur NEGATIVE  NEGATIVE (mg/dL)   Urobilinogen, UA 1.0  0.0 - 1.0 (mg/dL)   Nitrite NEGATIVE  NEGATIVE    Leukocytes, UA NEGATIVE  NEGATIVE   PROTIME-INR      Component Value Range   Prothrombin Time 15.3 (*) 11.6 - 15.2 (seconds)   INR 1.18  0.00 - 1.49   URINE MICROSCOPIC-ADD ON      Component Value Range   Squamous Epithelial / LPF RARE  RARE    WBC, UA 0-2  <3 (WBC/hpf)   RBC / HPF 7-10  <3 (RBC/hpf)      Radiological Exams on Admission: Dg Lumbar Spine Complete  10/28/2011  *RADIOLOGY REPORT*  Clinical Data: Chronic low back pain.  LUMBAR SPINE - COMPLETE 4+ VIEW  Comparison: 12/12/2009.  Findings: The patient has undergone prior lumbar fusion.  Hardware is seen at L2-L3.  There is slight loosening of the screws.  There is nonunion across the interspace.  There is a non instrumented fusion at L1-L2.  There is severe degenerative disc disease L3-4, L4-5 as well as L5-S1.  Nonaneurysmal vascular calcification of the aorta is noted. There is a 2 mm degenerative anterolisthesis L3 on L4.  IMPRESSION: Post fusion changes as described.  There is a nonunion at L2-L3. There is no acute compression fracture.  Original Report Authenticated By: Elsie Stain, M.D.   Dg Hip Complete Right  10/28/2011  *RADIOLOGY REPORT*  Clinical Data: Chronic back pain.  Sharp pain right lateral hip.  RIGHT HIP - COMPLETE 2+ VIEW  Comparison: 08/13/2006.  Findings: There is a nondisplaced fracture of the right femoral neck extending obliquely from the lateral aspect of the femoral head  towards the lesser trochanter. This appearance was not present in 2007.  Severe degenerative joint disease right hip with bone on bone and sclerotic changes of the acetabulum and femoral head.  Mild degenerative change left hip.  Previous lumbar surgery with degenerative disc disease.  Healed pelvic fractures of the right pubis and ischium.  IMPRESSION: Nondisplaced fracture of the right femoral neck.  Severe DJD right hip.  Original Report Authenticated By: Elsie Stain, M.D.   Dg Chest Portable 1 View  10/28/2011  *RADIOLOGY REPORT*  Clinical Data: Preop for repair of fractured hip  PORTABLE CHEST - 1 VIEW  Comparison: Chest x-ray of 06/13/2011  Findings: The lungs are not optimally aerated with some elevation of the right hemidiaphragm which appears chronic.  Cardiomegaly is stable.  Median sternotomy sutures are noted from prior CABG.  IMPRESSION: Stable cardiomegaly.  No active lung disease.  Suboptimal aeration.  Original Report Authenticated By: Juline Patch, M.D.      Orders placed during the hospital encounter of 10/28/11  . ED EKG  . ED EKG     Assessment/Plan Principal Problem:  *Fracture of femoral neck, right Active Problems:  HTN (hypertension)  Diabetes mellitus with neurological manifestation  PROBLEM #1: R Fem Neck Fx - For ORIF per Ortho Service.  Agree with them admit and [place] consult. He is clear for surgery. Will need DVT Prophylaxis post op. Pain control per Ortho. I already informed him that he will likely go to [facility] post op.  Problem # 2:  DIABETES MELLITUS, TYPE II (ICD-250.00) DM2 with neuropathy.  A1C and CBGs have been near goal.  I am afraid to overdue treatments and cause lows.  Will order ISS and Lantus Current OutPt meds are:    Humalog Kwikpen 100 Unit/ml Soln (Insulin lispro (human)) ..... Up to 10 units tid w/ sliding scale    Lantus 100 Unit/ml Soln (Insulin glargine) .Marland Kitchen... 42u    Lisinopril 20 Mg Tabs (Lisinopril) ..... One po daily  Last  Hgb A1C: 7.6 Eye Examination:  UTD Foot Exam:  some loss of sensation. Last LDL: 75 Microalbumin/Creatinine: 117.6 MG/DL, 6.8 MG/DL     Problem # 3:  HYPERTENSION (ICD-401.9)  140-150/50-70 on Monitor.   His updated medication list for this problem includes:    Atenolol 25 Mg Tabs (Atenolol) .Marland Kitchen... 1 tab daily    Lisinopril 20 Mg Tabs (Lisinopril) ..... One half po daily    Norvasc 2.5 Mg Tabs (Amlodipine besylate) .Marland Kitchen... 1 po qd   Problem # 4:  BACK PAIN, CHRONIC (ICD-724.5)  H/O Discitis L2-3 1/10,   Multiple Back Issues/DDD/DJD/S/P 6 Lumbar Back Surgeries.  Doing as well as can be expected from Back.  Sees Dr Danielle Dess prn. Markedly less pain. More of a functional issue now. Had been Doing well with walker.  OtPt Pain Meds include:    Diclofenac Sodium 75 Mg Tbec (Diclofenac sodium) .Marland Kitchen... 1 po bid    Ultram 50 Mg Tabs (Tramadol hcl) .Marland Kitchen... 1 tab three times daily as needed   Problem # 5:  HYPERLIPIDEMIA (ICD-272.4)  Last LDL was fine.   Problem # 6:  CAD (ICD-414.00) - CHD/CAD/ 5v CABG (97),   No Angina.  Doing well. No current angina. Tele is HR 60 and NSR. Last OV with Dr Elease Hashimoto 08/11/11 - was stable.  D/Ced Ranexa and f/up 6 months. Ranexa is currently on order and can be d/ced at discharge.  Date: 10/28/2011 EKG Rate: 54  Rhythm: sinus bradycardia  QRS Axis: normal  Intervals: normal  ST/T Wave abnormalities: normal  Conduction Disutrbances:none  Narrative Interpretation: ECG is more bradycardic     His updated medication list for this problem includes:    Atenolol 50 Mg Tabs (  Atenolol) .Marland Kitchen... 1 tab daily    Lisinopril 20 Mg Tabs (Lisinopril) ..... One po daily    Nitrostat 0.4 Mg Subl (Nitroglycerin) .Marland Kitchen... As needed    Norvasc 2.5 Mg Tabs (Amlodipine besylate) .Marland Kitchen... 1 po qd  Problem #7 SCD to LLE and anticoagulation post op.  Problem #8 Anemia - Hbg 11.4 - follow.    Jacyln Carmer M 10/28/2011, 6:28 PM

## 2011-10-28 NOTE — ED Notes (Signed)
Per Ems- pt coming from with right hip pain that radiates to his knee.  Pt recently took a two day road trip from [place], [place].  Pt wife was unable to get pt in car so called ems.

## 2011-10-28 NOTE — ED Provider Notes (Signed)
History     CSN: [id number] Arrival date & time: 10/28/2011 11:37 AM   First MD Initiated Contact with Patient 10/28/11 1142      Chief Complaint  Patient presents with  . Hip Pain    (Consider location/radiation/quality/duration/timing/severity/associated sxs/prior treatment) HPI  Past Medical History  Diagnosis Date  . Hypertension   . Diabetes mellitus   . Dyslipidemia   . Coronary artery disease     status post CABG. He is also status post PTCA and stenting of the circumflex artery    Past Surgical History  Procedure Date  . Back surgery     x3  . Lithotripsy   . US echocardiography 01-16-09    EF 55-60  . Cardiovascular stress test 09-26-03    EF 43%    No family history on file.  History  Substance Use Topics  . Smoking status: Former Games developer  . Smokeless tobacco: Not on file  . Alcohol Use: Not on file      Review of Systems  Allergies  Tetracyclines & related  Home Medications   Current Outpatient Rx  Name Route Sig Dispense Refill  . AMLODIPINE BESYLATE 2.5 MG PO TABS Oral Take 2.5 mg by mouth daily.      . ATENOLOL 50 MG PO TABS Oral Take 50 mg by mouth daily.      Marland Kitchen VITAMIN D PO Oral Take 2,000 mg by mouth daily.     Marland Kitchen ESOMEPRAZOLE MAGNESIUM 40 MG PO CPDR Oral Take 40 mg by mouth daily before breakfast.      . FENOFIBRATE 160 MG PO TABS Oral Take 160 mg by mouth daily.      Marland Kitchen GABAPENTIN 300 MG PO CAPS Oral Take 300 mg by mouth 3 (three) times daily.      . INSULIN GLARGINE 100 UNIT/ML [place] SOLN Subcutaneous Inject 40 Units into the skin at bedtime.      . INSULIN LISPRO (HUMAN) 100 UNIT/ML [place] SOLN Subcutaneous Inject 10 Units into the skin. Sliding Scale    . LISINOPRIL 40 MG PO TABS Oral Take 20 mg by mouth daily.     Marland Kitchen RANOLAZINE 500 MG PO TB12 Oral Take 500 mg by mouth 2 (two) times daily.      Marland Kitchen SIMVASTATIN 40 MG PO TABS Oral Take 40 mg by mouth at bedtime.      Marland Kitchen ULTRAM PO Oral Take 50 mg by mouth 3 (three) times daily.     .  MULTIVITAMIN PO Oral Take 1 tablet by mouth daily.       There were no vitals taken for this visit.  Physical Exam  ED Course  Procedures (including critical care time)  Labs Reviewed  CBC - Abnormal; Notable for the following:    RBC 3.43 (*)    Hemoglobin 11.4 (*)    HCT 33.6 (*)    All other components within normal limits  COMPREHENSIVE METABOLIC PANEL - Abnormal; Notable for the following:    Glucose, Bld 219 (*)    BUN 26 (*)    GFR calc non Af Amer 78 (*)    All other components within normal limits  DIFFERENTIAL  TYPE AND SCREEN  URINALYSIS, ROUTINE W REFLEX MICROSCOPIC   Dg Lumbar Spine Complete  10/28/2011  *RADIOLOGY REPORT*  Clinical Data: Chronic low back pain.  LUMBAR SPINE - COMPLETE 4+ VIEW  Comparison: 12/12/2009.  Findings: The patient has undergone prior lumbar fusion.  Hardware is seen at L2-L3.  There is slight  loosening of the screws.  There is nonunion across the interspace.  There is a non instrumented fusion at L1-L2.  There is severe degenerative disc disease L3-4, L4-5 as well as L5-S1.  Nonaneurysmal vascular calcification of the aorta is noted. There is a 2 mm degenerative anterolisthesis L3 on L4.  IMPRESSION: Post fusion changes as described.  There is a nonunion at L2-L3. There is no acute compression fracture.  Original Report Authenticated By: Elsie Stain, M.D.   Dg Hip Complete Right  10/28/2011  *RADIOLOGY REPORT*  Clinical Data: Chronic back pain.  Sharp pain right lateral hip.  RIGHT HIP - COMPLETE 2+ VIEW  Comparison: 08/13/2006.  Findings: There is a nondisplaced fracture of the right femoral neck extending obliquely from the lateral aspect of the femoral head towards the lesser trochanter. This appearance was not present in 2007.  Severe degenerative joint disease right hip with bone on bone and sclerotic changes of the acetabulum and femoral head.  Mild degenerative change left hip.  Previous lumbar surgery with degenerative disc disease.   Healed pelvic fractures of the right pubis and ischium.  IMPRESSION: Nondisplaced fracture of the right femoral neck.  Severe DJD right hip.  Original Report Authenticated By: Elsie Stain, M.D.     1. Hip fracture     2:16 PM patient discussed with Dr. Rubin Payor, seen and examined. Patient has x-ray showing a right hip fracture. Dr. Rubin Payor has talked with Dr. Tana Felts group who will see patient in emergency department. Will potentially need to call patient's primary care doctor if orthopedics requests.  2:17 PM Exam:  Gen NAD; Heart RRR, nml S1,S2, no m/r/g; Lungs CTAB; Abd soft, NT, no rebound or guarding; Ext 2+ pedal pulses bilaterally, external rotation of right lower extremity. There is significant tenderness to palpation with palpation laterally of right hip.   MDM  Admit for hip fracture.        Eustace Moore [place], [place] 10/28/11 [phone]

## 2011-10-29 ENCOUNTER — Encounter (HOSPITAL_COMMUNITY)
Admission: EM | Disposition: A | Discharge status: Skilled Nursing Facility | Payer: Self-pay | Source: Home / Self Care | Attending: Orthopedic Surgery

## 2011-10-29 ENCOUNTER — Inpatient Hospital Stay (HOSPITAL_COMMUNITY): Payer: Medicare Other

## 2011-10-29 ENCOUNTER — Inpatient Hospital Stay (HOSPITAL_COMMUNITY): Payer: Medicare Other | Admitting: Anesthesiology

## 2011-10-29 ENCOUNTER — Encounter (HOSPITAL_COMMUNITY): Payer: Self-pay | Admitting: Anesthesiology

## 2011-10-29 ENCOUNTER — Other Ambulatory Visit: Payer: Self-pay

## 2011-10-29 HISTORY — PX: TOTAL HIP ARTHROPLASTY: SHX124

## 2011-10-29 LAB — MRSA PCR SCREENING: MRSA by PCR: NEGATIVE

## 2011-10-29 LAB — GLUCOSE, CAPILLARY
Glucose-Capillary: 160 mg/dL — ABNORMAL HIGH (ref 70–99)
Glucose-Capillary: 166 mg/dL — ABNORMAL HIGH (ref 70–99)

## 2011-10-29 SURGERY — ARTHROPLASTY, HIP, TOTAL,POSTERIOR APPROACH
Anesthesia: General | Site: Hip | Laterality: Right | Wound class: Clean

## 2011-10-29 MED ORDER — LIDOCAINE HCL (CARDIAC) 20 MG/ML IV SOLN
INTRAVENOUS | Status: DC | PRN
  Administered 2011-10-29: 50 mg via INTRAVENOUS

## 2011-10-29 MED ORDER — DOCUSATE SODIUM 100 MG PO CAPS
100.0000 mg | ORAL_CAPSULE | Freq: Two times a day (BID) | ORAL | Status: DC
  Administered 2011-10-29 – 2011-11-02 (×8): 100 mg via ORAL
  Filled 2011-10-29 (×9): qty 1

## 2011-10-29 MED ORDER — FLEET ENEMA 7-19 GM/118ML RE ENEM: 1.0000 | ENEMA | Freq: Once | RECTAL | Status: AC | PRN

## 2011-10-29 MED ORDER — TRAMADOL HCL 50 MG PO TABS
50.0000 mg | ORAL_TABLET | Freq: Four times a day (QID) | ORAL | Status: DC
  Administered 2011-10-30: 100 mg via ORAL
  Administered 2011-10-31: 50 mg via ORAL
  Administered 2011-11-01 (×2): 100 mg via ORAL
  Administered 2011-11-01 (×2): 50 mg via ORAL
  Administered 2011-11-02: 100 mg via ORAL
  Administered 2011-11-02 (×2): 50 mg via ORAL
  Filled 2011-10-29 (×19): qty 2

## 2011-10-29 MED ORDER — EPHEDRINE SULFATE 50 MG/ML IJ SOLN
INTRAMUSCULAR | Status: DC | PRN
  Administered 2011-10-29: 10 mg via INTRAVENOUS

## 2011-10-29 MED ORDER — TRAMADOL HCL 50 MG PO TABS
50.0000 mg | ORAL_TABLET | Freq: Once | ORAL | Status: AC
  Administered 2011-10-30: 100 mg via ORAL

## 2011-10-29 MED ORDER — KETOROLAC TROMETHAMINE 15 MG/ML IJ SOLN
7.5000 mg | Freq: Four times a day (QID) | INTRAMUSCULAR | Status: AC
  Administered 2011-10-29 – 2011-10-31 (×6): 7.5 mg via INTRAVENOUS
  Filled 2011-10-29 (×7): qty 1

## 2011-10-29 MED ORDER — NEOSTIGMINE METHYLSULFATE 1 MG/ML IJ SOLN
INTRAMUSCULAR | Status: DC | PRN
  Administered 2011-10-29: 4 mg via INTRAVENOUS

## 2011-10-29 MED ORDER — ATROPINE SULFATE 0.4 MG/ML IJ SOLN
INTRAMUSCULAR | Status: DC | PRN
  Administered 2011-10-29: 0.4 mg via INTRAVENOUS

## 2011-10-29 MED ORDER — CISATRACURIUM BESYLATE 2 MG/ML IV SOLN
INTRAVENOUS | Status: DC | PRN
  Administered 2011-10-29: 6 mg via INTRAVENOUS

## 2011-10-29 MED ORDER — DIPHENHYDRAMINE HCL 25 MG PO CAPS: 25.0000 mg | ORAL_CAPSULE | Freq: Four times a day (QID) | ORAL | Status: DC | PRN

## 2011-10-29 MED ORDER — PROPOFOL 10 MG/ML IV EMUL
INTRAVENOUS | Status: DC | PRN
  Administered 2011-10-29: 120 mg via INTRAVENOUS

## 2011-10-29 MED ORDER — BISACODYL 5 MG PO TBEC
5.0000 mg | DELAYED_RELEASE_TABLET | Freq: Every day | ORAL | Status: DC | PRN
  Administered 2011-11-02: 5 mg via ORAL
  Filled 2011-10-29: qty 1

## 2011-10-29 MED ORDER — ONDANSETRON HCL 4 MG/2ML IJ SOLN
INTRAMUSCULAR | Status: DC | PRN
  Administered 2011-10-29: 4 mg via INTRAVENOUS

## 2011-10-29 MED ORDER — RIVAROXABAN 10 MG PO TABS
10.0000 mg | ORAL_TABLET | ORAL | Status: DC
  Administered 2011-10-30 – 2011-11-01 (×3): 10 mg via ORAL
  Filled 2011-10-29 (×4): qty 1

## 2011-10-29 MED ORDER — SUFENTANIL CITRATE 50 MCG/ML IV SOLN
INTRAVENOUS | Status: DC | PRN
Start: 2011-10-29 — End: 2011-10-29
  Administered 2011-10-29: 5 ug via INTRAVENOUS
  Administered 2011-10-29: 15 ug via INTRAVENOUS

## 2011-10-29 MED ORDER — POLYETHYLENE GLYCOL 3350 17 G PO PACK
17.0000 g | PACK | Freq: Two times a day (BID) | ORAL | Status: DC
  Administered 2011-10-29 – 2011-11-01 (×7): 17 g via ORAL
  Filled 2011-10-29 (×10): qty 1

## 2011-10-29 MED ORDER — INSULIN ASPART 100 UNIT/ML ~~LOC~~ SOLN
0.0000 [IU] | Freq: Three times a day (TID) | SUBCUTANEOUS | Status: DC
  Administered 2011-10-30: 5 [IU] via SUBCUTANEOUS
  Administered 2011-10-30: 11 [IU] via SUBCUTANEOUS
  Filled 2011-10-29: qty 3

## 2011-10-29 MED ORDER — CHLORHEXIDINE GLUCONATE 0.12 % MT SOLN
15.0000 mL | Freq: Two times a day (BID) | OROMUCOSAL | Status: DC
  Administered 2011-10-29: 15 mL via OROMUCOSAL
  Filled 2011-10-29 (×3): qty 15

## 2011-10-29 MED ORDER — TRAMADOL HCL 50 MG PO TABS
50.0000 mg | ORAL_TABLET | Freq: Four times a day (QID) | ORAL | Status: DC
  Administered 2011-10-29: 100 mg via ORAL
  Filled 2011-10-29 (×2): qty 2

## 2011-10-29 MED ORDER — GLYCOPYRROLATE 0.2 MG/ML IJ SOLN
INTRAMUSCULAR | Status: DC | PRN
  Administered 2011-10-29: .6 mg via INTRAVENOUS

## 2011-10-29 MED ORDER — FENTANYL CITRATE 0.05 MG/ML IJ SOLN
25.0000 ug | INTRAMUSCULAR | Status: DC | PRN
  Administered 2011-10-29 (×3): 25 ug via INTRAVENOUS

## 2011-10-29 MED ORDER — FERROUS SULFATE 325 (65 FE) MG PO TABS
325.0000 mg | ORAL_TABLET | Freq: Three times a day (TID) | ORAL | Status: DC
  Administered 2011-10-30 – 2011-11-02 (×10): 325 mg via ORAL
  Filled 2011-10-29 (×12): qty 1

## 2011-10-29 MED ORDER — ALUM & MAG HYDROXIDE-SIMETH 200-200-20 MG/5ML PO SUSP: 30.0000 mL | ORAL | Status: DC | PRN

## 2011-10-29 MED ORDER — METHOCARBAMOL 500 MG PO TABS
500.0000 mg | ORAL_TABLET | Freq: Four times a day (QID) | ORAL | Status: DC | PRN
  Administered 2011-10-30 – 2011-10-31 (×2): 500 mg via ORAL
  Filled 2011-10-29 (×3): qty 1

## 2011-10-29 MED ORDER — METHOCARBAMOL 100 MG/ML IJ SOLN
500.0000 mg | Freq: Four times a day (QID) | INTRAVENOUS | Status: DC | PRN
  Administered 2011-10-29 – 2011-10-30 (×2): 500 mg via INTRAVENOUS
  Filled 2011-10-29 (×2): qty 5

## 2011-10-29 MED ORDER — PHENOL 1.4 % MT LIQD: 1.0000 | OROMUCOSAL | Status: DC | PRN

## 2011-10-29 MED ORDER — METOCLOPRAMIDE HCL 10 MG PO TABS: 5.0000 mg | ORAL_TABLET | Freq: Three times a day (TID) | ORAL | Status: DC | PRN

## 2011-10-29 MED ORDER — ACETAMINOPHEN 325 MG PO TABS
650.0000 mg | ORAL_TABLET | Freq: Four times a day (QID) | ORAL | Status: DC | PRN
  Filled 2011-10-29: qty 2

## 2011-10-29 MED ORDER — SODIUM CHLORIDE 0.9 % IV SOLN
100.0000 mL/h | INTRAVENOUS | Status: DC
  Administered 2011-10-30 – 2011-10-31 (×3): 100 mL/h via INTRAVENOUS
  Filled 2011-10-29 (×8): qty 1000

## 2011-10-29 MED ORDER — CEFAZOLIN SODIUM 1-5 GM-% IV SOLN
INTRAVENOUS | Status: DC | PRN
  Administered 2011-10-29: 2 g via INTRAVENOUS

## 2011-10-29 MED ORDER — ACETAMINOPHEN 650 MG RE SUPP: 650.0000 mg | Freq: Four times a day (QID) | RECTAL | Status: DC | PRN

## 2011-10-29 MED ORDER — BIOTENE DRY MOUTH MT LIQD
15.0000 mL | Freq: Two times a day (BID) | OROMUCOSAL | Status: DC
  Administered 2011-10-30 – 2011-11-01 (×5): 15 mL via OROMUCOSAL

## 2011-10-29 MED ORDER — ZOLPIDEM TARTRATE 5 MG PO TABS: 5.0000 mg | ORAL_TABLET | Freq: Every evening | ORAL | Status: DC | PRN

## 2011-10-29 MED ORDER — PROMETHAZINE HCL 25 MG/ML IJ SOLN: 6.2500 mg | INTRAMUSCULAR | Status: DC | PRN

## 2011-10-29 MED ORDER — CEFAZOLIN SODIUM 1-5 GM-% IV SOLN
1.0000 g | Freq: Four times a day (QID) | INTRAVENOUS | Status: AC
  Administered 2011-10-30 (×3): 1 g via INTRAVENOUS
  Filled 2011-10-29 (×3): qty 50

## 2011-10-29 MED ORDER — SUCCINYLCHOLINE CHLORIDE 20 MG/ML IJ SOLN
INTRAMUSCULAR | Status: DC | PRN
  Administered 2011-10-29: 80 mg via INTRAVENOUS

## 2011-10-29 MED ORDER — LACTATED RINGERS IV SOLN
INTRAVENOUS | Status: DC
  Administered 2011-10-29: 75 mL/h via INTRAVENOUS

## 2011-10-29 MED ORDER — BUPIVACAINE LIPOSOME 1.3 % IJ SUSP
20.0000 mL | INTRAMUSCULAR | Status: AC
  Administered 2011-10-29: 20 mL
  Filled 2011-10-29: qty 20

## 2011-10-29 MED ORDER — ONDANSETRON HCL 4 MG/2ML IJ SOLN: 4.0000 mg | Freq: Four times a day (QID) | INTRAMUSCULAR | Status: DC | PRN

## 2011-10-29 MED ORDER — LACTATED RINGERS IV SOLN
INTRAVENOUS | Status: DC | PRN
  Administered 2011-10-29 (×2): via INTRAVENOUS

## 2011-10-29 MED ORDER — SODIUM CHLORIDE 0.9 % IJ SOLN
INTRAMUSCULAR | Status: DC | PRN
  Administered 2011-10-29: 50 mL

## 2011-10-29 MED ORDER — METOCLOPRAMIDE HCL 5 MG/ML IJ SOLN: 5.0000 mg | Freq: Three times a day (TID) | INTRAMUSCULAR | Status: DC | PRN

## 2011-10-29 MED ORDER — ACETAMINOPHEN 10 MG/ML IV SOLN
INTRAVENOUS | Status: DC | PRN
  Administered 2011-10-29: 1000 mg via INTRAVENOUS

## 2011-10-29 MED ORDER — ONDANSETRON HCL 4 MG PO TABS: 4.0000 mg | ORAL_TABLET | Freq: Four times a day (QID) | ORAL | Status: DC | PRN

## 2011-10-29 MED ORDER — MENTHOL 3 MG MT LOZG: 1.0000 | LOZENGE | OROMUCOSAL | Status: DC | PRN

## 2011-10-29 SURGICAL SUPPLY — 44 items
BAG ZIPLOCK 12X15 (MISCELLANEOUS) ×2 IMPLANT
BLADE SAW SGTL 18X1.27X75 (BLADE) ×2 IMPLANT
CLOTH BEACON ORANGE TIMEOUT ST (SAFETY) ×2 IMPLANT
DERMABOND ADVANCED (GAUZE/BANDAGES/DRESSINGS) ×1
DERMABOND ADVANCED .7 DNX12 (GAUZE/BANDAGES/DRESSINGS) ×1 IMPLANT
DRAPE INCISE IOBAN 85X60 (DRAPES) ×2 IMPLANT
DRAPE ORTHO SPLIT 77X108 STRL (DRAPES) ×2
DRAPE POUCH INSTRU U-SHP 10X18 (DRAPES) ×2 IMPLANT
DRAPE SURG 17X11 SM STRL (DRAPES) ×2 IMPLANT
DRAPE SURG ORHT 6 SPLT 77X108 (DRAPES) ×2 IMPLANT
DRAPE U-SHAPE 47X51 STRL (DRAPES) ×2 IMPLANT
DRSG AQUACEL AG ADV 3.5X10 (GAUZE/BANDAGES/DRESSINGS) ×2 IMPLANT
DRSG TEGADERM 4X4.75 (GAUZE/BANDAGES/DRESSINGS) ×2 IMPLANT
DURAPREP 26ML APPLICATOR (WOUND CARE) ×2 IMPLANT
ELECT BLADE TIP CTD 4 INCH (ELECTRODE) ×2 IMPLANT
ELECT REM PT RETURN 9FT ADLT (ELECTROSURGICAL) ×2
ELECTRODE REM PT RTRN 9FT ADLT (ELECTROSURGICAL) ×1 IMPLANT
EVACUATOR 1/8 PVC DRAIN (DRAIN) ×2 IMPLANT
FACESHIELD LNG OPTICON STERILE (SAFETY) ×8 IMPLANT
GAUZE SPONGE 2X2 8PLY STRL LF (GAUZE/BANDAGES/DRESSINGS) ×1 IMPLANT
GLOVE BIOGEL PI IND STRL 7.5 (GLOVE) ×1 IMPLANT
GLOVE BIOGEL PI IND STRL 8 (GLOVE) ×1 IMPLANT
GLOVE BIOGEL PI INDICATOR 7.5 (GLOVE) ×1
GLOVE BIOGEL PI INDICATOR 8 (GLOVE) ×1
GLOVE ECLIPSE 8.0 STRL XLNG CF (GLOVE) ×2 IMPLANT
GLOVE ORTHO TXT STRL SZ7.5 (GLOVE) ×4 IMPLANT
GOWN STRL NON-REIN LRG LVL3 (GOWN DISPOSABLE) ×2 IMPLANT
KIT BASIN OR (CUSTOM PROCEDURE TRAY) ×2 IMPLANT
MANIFOLD NEPTUNE II (INSTRUMENTS) ×2 IMPLANT
NDL SAFETY ECLIPSE 18X1.5 (NEEDLE) ×1 IMPLANT
NEEDLE HYPO 18GX1.5 SHARP (NEEDLE) ×1
NS IRRIG 1000ML POUR BTL (IV SOLUTION) ×2 IMPLANT
PACK TOTAL JOINT (CUSTOM PROCEDURE TRAY) ×2 IMPLANT
POSITIONER SURGICAL ARM (MISCELLANEOUS) ×2 IMPLANT
SPONGE GAUZE 2X2 STER 10/PKG (GAUZE/BANDAGES/DRESSINGS) ×1
SUCTION FRAZIER TIP 10 FR DISP (SUCTIONS) ×2 IMPLANT
SUT MNCRL AB 4-0 PS2 18 (SUTURE) ×2 IMPLANT
SUT VIC AB 1 CT1 36 (SUTURE) ×6 IMPLANT
SUT VIC AB 2-0 CT1 27 (SUTURE) ×2
SUT VIC AB 2-0 CT1 TAPERPNT 27 (SUTURE) ×2 IMPLANT
SYR 30ML LL (SYRINGE) ×2 IMPLANT
TOWEL OR 17X26 10 PK STRL BLUE (TOWEL DISPOSABLE) ×6 IMPLANT
TRAY FOLEY CATH 14FRSI W/METER (CATHETERS) ×2 IMPLANT
WATER STERILE IRR 1500ML POUR (IV SOLUTION) ×2 IMPLANT

## 2011-10-29 NOTE — Anesthesia Postprocedure Evaluation (Deleted)
  Anesthesia Post-op Note  Patient: [name]  Procedure(s) Performed:  TOTAL HIP ARTHROPLASTY  Patient Location: PACU  Anesthesia Type: General  Level of Consciousness: awake and alert   Airway and Oxygen Therapy: Patient Spontanous Breathing  Post-op Pain: mild  Post-op Assessment: Post-op Vital signs reviewed, Patient's Cardiovascular Status Stable, Respiratory Function Stable, Patent Airway and No signs of Nausea or vomiting  Post-op Vital Signs: stable  Complications: No apparent anesthesia complications 

## 2011-10-29 NOTE — Anesthesia Preprocedure Evaluation (Addendum)
Anesthesia Evaluation  Patient identified by MRN, date of birth, ID band Patient confused  General Assessment Comment:Mental status changes post IV MSO4 yesterday, have persisted today. No changes in speech or motor capabilities. Able to answer questions, wife explains this happened before after one of his back surgeries.   Reviewed: Allergy & Precautions, H&P , NPO status , Patient's Chart, lab work & pertinent test results  Airway Mallampati: II TM Distance: >3 FB Neck ROM: Full    Dental No notable dental hx.    Pulmonary neg pulmonary ROS,  clear to auscultation  Pulmonary exam normal       Cardiovascular hypertension, + CAD and neg cardio ROS Regular Normal Ef 45-50% 2010 echo   Neuro/Psych  Neuromuscular disease Negative Psych ROS   GI/Hepatic negative GI ROS, Neg liver ROS,   Endo/Other  Negative Endocrine ROSDiabetes mellitus-  Renal/GU negative Renal ROS  Genitourinary negative   Musculoskeletal negative musculoskeletal ROS (+)   Abdominal   Peds negative pediatric ROS (+)  Hematology negative hematology ROS (+)   Anesthesia Other Findings   Reproductive/Obstetrics negative OB ROS                          Anesthesia Physical Anesthesia Plan  ASA: III  Anesthesia Plan: General   Post-op Pain Management:    Induction: Intravenous  Airway Management Planned: Oral ETT  Additional Equipment:   Intra-op Plan:   Post-operative Plan: Extubation in OR  Informed Consent: I have reviewed the patients History and Physical, chart, labs and discussed the procedure including the risks, benefits and alternatives for the proposed anesthesia with the patient or authorized representative who has indicated his/her understanding and acceptance.   Dental advisory given  Plan Discussed with: CRNA  Anesthesia Plan Comments:         Anesthesia Quick Evaluation

## 2011-10-29 NOTE — Anesthesia Procedure Notes (Addendum)
Procedure Name: Intubation Date/Time: 10/29/2011 5:11 PM Performed by: Lurlean Leyden, DIANA L. Oxygen Delivery Method: Circle System Utilized Preoxygenation: Pre-oxygenation with 100% oxygen Intubation Type: IV induction Ventilation: Mask ventilation without difficulty and Oral airway inserted - appropriate to patient size Laryngoscope Size: Miller and 3 Grade View: Grade II Tube type: Oral Tube size: 8.0 mm Number of attempts: 1 Airway Equipment and Method: stylet Placement Confirmation: ETT inserted through vocal cords under direct vision,  breath sounds checked- equal and bilateral and positive ETCO2 Secured at: 22 cm Tube secured with: Tape Dental Injury: Teeth and Oropharynx as per pre-operative assessment

## 2011-10-29 NOTE — Progress Notes (Signed)
Subjective:      Patient with right femoral neck fracture  Patient reports pain as moderate.  Objective:   VITALS:   Filed Vitals:   10/29/11 0450  BP: 172/68  Pulse: 63  Temp: 98 F (36.7 C)  Resp: 18    Neurovascular intact Pain with movement of right LE  LABS  Basename 10/28/11 1334  HGB 11.4*  HCT 33.6*  WBC 7.1  PLT 239     Basename 10/28/11 1334  NA 138  K 4.2  BUN 26*  CREATININE 0.88  GLUCOSE 219*     Basename 10/28/11 1646  LABPT --  INR 1.18     Assessment/Plan:  Right femoral neck fracture Will plan for Right THR due preexisting advanced OA right hip  Will plan to transfer to [facility] for operation later today  NPO Consent order on chart     

## 2011-10-29 NOTE — Progress Notes (Signed)
Inpatient Diabetes Program Recommendations  AACE/ADA: New Consensus Statement on Inpatient Glycemic Control (2009)  Target Ranges:  Prepandial:   less than 140 mg/dL      Peak postprandial:   less than 180 mg/dL (1-2 hours)      Critically ill patients:  140 - 180 mg/dL   AM CBG today: 454 mg/dl after 10 units Lantus last evening.  Inpatient Diabetes Program Recommendations Insulin - Basal: Please increase Lantus to 20 units QHS.

## 2011-10-29 NOTE — Preoperative (Signed)
Beta Blockers   Reason not to administer Beta Blockers:Not Applicable 

## 2011-10-29 NOTE — Op Note (Signed)
NAME:Cortavious MARIS BENA  MEDICAL RECORD WU.:[id number]   LOCATION: 1602 FACILITY: [facility]   DATE OF BIRTH: 04/20/2028  PHYSICIAN: Madlyn Frankel. Charlann Boxer, M.D.    DATE OF PROCEDURE: 10/29/2011     OPERATIVE REPORT:   PREOPERATIVE DIAGNOSIS: Right hip osteoarthritis associated with acute femoral neck fracture.   POSTOPERATIVE DIAGNOSIS: Right hip osteoarthritis associated with acute femoral neck fracture .   PROCEDURE: Right total hip replacement utilizing DePuy components size  54 Pinnacle cup, 36+4 neutral Altrx liner, a size 7Hi Tri-Lock stem  with a 36+5 aSphere metal head.   SURGEON: Madlyn Frankel. Charlann Boxer, M.D.   ASSISTANT: Lanney Gins, PA-C.  Please note that physician assistant, Lanney Gins, was present for the entire case from  preoperative positioning to perioperative retractive management, general  facilitation and management of the operative extremity during the case  as well as general facilitation of the case and primary wound closure.   ANESTHESIA: GET   SPECIMENS: None.   COMPLICATIONS: None.   ESTIMATED BLOOD LOSS: 400cc.   DRAINS: One Hemovac.   INDICATION FOR PROCEDURE: The patient is a 77 y.o. @gender @ patient of mine  with history of Right hip osteoarthritis. Unfortunately Mr Iles fell on to his right hip while out of the state.  He presented to the ER with hip pain and radiographs revealed femoral neck fracture in setting of severe arthritis with near ankylosis.  After reviewing the  risks infection, DVT, dislocation, and need for revision surgeries, consent was  obtained for benefit of pain relief for fracture care and the already present osteoarthiritis  PROCEDURE IN DETAIL: The patient was brought to the operative theater.  Once adequate anesthesia, preop antibiotics, 2gm Ancef administered, the  patient was positioned in the left lateral decubitus position with the  right side up using the peg board positioner.  The right lower extremity was prepped and draped  in  sterile fashion.  Time-out was performed identifying the patient,  planned procedure, and extremity.   A lateral incision was made based off the proximal trochanter. Sharp dissection was carried down to the level of the iliotibial band and gluteal fascia.  The iliotibial band and gluteal fascia were then opened through a  posterior approach in the hip.  The posterior aspect of the hip was exposed. The short external  rotators were taken down separate from the posterior capsule which was  preserved to help protect the sciatic nerve from retractors, and also to be potentially repaired at the end of the procedure. Following this, an L capsulotomy was made referenced off the superior femoral neck.  The hip was then dislocated. A femoral neck osteotomy was made based on anatomic landmarks and pre-operative templating.  The femoral head was removed and advanced degenerative changes were noted on both the femoral head and acetabulum.   Femoral preparation was begun, retractors were placed and the proximal  femur was opened with the starting drill, I then hand reamed once and irrigated to  try to prevent fat emboli.  I began broaching with 0 broach and broached up initially to a size 7  broach. I packed off the femur with a sponge and then directed my attention to the acetabulum.  Acetabular retractors were placed. The labrum and foveal tissue was debrided. In  addition, there was a portion of the superior capsule removed to allow  for exposure of the cup.  I then began reaming with a 48 reamer and reamed up  to a 53 reamer with excellent bony bed  preparation.  A 54 Pinnacle cup was  chosen. The cup was subsequently impacted with good initial  scratch fit fixation.  A single screw were placed.  The hole eliminator was placed and the final 36+4 Altrx liner was impacted  with good visualized rim fit.   Please note that I did assess the cup position using the anatomic  landmarks present  including acetabular rim as well as my hip guide  indicating position about 20 degrees of forward flexion and 35-40  degrees of abduction.   At this point, I turned to the femur. The size 7 broach was placed in the femur.  Based on the pre-operative radiographs and templating, I chose a high  offset neck. A trial reduction was now carried out with a high offset  neck and 36+1.5 head. On inspection of range of motion, there was some  evidence of impingement but leg lengths appeared to be  equal to the contralateral leg as I compare them to the preoperative  state. Given all these findings, the trial components were removed.  The final 7Hi Tri-Lock stem was chosen. After irrigating the femoral  canal, the final stem was impacted and sat at the level of the final broach. Based on this and the trial reduction, a 36+5 aSphere ball was chosen, impacted onto a clean dry trunnion.   At this point, the hip was reduced. The hip had been irrigated  throughout the case and again at this point. We reapproximated the capsule to itself using a #1 vicryl.  I placed a medium Hemovac drain deep. The iliotibial band and gluteal  fascia were then reapproximated using #1 Vicryl. The remaining wound  was closed with 2-0 Vicryl and running 4-0 Monocryl.   The hip was cleaned, dried, and dressed sterilely using Dermabond and Aquacel  dressing. The patient's drain site dressed separately. The patient was  brought to the recovery room in stable condition, tolerating the  procedure well.     Madlyn Frankel Charlann Boxer, M.D.

## 2011-10-29 NOTE — H&P (View-Only) (Signed)
Subjective:      Patient with right femoral neck fracture  Patient reports pain as moderate.  Objective:   VITALS:   Filed Vitals:   10/29/11 0450  BP: 172/68  Pulse: 63  Temp: 98 F (36.7 C)  Resp: 18    Neurovascular intact Pain with movement of right LE  LABS  Basename 10/28/11 1334  HGB 11.4*  HCT 33.6*  WBC 7.1  PLT 239     Basename 10/28/11 1334  NA 138  K 4.2  BUN 26*  CREATININE 0.88  GLUCOSE 219*     Basename 10/28/11 1646  LABPT --  INR 1.18     Assessment/Plan:  Right femoral neck fracture Will plan for Right THR due preexisting advanced OA right hip  Will plan to transfer to [facility] for operation later today  NPO Consent order on chart

## 2011-10-29 NOTE — Interval H&P Note (Signed)
History and Physical Interval Note:  10/29/2011 4:58 PM  [name]  has presented today for surgery, with the diagnosis of Right Hip Fracture  The various methods of treatment have been discussed with the patient and family. After consideration of risks, benefits and other options for treatment, the patient has consented to  Procedure(s): RIGHT TOTAL HIP ARTHROPLASTY as a surgical intervention .  The patients' history has been reviewed, patient examined, no change in status, stable for surgery.  I have reviewed the patients' chart and labs.  Questions were answered to the patient's satisfaction.     Shelda Pal

## 2011-10-29 NOTE — Progress Notes (Signed)
Subjective: RN reports some confusion overnight after receiving Morphine.  Patient is without complaint currently.  Denies cough, shortness of breath, chest pain.  Pain control is adequate.  Objective: Vital signs in last 24 hours: Temp:  [97.8 F (36.6 C)-98.7 F (37.1 C)] 98 F (36.7 C) (12/07 0450) Pulse Rate:  [56-63] 63  (12/07 0450) Resp:  [16-18] 18  (12/07 0450) BP: (134-172)/(56-69) 172/68 mmHg (12/07 0450) SpO2:  [92 %-97 %] 95 % (12/07 0450) Weight:  [90.266 kg (199 lb)] 199 lb (90.266 kg) (12/07 0300) Weight change:  Last BM Date: 10/28/11  CBG (last 3)   Basename 10/29/11 0531 10/28/11 2232  GLUCAP 224* 195*    Intake/Output from previous day: 12/06 0701 - 12/07 0700 In: 507.5 [I.V.:507.5] Out: 1150 [Urine:1150] Intake/Output this shift:    General appearance: alert and no distress Eyes: no scleral icterus Throat: oropharynx moist without erythema Resp: clear to auscultation bilaterally Cardio: regular rate and rhythm, S1, S2 normal, no murmur, click, rub or gallop GI: soft, non-tender; bowel sounds normal; no masses,  no organomegaly Extremities: no clubbing, cyanosis or edema Neuro: alert, appropriate.   Lab Results:  [facility] 10/28/11 1334  NA 138  K 4.2  CL 102  CO2 28  GLUCOSE 219*  BUN 26*  CREATININE 0.88  CALCIUM 9.6  MG --  PHOS --    Basename 10/28/11 1334  AST 21  ALT 17  ALKPHOS 75  BILITOT 0.4  PROT 6.5  ALBUMIN 3.5    Basename 10/28/11 1334  WBC 7.1  NEUTROABS 4.4  HGB 11.4*  HCT 33.6*  MCV 98.0  PLT 239   Lab Results  Component Value Date   INR 1.18 10/28/2011   No results found for this basename: CKTOTAL:3,CKMB:3,CKMBINDEX:3,TROPONINI:3 in the last 72 hours No results found for this basename: TSH,T4TOTAL,FREET3,T3FREE,THYROIDAB in the last 72 hours No results found for this basename: VITAMINB12:2,FOLATE:2,FERRITIN:2,TIBC:2,IRON:2,RETICCTPCT:2 in the last 72 hours  Studies/Results: Dg Lumbar Spine  Complete  10/28/2011  *RADIOLOGY REPORT*  Clinical Data: Chronic low back pain.  LUMBAR SPINE - COMPLETE 4+ VIEW  Comparison: 12/12/2009.  Findings: The patient has undergone prior lumbar fusion.  Hardware is seen at L2-L3.  There is slight loosening of the screws.  There is nonunion across the interspace.  There is a non instrumented fusion at L1-L2.  There is severe degenerative disc disease L3-4, L4-5 as well as L5-S1.  Nonaneurysmal vascular calcification of the aorta is noted. There is a 2 mm degenerative anterolisthesis L3 on L4.  IMPRESSION: Post fusion changes as described.  There is a nonunion at L2-L3. There is no acute compression fracture.  Original Report Authenticated By: Elsie Stain, M.D.   Dg Hip Complete Right  10/28/2011  *RADIOLOGY REPORT*  Clinical Data: Chronic back pain.  Sharp pain right lateral hip.  RIGHT HIP - COMPLETE 2+ VIEW  Comparison: 08/13/2006.  Findings: There is a nondisplaced fracture of the right femoral neck extending obliquely from the lateral aspect of the femoral head towards the lesser trochanter. This appearance was not present in 2007.  Severe degenerative joint disease right hip with bone on bone and sclerotic changes of the acetabulum and femoral head.  Mild degenerative change left hip.  Previous lumbar surgery with degenerative disc disease.  Healed pelvic fractures of the right pubis and ischium.  IMPRESSION: Nondisplaced fracture of the right femoral neck.  Severe DJD right hip.  Original Report Authenticated By: Elsie Stain, M.D.   Dg Chest Portable 1 View  10/28/2011  *RADIOLOGY REPORT*  Clinical Data: Preop for repair of fractured hip  PORTABLE CHEST - 1 VIEW  Comparison: Chest x-ray of 06/13/2011  Findings: The lungs are not optimally aerated with some elevation of the right hemidiaphragm which appears chronic.  Cardiomegaly is stable.  Median sternotomy sutures are noted from prior CABG.  IMPRESSION: Stable cardiomegaly.  No active lung disease.   Suboptimal aeration.  Original Report Authenticated By: Juline Patch, M.D.     Medications: Scheduled:   . amLODipine  2.5 mg Oral Daily  . antiseptic oral rinse  15 mL Mouth Rinse q12n4p  . atenolol  50 mg Oral Daily  . chlorhexidine  15 mL Mouth Rinse BID  . esomeprazole  40 mg Oral Q1200  . fenofibrate  160 mg Oral Daily  . fentaNYL  50 mcg Intravenous Once  . gabapentin  300 mg Oral TID  . insulin aspart  0-15 Units Subcutaneous TID WC  . insulin aspart  0-5 Units Subcutaneous QHS  . insulin glargine  10 Units Subcutaneous QHS  . lidocaine   Urethral Once  . lisinopril  20 mg Oral Daily  . ranolazine  500 mg Oral BID  . rosuvastatin  10 mg Oral q1800  . DISCONTD: NON FORMULARY 40 mg  40 mg Oral Daily  . DISCONTD: simvastatin  40 mg Oral QHS   Continuous:   . sodium chloride 999 mL (10/28/11 1332)  . sodium chloride 75 mL/hr at 10/29/11 0344    Assessment/Plan: Principal Problem: 1. *Fracture of femoral neck, right- anticipate transfer to [facility] for THR by Dr. Charlann Boxer today.  Ok to proceed with surgery.  Start DVT prophylaxis ASAP after surgery once hemostasis obtained. Active Problems: 2. HTN (hypertension)- BP elevated this am.  Will continue to monitor on home regimen.  Consider increase in Norvasc if remains elevated. Bradycardia limits dose escalation of atenolol. 3. Diabetes mellitus with neurological manifestation- sugars are a bit high but will continue lower Lantus dose until tolerating pos. 4. CAD s/p CABG- continue medical therapy. 5. Anemia- mild anemia on admission.  Recheck post-op to monitor for acute blood loss.    LOS: 1 day   Jireh Elmore,W DOUGLAS 10/29/2011, 8:57 AM

## 2011-10-29 NOTE — Anesthesia Postprocedure Evaluation (Signed)
  Anesthesia Post-op Note  Patient: [name]  Procedure(s) Performed:  TOTAL HIP ARTHROPLASTY  Patient Location: PACU  Anesthesia Type: General  Level of Consciousness: awake and alert   Airway and Oxygen Therapy: Patient Spontanous Breathing  Post-op Pain: mild  Post-op Assessment: Post-op Vital signs reviewed, Patient's Cardiovascular Status Stable, Respiratory Function Stable, Patent Airway and No signs of Nausea or vomiting  Post-op Vital Signs: stable  Complications: No apparent anesthesia complications

## 2011-10-29 NOTE — Progress Notes (Signed)
PT note Pt to transfer to [facility] today for surgery.  PT eval deferred until after surgery.

## 2011-10-29 NOTE — Transfer of Care (Signed)
Immediate Anesthesia Transfer of Care Note  Patient: [name]  Procedure(s) Performed:  TOTAL HIP ARTHROPLASTY  Patient Location: PACU  Anesthesia Type: General  Level of Consciousness: alert  and sedated  Airway & Oxygen Therapy: Patient Spontanous Breathing and Patient connected to face mask oxygen  Post-op Assessment: Report given to PACU RN and Post -op Vital signs reviewed and stable  Post vital signs: Reviewed and stable  Complications: No apparent anesthesia complications

## 2011-10-29 NOTE — Progress Notes (Signed)
Chaplain responded to request from pt's spouse to visit pt.  Pt's spouse was present at bedside.  Pt was asleep in bed and did not rouse up during this visit.  Pt's spouse said that pt had not had a restful night and had just fallen asleep.  Pt was preparing for transport to [facility] where he was scheduled for hip replacement surgery.  Chaplain provided spiritual comfort and support for pt's spouse and had prayer for both.   10/29/11 1430  Clinical Encounter Type  Visited With Patient and family together  Visit Type Spiritual support  Referral From Family  Spiritual Encounters  Spiritual Needs Prayer  Stress Factors  Patient Stress Factors Major life changes;Health changes  Family Stress Factors Exhausted;Major life changes (Pt spouse will be primary care giver during pt recovery)    Verdie Shire, chaplain resident [phone]

## 2011-10-30 DIAGNOSIS — J189 Pneumonia, unspecified organism: Secondary | ICD-10-CM | POA: Diagnosis not present

## 2011-10-30 LAB — COMPREHENSIVE METABOLIC PANEL
ALT: 12 U/L (ref 0–53)
Albumin: 2.7 g/dL — ABNORMAL LOW (ref 3.5–5.2)
Alkaline Phosphatase: 54 U/L (ref 39–117)
GFR calc Af Amer: 64 mL/min — ABNORMAL LOW (ref >90)
Glucose, Bld: 332 mg/dL — ABNORMAL HIGH (ref 70–99)
Potassium: 4.3 mEq/L (ref 3.5–5.1)
Sodium: 135 mEq/L (ref 135–145)
Total Protein: 5.2 g/dL — ABNORMAL LOW (ref 6.0–8.3)

## 2011-10-30 LAB — GLUCOSE, CAPILLARY: Glucose-Capillary: 358 mg/dL — ABNORMAL HIGH (ref 70–99)

## 2011-10-30 LAB — CBC
HCT: 24.8 % — ABNORMAL LOW (ref 39.0–52.0)
MCHC: 34.7 g/dL (ref 30.0–36.0)
MCV: 96.5 fL (ref 78.0–100.0)
RDW: 13.1 % (ref 11.5–15.5)

## 2011-10-30 MED ORDER — INSULIN ASPART 100 UNIT/ML ~~LOC~~ SOLN
0.0000 [IU] | Freq: Three times a day (TID) | SUBCUTANEOUS | Status: DC
  Administered 2011-10-31 (×3): 5 [IU] via SUBCUTANEOUS
  Administered 2011-11-01: 8 [IU] via SUBCUTANEOUS
  Administered 2011-11-01: 5 [IU] via SUBCUTANEOUS
  Administered 2011-11-01 – 2011-11-02 (×2): 3 [IU] via SUBCUTANEOUS
  Administered 2011-11-02: 5 [IU] via SUBCUTANEOUS

## 2011-10-30 MED ORDER — OXYCODONE-ACETAMINOPHEN 5-325 MG PO TABS: 1.0000 | ORAL_TABLET | ORAL | Status: DC | PRN

## 2011-10-30 MED ORDER — INSULIN ASPART 100 UNIT/ML ~~LOC~~ SOLN
0.0000 [IU] | Freq: Every day | SUBCUTANEOUS | Status: DC
  Administered 2011-10-30: 5 [IU] via SUBCUTANEOUS
  Administered 2011-10-31: 4 [IU] via SUBCUTANEOUS
  Administered 2011-11-01: 3 [IU] via SUBCUTANEOUS

## 2011-10-30 MED ORDER — INSULIN GLARGINE 100 UNIT/ML ~~LOC~~ SOLN
15.0000 [IU] | Freq: Every day | SUBCUTANEOUS | Status: DC
  Administered 2011-10-30: 15 [IU] via SUBCUTANEOUS

## 2011-10-30 MED ORDER — INSULIN ASPART 100 UNIT/ML ~~LOC~~ SOLN
16.0000 [IU] | Freq: Once | SUBCUTANEOUS | Status: AC
  Administered 2011-10-30: 16 [IU] via SUBCUTANEOUS

## 2011-10-30 NOTE — Progress Notes (Signed)
Physical Therapy Treatment Patient Details Name: MARISA HUFSTETLER MRN: [id number] DOB: 1928/01/30 Today's Date: 10/30/2011 1721 - 1738  GT PT Assessment/Plan  PT - Assessment/Plan PT Plan: Discharge plan remains appropriate PT Frequency: 7X/week Follow Up Recommendations: Home health PT;Skilled nursing facility PT Goals  Acute Rehab PT Goals Time For Goal Achievement: 7 days Pt will go Supine/Side to Sit: with min assist;with HOB 0 degrees PT Goal: Supine/Side to Sit - Progress: Not met Pt will go Sit to Supine/Side: with min assist;with HOB 0 degrees PT Goal: Sit to Supine/Side - Progress: Not met Pt will go Sit to Stand: with supervision PT Goal: Sit to Stand - Progress: Not Progressing Pt will go Stand to Sit: with supervision PT Goal: Stand to Sit - Progress: Not met Pt will Ambulate: 51 - 150 feet;with rolling walker PT Goal: Ambulate - Progress: Not met  PT Treatment Precautions/Restrictions  Precautions Precautions: Posterior Hip;Fall Precaution Comments: sign in room Restrictions Weight Bearing Restrictions: Yes RLE Weight Bearing: Weight bearing as tolerated Mobility (including Balance) Bed Mobility Sit to Supine - Right: 1: +2 Total assist Sit to Supine - Right Details (indicate cue type and reason): cues for sequence, technique and THP - pt 40% Transfers Sit to Stand: 1: +2 Total assist;With upper extremity assist;With armrests;From chair/3-in-1 Sit to Stand Details (indicate cue type and reason): cues for use of UEs , LE position and THP - pt 60% Stand to Sit: 1: +2 Total assist;To bed;With upper extremity assist Stand to Sit Details: cues for use of UEs , LE position and THP - pt 60% Ambulation/Gait Ambulation/Gait Assistance: 1: +2 Total assist Ambulation/Gait Assistance Details (indicate cue type and reason): cues for sequende, stride length, position from RW, posture and increased UE WB - pt 70% Ambulation Distance (Feet): 5 Feet Assistive device: Rolling  walker Gait Pattern: Step-to pattern    Exercise    End of Session PT - End of Session Equipment Utilized During Treatment: Gait belt Activity Tolerance: Patient limited by fatigue;Patient limited by pain Patient left: in bed;with call bell in reach;with family/visitor present General Behavior During Session: [facility] for tasks performed Cognition: [facility] for tasks performed  Saketh Daubert 10/30/2011, 5:56 PM

## 2011-10-30 NOTE — Progress Notes (Signed)
Subjective:  [name] is without complaints at the present time. He is specifically denies any pain. He does remember eating anything today. Staff confirms that he has been persistently confused. He is asking to go home. He hasn't seen her memory at a separate time. He denies any breathing trouble or chest pain.  Objective: Vital signs in last 24 hours: Temp:  [97 F (36.1 C)-99 F (37.2 C)] 98.7 F (37.1 C) (12/08 1011) Pulse Rate:  [56-71] 65  (12/08 1011) Resp:  [12-18] 16  (12/08 1011) BP: (125-187)/(57-74) 130/62 mmHg (12/08 1011) SpO2:  [95 %-100 %] 97 % (12/08 1011)  Intake/Output from previous day: 12/07 0701 - 12/08 0700 In: 2920 [P.O.:120; I.V.:2800] Out: 1460 [Urine:475; Drains:410; Blood:575] Intake/Output this shift: Total I/O In: 240 [P.O.:240] Out: 200 [Urine:200]  General: alert he is sitting up in no distress. Sclera anicteric. Oral murmurs are moist. Neck is supple without bruits. Lungs are clear without wheezes rales or rhonchi. No accessory muscles are in use. Heart is regular with occasional skips. A well-healed scar is present suggesting prior bypass surgery. Abdomen soft nontender. Bowel sounds are hypoactive. Extremities her pulses relative well-preserved with no edema. The patient's awake he makes good eye contact no nystagmus is present speech is one-word to 2 words. He is confused but doesn't appear to understand what I'm saying and answers my questions. Grip is equal bilaterally.  Lab Results   [facility] 10/28/11 1334  WBC 7.1  RBC 3.43*  HGB 11.4*  HCT 33.6*  MCV 98.0  MCH 33.2  RDW 13.6  PLT 239    Basename 10/28/11 1334  NA 138  K 4.2  CL 102  CO2 28  GLUCOSE 219*  BUN 26*  CREATININE 0.88  CALCIUM 9.6    Studies/Results: Dg Lumbar Spine Complete  10/28/2011  *RADIOLOGY REPORT*  Clinical Data: Chronic low back pain.  LUMBAR SPINE - COMPLETE 4+ VIEW  Comparison: 12/12/2009.  Findings: The patient has undergone prior lumbar fusion.   Hardware is seen at L2-L3.  There is slight loosening of the screws.  There is nonunion across the interspace.  There is a non instrumented fusion at L1-L2.  There is severe degenerative disc disease L3-4, L4-5 as well as L5-S1.  Nonaneurysmal vascular calcification of the aorta is noted. There is a 2 mm degenerative anterolisthesis L3 on L4.  IMPRESSION: Post fusion changes as described.  There is a nonunion at L2-L3. There is no acute compression fracture.  Original Report Authenticated By: Elsie Stain, M.D.   Dg Hip Complete Right  10/28/2011  *RADIOLOGY REPORT*  Clinical Data: Chronic back pain.  Sharp pain right lateral hip.  RIGHT HIP - COMPLETE 2+ VIEW  Comparison: 08/13/2006.  Findings: There is a nondisplaced fracture of the right femoral neck extending obliquely from the lateral aspect of the femoral head towards the lesser trochanter. This appearance was not present in 2007.  Severe degenerative joint disease right hip with bone on bone and sclerotic changes of the acetabulum and femoral head.  Mild degenerative change left hip.  Previous lumbar surgery with degenerative disc disease.  Healed pelvic fractures of the right pubis and ischium.  IMPRESSION: Nondisplaced fracture of the right femoral neck.  Severe DJD right hip.  Original Report Authenticated By: Elsie Stain, M.D.   Dg Pelvis Portable  10/29/2011  *RADIOLOGY REPORT*  Clinical Data: Postop right hip replacement  PORTABLE PELVIS  Comparison: 10/28/2011  Findings: Right total hip arthroplasty.  No evidence of hardware complication.  Associated soft tissue gas and a surgical drain.  No fracture is seen.  IMPRESSION: Right total hip arthroplasty, without evidence of hardware complication.  Original Report Authenticated By: Charline Bills, M.D.   Dg Chest Portable 1 View  10/28/2011  *RADIOLOGY REPORT*  Clinical Data: Preop for repair of fractured hip  PORTABLE CHEST - 1 VIEW  Comparison: Chest x-ray of 06/13/2011  Findings: The  lungs are not optimally aerated with some elevation of the right hemidiaphragm which appears chronic.  Cardiomegaly is stable.  Median sternotomy sutures are noted from prior CABG.  IMPRESSION: Stable cardiomegaly.  No active lung disease.  Suboptimal aeration.  Original Report Authenticated By: Juline Patch, M.D.   Dg Hip Portable 1 View Right  10/29/2011  *RADIOLOGY REPORT*  Clinical Data: Postop right hip replacement  PORTABLE RIGHT HIP - 1 VIEW  Comparison: 10/28/2011  Findings: Right total hip arthroplasty without evidence of hardware complication.  Associated subcutaneous gas surgical drain.  Surgical clips in the right right.  IMPRESSION: Right total hip arthroplasty without evidence of hardware complication.  Original Report Authenticated By: Charline Bills, M.D.    Scheduled Meds:   . amLODipine  2.5 mg Oral Daily  . antiseptic oral rinse  15 mL Mouth Rinse q12n4p  . atenolol  50 mg Oral Daily  . bupivacaine liposome  20 mL Infiltration To OR  . ceFAZolin (ANCEF) IV  1 g Intravenous Q6H  . docusate sodium  100 mg Oral BID  . esomeprazole  40 mg Oral Q1200  . fenofibrate  160 mg Oral Daily  . ferrous sulfate  325 mg Oral TID PC  . gabapentin  300 mg Oral TID  . insulin aspart  0-15 Units Subcutaneous TID WC  . insulin glargine  10 Units Subcutaneous QHS  . ketorolac  7.5 mg Intravenous Q6H  . lisinopril  20 mg Oral Daily  . polyethylene glycol  17 g Oral BID  . ranolazine  500 mg Oral BID  . rivaroxaban  10 mg Oral Q24H  . rosuvastatin  10 mg Oral q1800  . traMADol  50-100 mg Oral Q6H  . traMADol  50-100 mg Oral Once  . DISCONTD: chlorhexidine  15 mL Mouth Rinse BID  . DISCONTD: insulin aspart  0-15 Units Subcutaneous TID WC  . DISCONTD: insulin aspart  0-5 Units Subcutaneous QHS  . DISCONTD: traMADol  50-100 mg Oral Q6H   Continuous Infusions:   . sodium chloride 0.9 % 1,000 mL with potassium chloride 10 mEq infusion 100 mL/hr (10/30/11 0317)  . DISCONTD: sodium  chloride 999 mL (10/28/11 1332)  . DISCONTD: sodium chloride Stopped (10/29/11 1646)  . DISCONTD: lactated ringers 75 mL/hr (10/29/11 1929)   PRN Meds:acetaminophen, acetaminophen, alum & mag hydroxide-simeth, bisacodyl, diphenhydrAMINE, menthol-cetylpyridinium, methocarbamol(ROBAXIN) IV, methocarbamol, metoCLOPramide (REGLAN) injection, metoCLOPramide, ondansetron (ZOFRAN) IV, ondansetron, oxyCODONE-acetaminophen, phenol, sodium phosphate, zolpidem, DISCONTD: fentaNYL, DISCONTD: morphine, DISCONTD: ondansetron, DISCONTD: promethazine, DISCONTD: sodium chloride, DISCONTD: traMADol  Assessment/Plan: Acute confusion: This appears to more of a delirium perhaps due to the medications he received for pain or perhaps due to the anesthesia. His exam is nonfocal. I need to check his electrolytes to make sure there isn't some problem there. I don't see any indication yet to scan his head. We will follow him along. Patient Active Problem List  Diagnoses  . CAD (coronary artery disease) this appears to be clinically stable.   . Fracture of femoral neck, right: He is status post repair   . HTN (hypertension): Blood pressure is  reasonable   . Diabetes mellitus with neurological manifestation and blood sugars up at 311. I will adjust his medications. :   And       LOS: 2 days   Catalino Plascencia ALAN 10/30/2011, 12:24 PM

## 2011-10-30 NOTE — Progress Notes (Signed)
Physical Therapy Evaluation Patient Details Name: [name] MRN: [id number] DOB: 03-02-1928 Today's Date: 10/30/2011 [id number] eval 2  Problem List:  Patient Active Problem List  Diagnoses  . CAD (coronary artery disease)  . Fracture of femoral neck, right  . HTN (hypertension)  . Diabetes mellitus with neurological manifestation  . Confusion    Past Medical History:  Past Medical History  Diagnosis Date  . Hypertension   . Diabetes mellitus   . Dyslipidemia   . Coronary artery disease     status post CABG. He is also status post PTCA and stenting of the circumflex artery  . Heart murmur   . Renal calculi   . Peripheral autonomic neuropathy due to diabetes mellitus    Past Surgical History:  Past Surgical History  Procedure Date  . Lithotripsy   . US echocardiography 01-16-09    EF 55-60  . Cardiovascular stress test 09-26-03    EF 43%  . Cardiac catheterization   . Coronary artery bypass graft     CABG X 4  . Coronary angioplasty with stent placement   . Tonsillectomy   . Cataract extraction w/ intraocular lens implant     right  . Back surgery     x6    PT Assessment/Plan/Recommendation PT Assessment Clinical Impression Statement: Pt presents s/p Right THA after FNF(pt had severe OA so proceeded with total hip). Pt is confused and disoriented and states he "did NOT have surgery!!". pt will benefit from PT to maximize independence for the next venue of care  PT Recommendation/Assessment: Patient will need skilled PT in the acute care venue PT Problem List: Decreased strength;Decreased balance;Decreased mobility;Decreased activity tolerance;Decreased range of motion;Decreased knowledge of use of DME;Decreased safety awareness;Decreased knowledge of precautions PT Therapy Diagnosis : Difficulty walking;Acute pain PT Plan PT Frequency: 7X/week PT Treatment/Interventions: DME instruction;Gait training;Functional mobility training;Therapeutic  activities;Therapeutic exercise;Patient/family education;Stair training PT Recommendation Follow Up Recommendations: Home health PT;Skilled nursing facility (depending on progress and home support) Equipment Recommended:  (TBA) PT Goals  Acute Rehab PT Goals PT Goal Formulation: Patient unable to participate in goal setting Time For Goal Achievement: 7 days Pt will go Supine/Side to Sit: with min assist;with HOB 0 degrees PT Goal: Supine/Side to Sit - Progress: Progressing toward goal Pt will go Sit to Supine/Side: with min assist;with HOB 0 degrees PT Goal: Sit to Supine/Side - Progress: Other (comment) Pt will go Sit to Stand: with supervision PT Goal: Sit to Stand - Progress: Progressing toward goal Pt will go Stand to Sit: with supervision PT Goal: Stand to Sit - Progress: Progressing toward goal Pt will Ambulate: 51 - 150 feet;with rolling walker PT Goal: Ambulate - Progress: Progressing toward goal Pt will Go Up / Down Stairs:  (TBA once home environment determined) Pt will Perform Home Exercise Program: with supervision, verbal cues required/provided PT Goal: Perform Home Exercise Program - Progress: Progressing toward goal  PT Evaluation Precautions/Restrictions  Precautions Precautions: Posterior Hip;Fall Precaution Comments: sign in room Restrictions RLE Weight Bearing: Weight bearing as tolerated Prior Functioning  Home Living Lives With: Spouse Receives Help From:  (spouse) Type of Home:  (pt confused unable to give info regarding home environment) Home Layout:  (wife not present) Prior Function Level of Independence: Independent with gait;Independent with transfers (per RN from speaking with spouse) Cognition Cognition Arousal/Alertness: Other (comment) (pt alert but confused) Overall Cognitive Status: Appears within functional limits for tasks assessed Orientation Level: Disoriented to place;Disoriented to time;Disoriented to situation Cognition - Other  Comments: pt able to follow commands with verbal and visual cues although disoriented to situation; pt cooperative nonetheless Sensation/Coordination   Extremity Assessment RUE Assessment RUE Assessment: Within Functional Limits LUE Assessment LUE Assessment: Within Functional Limits RLE Assessment RLE Assessment:  (hip and knee grossly 2+/5, limited by pain; ankle WFL) LLE Assessment LLE Assessment: Within Functional Limits Mobility (including Balance) Bed Mobility Bed Mobility: Yes Supine to Sit: 1: +2 Total assist;HOB flat (pt=65%) Supine to Sit Details (indicate cue type and reason): cues for technique, THP Sitting - Scoot to Edge of Bed: 2: Max assist Transfers Sit to Stand: 1: +2 Total assist (pt= 50%) Sit to Stand Details (indicate cue type and reason): +2 for THP, safety, lunes, chair, wt shift; cues for THP, hands Stand to Sit: 1: +2 Total assist (pt=50%; +2 for control descent, safety, THP) Stand to Sit Details: cues for THP, hands Ambulation/Gait Ambulation/Gait: Yes Ambulation/Gait Assistance: 1: +2 Total assist (pt= 65%) Ambulation/Gait Assistance Details (indicate cue type and reason): +2 for balance, safety, lines, chair; visual and verbal cues for sequence, RW technique Ambulation Distance (Feet): 8 Feet Assistive device: Rolling walker Gait Pattern: Step-to pattern (wide BOS)    Exercise  Total Joint Exercises Ankle Circles/Pumps: AAROM;5 reps;Both Heel Slides: AAROM;Right;5 reps End of Session PT - End of Session Activity Tolerance: Patient limited by fatigue (limited by confusion) Patient left: in chair;with call bell in reach Nurse Communication: Mobility status for transfers;Mobility status for ambulation General Behavior During Session: Other (comment) (confused, follows commands) Cognition: WFL for tasks performed  [facility] 10/30/2011, 2:01 PM

## 2011-10-30 NOTE — Progress Notes (Signed)
Pt restless at times, increase confusion and agitation noted when next pain pill is due. The pt  Does try to get oob, bed alarm on , wife at bedside. Is resting will monitor.

## 2011-10-30 NOTE — Progress Notes (Signed)
Patient ID: [name], male   DOB: 1928-01-16, 77 y.o.   MRN: [id number] Hgb 11.4.Hemovac removed.Wife says he is normally confused and that pain meds are being  r estrictedcted because of this.  She is willing to accept more confusion for increased pain control.  Will add hydrocodone.   Fayrene Fearing Fawne Hughley

## 2011-10-30 NOTE — Progress Notes (Addendum)
.  Clinical social worker completed patient psychosocial assessment, please see assessment in patient shadow chart.  Pt is open to short term rehab at skilled nursing if needed pending progress with physical therapy. Pt prefers [facility] due to past experience with pt wife. CSW to complete FL2 and place in patient shadow chart for MD signature. CSW will fax clinicals to [facility]..Clinical social worker initiated skilled nursing facility search, see placement note in patient shadow chart.  Pt will discuss with CSW other snf options if needed. CSW to follow up on Monday.   Catha Gosselin, Theresia Majors  [phone] .10/30/2011 15:39 Weekend coverage [phone]

## 2011-10-31 LAB — CBC
HCT: 22.8 % — ABNORMAL LOW (ref 39.0–52.0)
MCH: 33.9 pg (ref 26.0–34.0)
MCHC: 35.1 g/dL (ref 30.0–36.0)
MCV: 96.6 fL (ref 78.0–100.0)
RDW: 13.2 % (ref 11.5–15.5)

## 2011-10-31 LAB — GLUCOSE, CAPILLARY
Glucose-Capillary: 228 mg/dL — ABNORMAL HIGH (ref 70–99)
Glucose-Capillary: 229 mg/dL — ABNORMAL HIGH (ref 70–99)

## 2011-10-31 LAB — BASIC METABOLIC PANEL
Calcium: 8.5 mg/dL (ref 8.4–10.5)
Glucose, Bld: 255 mg/dL — ABNORMAL HIGH (ref 70–99)

## 2011-10-31 MED ORDER — INSULIN GLARGINE 100 UNIT/ML ~~LOC~~ SOLN
18.0000 [IU] | Freq: Every day | SUBCUTANEOUS | Status: DC
  Administered 2011-10-31: 18 [IU] via SUBCUTANEOUS

## 2011-10-31 NOTE — Progress Notes (Signed)
Physical Therapy Treatment Patient Details Name: [name] MRN: [id number] DOB: [birth year] Today's Date: 10/31/2011 4098-1191 1te 1gt PT Assessment/Plan  PT - Assessment/Plan Comments on Treatment Session: pt much clearer this am, with increase pain during PT, RN notified and gave meds; pt with "pill rollling"type resting tremors in UEs today.  PT Plan: Discharge plan remains appropriate;Frequency remains appropriate PT Frequency: 7X/week Follow Up Recommendations: Skilled nursing facility Equipment Recommended: Defer to next venue PT Goals  Acute Rehab PT Goals PT Goal: Supine/Side to Sit - Progress: Progressing toward goal PT Goal: Sit to Supine/Side - Progress: Progressing toward goal PT Goal: Sit to Stand - Progress: Progressing toward goal PT Goal: Stand to Sit - Progress: Progressing toward goal PT Goal: Ambulate - Progress: Progressing toward goal PT Goal: Perform Home Exercise Program - Progress: Progressing toward goal  PT Treatment Precautions/Restrictions  Precautions Precautions: Posterior Hip;Fall Precaution Comments: sign in room Restrictions Weight Bearing Restrictions: Yes RLE Weight Bearing: Weight bearing as tolerated Mobility (including Balance) Bed Mobility Supine to Sit: 3: Mod assist;HOB elevated (Comment degrees) (30) Supine to Sit Details (indicate cue type and reason): cues for technique, THP Sitting - Scoot to Edge of Bed: 4: Min assist Transfers Sit to Stand: 3: Mod assist;From bed;With upper extremity assist Sit to Stand Details (indicate cue type and reason): cues for THP and use of UEs Stand to Sit: 3: Mod assist;To chair/3-in-1;With armrests Stand to Sit Details: cues for use of UEs , LE position and THP  Ambulation/Gait Ambulation/Gait Assistance: 3: Mod assist Ambulation/Gait Assistance Details (indicate cue type and reason): cues for posture, sequence, RW distance from self, and use of UEs Ambulation Distance (Feet): 38  Feet Assistive device: Rolling walker Gait Pattern: Step-to pattern;Antalgic    Exercise  Total Joint Exercises Ankle Circles/Pumps: AROM;AAROM;Both;10 reps;Supine Quad Sets: AROM;10 reps;Supine;Both Heel Slides: AAROM;Right;10 reps;Supine End of Session PT - End of Session Activity Tolerance: Patient limited by fatigue;Patient limited by pain Patient left: in chair;with call bell in reach Nurse Communication:  (pain meds) General Behavior During Session: [facility] for tasks performed Cognition: [facility] for tasks performed  [facility] 10/31/2011, 12:21 PM

## 2011-10-31 NOTE — Progress Notes (Signed)
Subjective: Patient denies any complaints other than the sore heels. Denies any shortness of breath chest pain or abdominal discomfort.   Objective: Vital signs in last 24 hours: Temp:  [98.7 F (37.1 C)-99.5 F (37.5 C)] 99.1 F (37.3 C) (12/09 0610) Pulse Rate:  [63-70] 70  (12/09 0610) Resp:  [16] 16  (12/09 0610) BP: (125-147)/(56-66) 125/62 mmHg (12/09 0610) SpO2:  [95 %-97 %] 95 % (12/09 0610)  Intake/Output from previous day: 12/08 0701 - 12/09 0700 In: 3008.3 [P.O.:600; I.V.:2408.3] Out: 600 [Urine:600] Intake/Output this shift:     Basename 10/31/11 0443 10/30/11 1400 10/28/11 1334  HGB 8.0* 8.6* 11.4*    Basename 10/31/11 0443 10/30/11 1400  WBC 7.2 6.8  RBC 2.36* 2.57*  HCT 22.8* 24.8*  PLT 197 226    Basename 10/31/11 0443 10/30/11 1400  NA 137 135  K 4.3 4.3  CL 105 102  CO2 27 27  BUN 31* 31*  CREATININE 1.22 1.20  GLUCOSE 255* 332*  CALCIUM 8.5 8.6    Basename 10/28/11 1646  LABPT --  INR 1.18    Patient is conscious alert appropriate sitting up watching TV he is completely eaten his meal from his plate and this completed his coffee. His right hip dressing is intact no signs of erythema or drainage around the dressing leave the dressing intact his thigh and calf are soft and nontender his heels are all off the bed elevated by a blanket rolls his head SCDs in place he is neurologically intact in the lower extremities  Assessment/Plan: Postop day #2 right hip fracture repaired with a total hip arthroplasty doing well Plan out of bed with physical therapy weightbearing as tolerated per total hip protocol continue to monitor her labs   Mimi Debellis W 10/31/2011, 8:45 AM

## 2011-10-31 NOTE — Progress Notes (Signed)
Subjective: Patient feels much better today. He is sitting up in the chair. He did well with therapy. Pain is at minimal. He notes no chest pain or shortness of breath.  Objective: Vital signs in last 24 hours: Temp:  [98.8 F (37.1 C)-99.5 F (37.5 C)] 99.1 F (37.3 C) (12/09 0610) Pulse Rate:  [63-70] 70  (12/09 0610) Resp:  [16] 16  (12/09 0610) BP: (125-147)/(56-66) 125/62 mmHg (12/09 0610) SpO2:  [95 %-97 %] 95 % (12/09 0610)  Intake/Output from previous day: 12/08 0701 - 12/09 0700 In: 3008.3 [P.O.:600; I.V.:2408.3] Out: 600 [Urine:600] Intake/Output this shift: Total I/O In: 240 [P.O.:240] Out: -   General: alert General: alert he is sitting up in no distress. Sclera anicteric. Oral murmurs are moist. Neck is supple without bruits. Lungs are clear without wheezes rales or rhonchi. No accessory muscles are in use. Heart is regular with occasional skips. A well-healed scar is present suggesting prior bypass surgery. Abdomen soft nontender. Bowel sounds are hypoactive. Extremities her pulses relative well-preserved with no edema. He is awake and sitting up looking good. He answers my questions appropriately and see me back to his normal mentation. He does remember talking with me yesterday became for further details. He did well with therapy. Number members this. No tremor is present   Lab Results   Basename 10/31/11 0443 10/30/11 1400  WBC 7.2 6.8  RBC 2.36* 2.57*  HGB 8.0* 8.6*  HCT 22.8* 24.8*  MCV 96.6 96.5  MCH 33.9 33.5  RDW 13.2 13.1  PLT 197 226    Basename 10/31/11 0443 10/30/11 1400  NA 137 135  K 4.3 4.3  CL 105 102  CO2 27 27  GLUCOSE 255* 332*  BUN 31* 31*  CREATININE 1.22 1.20  CALCIUM 8.5 8.6    Studies/Results: Dg Pelvis Portable  10/29/2011  *RADIOLOGY REPORT*  Clinical Data: Postop right hip replacement  PORTABLE PELVIS  Comparison: 10/28/2011  Findings: Right total hip arthroplasty.  No evidence of hardware complication.  Associated soft  tissue gas and a surgical drain.  No fracture is seen.  IMPRESSION: Right total hip arthroplasty, without evidence of hardware complication.  Original Report Authenticated By: Charline Bills, M.D.   Dg Hip Portable 1 View Right  10/29/2011  *RADIOLOGY REPORT*  Clinical Data: Postop right hip replacement  PORTABLE RIGHT HIP - 1 VIEW  Comparison: 10/28/2011  Findings: Right total hip arthroplasty without evidence of hardware complication.  Associated subcutaneous gas surgical drain.  Surgical clips in the right right.  IMPRESSION: Right total hip arthroplasty without evidence of hardware complication.  Original Report Authenticated By: Charline Bills, M.D.    Scheduled Meds:   . amLODipine  2.5 mg Oral Daily  . antiseptic oral rinse  15 mL Mouth Rinse q12n4p  . atenolol  50 mg Oral Daily  . docusate sodium  100 mg Oral BID  . esomeprazole  40 mg Oral Q1200  . fenofibrate  160 mg Oral Daily  . ferrous sulfate  325 mg Oral TID PC  . gabapentin  300 mg Oral TID  . insulin aspart  0-15 Units Subcutaneous TID WC  . insulin aspart  0-5 Units Subcutaneous QHS  . insulin aspart  16 Units Subcutaneous Once  . insulin glargine  18 Units Subcutaneous QHS  . ketorolac  7.5 mg Intravenous Q6H  . lisinopril  20 mg Oral Daily  . polyethylene glycol  17 g Oral BID  . ranolazine  500 mg Oral BID  . rivaroxaban  10 mg Oral Q24H  . rosuvastatin  10 mg Oral q1800  . traMADol  50-100 mg Oral Q6H  . DISCONTD: insulin aspart  0-15 Units Subcutaneous TID WC  . DISCONTD: insulin glargine  10 Units Subcutaneous QHS  . DISCONTD: insulin glargine  15 Units Subcutaneous QHS   Continuous Infusions:   . DISCONTD: sodium chloride 0.9 % 1,000 mL with potassium chloride 10 mEq infusion 100 mL/hr (10/31/11 0228)   PRN Meds:acetaminophen, acetaminophen, alum & mag hydroxide-simeth, bisacodyl, diphenhydrAMINE, menthol-cetylpyridinium, methocarbamol(ROBAXIN) IV, methocarbamol, metoCLOPramide (REGLAN) injection,  metoCLOPramide, ondansetron (ZOFRAN) IV, ondansetron, oxyCODONE-acetaminophen, phenol, zolpidem  Asessment/Plan:s Acute confusion: This is nearly resolved he is doing much better. I suspect this is a combination of acute illness, medications, and anesthesia. Patient Active Problem List   Diagnoses   .  CAD (coronary artery disease) this appears to be clinically stable.   .  Fracture of femoral neck, right: He is status post repair and doing well with therapy.   Marland Kitchen  HTN (hypertension): Blood pressure is reasonable   .  Diabetes mellitus with neurological manifestation : Blood sugars are improving. On 225 this morning. I will add a bit more Lantus. Anemia: This is blood loss related to surgery. We need to recheck in the morning with his hemoglobin only 8, he may need a transfusion in light of his heart disease. :         LOS: 3 days   [name] 10/31/2011, 12:20 PM

## 2011-10-31 NOTE — Progress Notes (Signed)
Order received, chart reviewed, noted pt for probable D/C to SNF possibly Monday(tomorrow); will defer OT eval to SNF unless D/C plan changes then will eval and educate pt on acute.  Ignacia Palma, [place] [phone] 10/31/2011

## 2011-11-01 ENCOUNTER — Encounter (HOSPITAL_COMMUNITY): Payer: Self-pay | Admitting: Orthopedic Surgery

## 2011-11-01 LAB — GLUCOSE, CAPILLARY
Glucose-Capillary: 244 mg/dL — ABNORMAL HIGH (ref 70–99)
Glucose-Capillary: 183 mg/dL — ABNORMAL HIGH (ref 70–99)
Glucose-Capillary: 254 mg/dL — ABNORMAL HIGH (ref 70–99)

## 2011-11-01 LAB — COMPREHENSIVE METABOLIC PANEL
Alkaline Phosphatase: 49 U/L (ref 39–117)
BUN: 32 mg/dL — ABNORMAL HIGH (ref 6–23)
CO2: 28 mEq/L (ref 19–32)
Chloride: 103 mEq/L (ref 96–112)
Creatinine, Ser: 1.15 mg/dL (ref 0.50–1.35)
GFR calc Af Amer: 67 mL/min — ABNORMAL LOW (ref >90)
GFR calc non Af Amer: 58 mL/min — ABNORMAL LOW (ref >90)
Glucose, Bld: 251 mg/dL — ABNORMAL HIGH (ref 70–99)
Total Bilirubin: 0.4 mg/dL (ref 0.3–1.2)

## 2011-11-01 LAB — CBC
HCT: 23.2 % — ABNORMAL LOW (ref 39.0–52.0)
Hemoglobin: 8 g/dL — ABNORMAL LOW (ref 13.0–17.0)
MCV: 97.1 fL (ref 78.0–100.0)
RBC: 2.39 MIL/uL — ABNORMAL LOW (ref 4.22–5.81)
RDW: 13.5 % (ref 11.5–15.5)
WBC: 7.2 10*3/uL (ref 4.0–10.5)

## 2011-11-01 LAB — PREPARE RBC (CROSSMATCH)

## 2011-11-01 MED ORDER — INSULIN GLARGINE 100 UNIT/ML ~~LOC~~ SOLN
35.0000 [IU] | Freq: Every day | SUBCUTANEOUS | Status: DC
  Administered 2011-11-01: 35 [IU] via SUBCUTANEOUS

## 2011-11-01 MED ORDER — FUROSEMIDE 10 MG/ML IJ SOLN
20.0000 mg | Freq: Once | INTRAMUSCULAR | Status: AC
  Administered 2011-11-01: 20 mg via INTRAVENOUS
  Filled 2011-11-01: qty 2

## 2011-11-01 MED ORDER — ACETAMINOPHEN 325 MG PO TABS
650.0000 mg | ORAL_TABLET | Freq: Once | ORAL | Status: AC
  Administered 2011-11-01: 650 mg via ORAL
  Filled 2011-11-01: qty 2

## 2011-11-01 MED ORDER — INSULIN NPH (HUMAN) (ISOPHANE) 100 UNIT/ML ~~LOC~~ SUSP
12.0000 [IU] | Freq: Once | SUBCUTANEOUS | Status: AC
  Administered 2011-11-01: 12 [IU] via SUBCUTANEOUS
  Filled 2011-11-01: qty 10

## 2011-11-01 NOTE — Progress Notes (Signed)
Occupational Therapy Evaluation Patient Details Name: [name] MRN: [id number] DOB: May 06, 1928 Today's Date: 11/01/2011 Time in: 10:09 am Time out: 10:40 am Eval II  Problem List:  Patient Active Problem List  Diagnoses  . CAD (coronary artery disease)  . Fracture of femoral neck, right  . HTN (hypertension)  . Diabetes mellitus with neurological manifestation  . Confusion    Past Medical History:  Past Medical History  Diagnosis Date  . Hypertension   . Diabetes mellitus   . Dyslipidemia   . Coronary artery disease     status post CABG. He is also status post PTCA and stenting of the circumflex artery  . Heart murmur   . Renal calculi   . Peripheral autonomic neuropathy due to diabetes mellitus    Past Surgical History:  Past Surgical History  Procedure Date  . Lithotripsy   . US echocardiography 01-16-09    EF 55-60  . Cardiovascular stress test 09-26-03    EF 43%  . Cardiac catheterization   . Coronary artery bypass graft     CABG X 4  . Coronary angioplasty with stent placement   . Tonsillectomy   . Cataract extraction w/ intraocular lens implant     right  . Back surgery     x6    OT Assessment/Plan/Recommendation OT Assessment Clinical Impression Statement: Patient will benefit from OT services to increase independence with ADL for next venue of care with ultimate goal to return home at modified independent level OT Recommendation/Assessment: Patient will need skilled OT in the acute care venue OT Problem List: Decreased strength;Decreased activity tolerance;Decreased knowledge of use of DME or AE;Decreased knowledge of precautions OT Therapy Diagnosis : Generalized weakness OT Plan OT Treatment/Interventions: Self-care/ADL training;Therapeutic activities;DME and/or AE instruction;Patient/family education OT Recommendation Follow Up Recommendations: Skilled nursing facility Equipment Recommended: Defer to next venue Individuals  Consulted Consulted and Agree with Results and Recommendations: Patient OT Goals Acute Rehab OT Goals OT Goal Formulation: With patient Time For Goal Achievement: 7 days ADL Goals Pt Will Perform Grooming: with min assist;Standing at sink ADL Goal: Grooming - Progress: Progressing toward goals Pt Will Perform Lower Body Bathing: with min assist;with adaptive equipment;Sit to stand from chair;Sit to stand from bed ADL Goal: Lower Body Bathing - Progress: Progressing toward goals Pt Will Perform Lower Body Dressing: with min assist;with adaptive equipment;Sit to stand from chair;Sit to stand from bed ADL Goal: Lower Body Dressing - Progress: Progressing toward goals Pt Will Transfer to Toilet: with min assist;with DME;3-in-1;Ambulation;Other (comment) (rolling walker) ADL Goal: Toilet Transfer - Progress: Progressing toward goals Pt Will Perform Toileting - Clothing Manipulation: with min assist;Standing ADL Goal: Toileting - Clothing Manipulation - Progress: Progressing toward goals Pt Will Perform Toileting - Hygiene: with min assist;Sit to stand from 3-in-1/toilet ADL Goal: Toileting - Hygiene - Progress: Progressing toward goals Additional ADL Goal #1: Pt will independently state all THPs. ADL Goal: Additional Goal #1 - Progress: Progressing toward goals  OT Evaluation Precautions/Restrictions  Precautions Precautions: Posterior Hip Precaution Comments: sign in room Restrictions Weight Bearing Restrictions: No RLE Weight Bearing: Weight bearing as tolerated Prior Functioning Home Living Lives With: Spouse Receives Help From:  (spouse) Type of Home:  (pt confused unable to give info regarding home environment) Home Layout:  (wife not present) Bathroom Shower/Tub: Engineer, manufacturing systems: Standard (uses 3in1 over toilet PTA) Home Adaptive Equipment: Shower chair with back;Reacher;Bedside commode/3-in-1;Long-handled sponge;Long-handled shoehorn;Sock aid Prior  Function Level of Independence: Independent with gait;Independent with transfers;Independent with  basic ADLs (per RN from speaking with spouse) ADL ADL Eating/Feeding: Simulated;Independent Where Assessed - Eating/Feeding: Chair Grooming: Simulated;Wash/dry hands;Set up Where Assessed - Grooming: Sitting, chair Upper Body Bathing: Simulated;Chest;Right arm;Left arm;Abdomen;Supervision/safety;Set up Upper Body Bathing Details (indicate cue type and reason): some dizziness at EOB. supervision for safety Where Assessed - Upper Body Bathing: Sitting, bed;Unsupported Lower Body Bathing: Maximal assistance Lower Body Bathing Details (indicate cue type and reason): without adaptive equipment.  Where Assessed - Lower Body Bathing: Sit to stand from bed Upper Body Dressing: Simulated;Minimal assistance Where Assessed - Upper Body Dressing: Sitting, bed;Unsupported Lower Body Dressing: +1 Total assistance Lower Body Dressing Details (indicate cue type and reason): without adaptive equipment. demonstrated how to use all AE. Will need to practice but patient is familiar from wife using AE for her hip surgery. Where Assessed - Lower Body Dressing: Sit to stand from bed Toilet Transfer: Simulated;Moderate assistance Toilet Transfer Details (indicate cue type and reason): mod verbal cues for technique, hand placement, RW sequence. Toilet Transfer Method: Stand pivot;Other (comment) (to chair only due to dizziness/weakness) Toileting - Clothing Manipulation: Simulated;+1 Total assistance Where Assessed - Toileting Clothing Manipulation: Standing Toileting - Hygiene: Simulated;+1 Total assistance Where Assessed - Toileting Hygiene: Standing Tub/Shower Transfer: Not assessed Tub/Shower Transfer Method: Not assessed Equipment Used: Rolling walker;Reacher;Sock aid;Long-handled sponge;Long-handled shoe horn;Other (comment) (used RW. Demonstrated AE.) ADL Comments: Pt very fatigued with transfer from EOB to  chair. Also dizzy at EOB initially. Pt states dizziness resolved after 1-2 minutes sitting EOB.  (Pt able to state 2/3 hip precautions) Vision/Perception  Vision - History Baseline Vision: Wears glasses all the time Patient Visual Report: No change from baseline Cognition Cognition Arousal/Alertness: Awake/alert Overall Cognitive Status: Appears within functional limits for tasks assessed Orientation Level: Oriented X4 Sensation/Coordination Sensation Light Touch: Appears Intact Extremity Assessment RUE Assessment RUE Assessment: Exceptions to [facility] RUE AROM (degrees) Right Shoulder Flexion  0-170: 140 Degrees (pt states history of rotator cuff issues) LUE Assessment LUE Assessment: Within Functional Limits Mobility  Bed Mobility Bed Mobility: Yes Supine to Sit: 4: Min assist;With rails;HOB elevated (Comment degrees);Other (comment) (30 ) Supine to Sit Details (indicate cue type and reason): cues for technique and THPs.  Sitting - Scoot to Edge of Bed: 4: Min assist Transfers Sit to Stand: 3: Mod assist;From bed;With upper extremity assist Sit to Stand Details (indicate cue type and reason): cues for THPs and use of upper extremities. Exercises   End of Session OT - End of Session Equipment Utilized During Treatment: Gait belt;Other (comment) (rolling walker) Activity Tolerance: Patient limited by fatigue Patient left: in chair;with call bell in reach General Behavior During Session: [facility] for tasks performed Cognition: [facility] for tasks performed   Lennox Laity [phone] 11/01/2011, 11:09 AM

## 2011-11-01 NOTE — Clinical Documentation Improvement (Signed)
CHANGE MENTAL STATUS DOCUMENTATION CLARIFICATION   THIS DOCUMENT IS NOT A PERMANENT PART OF THE MEDICAL RECORD  TO RESPOND TO THE THIS QUERY, FOLLOW THE INSTRUCTIONS BELOW:  1. If needed, update documentation for the patient's encounter via the notes activity.  2. Access this query again and click edit on the [employer].  3. After updating, or not, click F2 to complete all highlighted (required) fields concerning your review. Select "additional documentation in the medical record" OR "no additional documentation provided".  4. Click Sign note button.  5. The deficiency will fall out of your InBasket *Please let us know if you are not able to compete this workflow by phone or e-mail (listed below).         11/01/11  Dear Dr. Lequita Halt, [name]  In an effort to better capture your patient's severity of illness, reflect appropriate length of stay and utilization of resources, a review of the patient medical record has revealed the following indicators.      Pt admitted with Rt Femoral Neck fx.  According to PN on 10/30/11, pt with acute confusion/delirium. Please clarify whether or not confusion/delirium can be further classified as one of the diagnoses listed below and document in pn and d/c summary. Thank You!   Possible Clinical Conditions?  _______Encephalopathy (describe type if known)                       Anoxic                       Septic                       Alcoholic                        Hepatic                       Hypertensive                       Metabolic                       _______ Toxic            Drug induced   _______Other Condition__________________ _______Cannot Clinically Determine   Supporting Information: Nurse Note 10/30/11 Pt restless at times, increase confusion and agitation noted when next pain pill is due  pn 10/30/11 Acute confusion: This appears to more of a delirium perhaps due to the medications he received for pain or  perhaps due to the anesthesia.  pn 11/01/11 Confusion - Resolved   Risk Factors: Pt has R THA  On 10/29/11  Signs & Symptoms: Confusion noted post op   Treatment: monitoring   Based on your clinical judgment, please clarify and document in a progress note and/or discharge summary the clinical condition associated with the following supporting information:  In responding to this query please exercise your independent judgment.  The fact that a query is asked, does not imply that any particular answer is desired or expected.   Reviewed:  no additional documentation provided. Pt had a medical consultant who was treating him for these issues. They should have provided documentation for this.  Thank You,  [name]  RN, BSN, CCDS Clinical Documentation Specialist [facility] Dept Pager: [phone] / E-mail: [email or web address]    Health  Information Management

## 2011-11-01 NOTE — Progress Notes (Signed)
Subjective: 3 Days Post-Op Procedure(s) (LRB): TOTAL HIP ARTHROPLASTY (Right)   Patient reports pain as mild. No events  Objective:   VITALS:   Filed Vitals:   11/01/11 0608  BP: 134/56  Pulse: 67  Temp: 98.8 F (37.1 C)  Resp: 16    Neurovascular intact Dorsiflexion/Plantar flexion intact Incision: dressing C/D/I No cellulitis present Compartment soft  LABS No new labs   Assessment/Plan: 3 Days Post-Op Procedure(s) (LRB): TOTAL HIP ARTHROPLASTY (Right)   Up with therapy Plan for discharge tomorrow to SNF if available   [name]. [name]   PAC  11/01/2011, 7:35 AM

## 2011-11-01 NOTE — Progress Notes (Signed)
Pt has ST SNF bed at [facility] 11/02/11 if stable for D/C. CSW will follow to assist with SNF placement.

## 2011-11-01 NOTE — Progress Notes (Signed)
Subjective: Patient feels much better today. No more confusion.  He is looking forward to Rehab and returning home after. He did well with Surgery and therapy. Pain is at minimal. He notes no chest pain or shortness of breath or confusion.  He is reminded to use Incentive Spirometry.  Objective: Vital signs in last 24 hours: Temp:  [98.2 F (36.8 C)-99.7 F (37.6 C)] 98.8 F (37.1 C) (12/10 0608) Pulse Rate:  [67-81] 67  (12/10 0608) Resp:  [16-20] 16  (12/10 0608) BP: (120-147)/(56-64) 134/56 mmHg (12/10 0608) SpO2:  [91 %-96 %] 92 % (12/10 6213)  Intake/Output from previous day: 12/09 0701 - 12/10 0700 In: 840 [P.O.:840] Out: 1500 [Urine:1500] Intake/Output this shift:    General: alert- no distress.   Oral membranes are moist.  Lungs are clear without wheezes rales or rhonchi.  Heart is regular with occasional skips - Sternotomy scar  Abdomen soft nontender.   Extremities - No edema.    Lab Results   Basename 10/31/11 0443 10/30/11 1400  WBC 7.2 6.8  RBC 2.36* 2.57*  HGB 8.0* 8.6*  HCT 22.8* 24.8*  MCV 96.6 96.5  MCH 33.9 33.5  RDW 13.2 13.1  PLT 197 226    Basename 10/31/11 0443 10/30/11 1400  NA 137 135  K 4.3 4.3  CL 105 102  CO2 27 27  GLUCOSE 255* 332*  BUN 31* 31*  CREATININE 1.22 1.20  CALCIUM 8.5 8.6    Scheduled Meds:    . amLODipine  2.5 mg Oral Daily  . antiseptic oral rinse  15 mL Mouth Rinse q12n4p  . atenolol  50 mg Oral Daily  . docusate sodium  100 mg Oral BID  . esomeprazole  40 mg Oral Q1200  . fenofibrate  160 mg Oral Daily  . ferrous sulfate  325 mg Oral TID PC  . gabapentin  300 mg Oral TID  . insulin aspart  0-15 Units Subcutaneous TID WC  . insulin aspart  0-5 Units Subcutaneous QHS  . insulin glargine  18 Units Subcutaneous QHS  . ketorolac  7.5 mg Intravenous Q6H  . lisinopril  20 mg Oral Daily  . polyethylene glycol  17 g Oral BID  . ranolazine  500 mg Oral BID  . rivaroxaban  10 mg Oral Q24H  . rosuvastatin  10 mg  Oral q1800  . traMADol  50-100 mg Oral Q6H  . DISCONTD: insulin glargine  15 Units Subcutaneous QHS   Continuous Infusions:    . DISCONTD: sodium chloride 0.9 % 1,000 mL with potassium chloride 10 mEq infusion 100 mL/hr (10/31/11 0228)   PRN Meds:acetaminophen, acetaminophen, alum & mag hydroxide-simeth, bisacodyl, diphenhydrAMINE, menthol-cetylpyridinium, methocarbamol(ROBAXIN) IV, methocarbamol, metoCLOPramide (REGLAN) injection, metoCLOPramide, ondansetron (ZOFRAN) IV, ondansetron, oxyCODONE-acetaminophen, phenol, zolpidem  Asessment/Plan:s  Patient Active Problem List   Diagnoses   .  CAD (coronary artery disease) this appears to be clinically stable.   .  Fracture of femoral neck, right: He is status post repair and doing well with therapy.   [name]  HTN (hypertension): Blood pressure is reasonable   .  Diabetes mellitus with neurological manifestation : Blood sugars are improving. On 225 this morning. I will add a bit more Lantus. CBG (last 3)   Basename 11/01/11 0711 10/31/11 2141 10/31/11 1716  GLUCAP 265* 312* 229*    Anemia: This is blood loss related to surgery. We need to recheck and if 8.0 or lower - he may need a transfusion in light of his heart disease. No  labs were ordered so I just put in the order now. :   Disposition - SNF Soon DVT Prophylaxis Confusion - Resolved      LOS: 4 days   [name] 11/01/2011, 7:26 AM

## 2011-11-02 LAB — CBC
MCH: 32.6 pg (ref 26.0–34.0)
MCHC: 34.5 g/dL (ref 30.0–36.0)
RDW: 14.6 % (ref 11.5–15.5)

## 2011-11-02 LAB — TYPE AND SCREEN
Antibody Screen: NEGATIVE
Unit division: 0

## 2011-11-02 LAB — GLUCOSE, CAPILLARY: Glucose-Capillary: 223 mg/dL — ABNORMAL HIGH (ref 70–99)

## 2011-11-02 MED ORDER — ACETAMINOPHEN 325 MG PO TABS: 650.0000 mg | ORAL_TABLET | Freq: Four times a day (QID) | ORAL | Status: AC | PRN

## 2011-11-02 MED ORDER — BISACODYL 10 MG RE SUPP: 10.0000 mg | Freq: Every day | RECTAL | Status: DC | PRN

## 2011-11-02 MED ORDER — MINERAL OIL RE ENEM
1.0000 | ENEMA | Freq: Once | RECTAL | Status: DC | PRN
  Filled 2011-11-02: qty 1

## 2011-11-02 MED ORDER — FERROUS SULFATE 325 (65 FE) MG PO TABS: 325.0000 mg | ORAL_TABLET | Freq: Three times a day (TID) | ORAL | Status: DC

## 2011-11-02 MED ORDER — METHOCARBAMOL 500 MG PO TABS: 500.0000 mg | ORAL_TABLET | Freq: Four times a day (QID) | ORAL | Status: AC | PRN

## 2011-11-02 MED ORDER — MINERAL OIL RE ENEM
1.0000 | ENEMA | Freq: Once | RECTAL | Status: DC
  Filled 2011-11-02: qty 1

## 2011-11-02 MED ORDER — POLYETHYLENE GLYCOL 3350 17 G PO PACK: 17.0000 g | PACK | Freq: Two times a day (BID) | ORAL | Status: AC

## 2011-11-02 MED ORDER — DIPHENHYDRAMINE HCL 25 MG PO CAPS: 25.0000 mg | ORAL_CAPSULE | Freq: Four times a day (QID) | ORAL | Status: AC | PRN

## 2011-11-02 MED ORDER — INSULIN GLARGINE 100 UNIT/ML ~~LOC~~ SOLN: 42.0000 [IU] | Freq: Every day | SUBCUTANEOUS | Status: DC

## 2011-11-02 MED ORDER — DSS 100 MG PO CAPS: 100.0000 mg | ORAL_CAPSULE | Freq: Two times a day (BID) | ORAL | Status: AC

## 2011-11-02 MED ORDER — TRAMADOL HCL 50 MG PO TABS: 50.0000 mg | ORAL_TABLET | Freq: Four times a day (QID) | ORAL | Status: AC

## 2011-11-02 NOTE — Progress Notes (Signed)
Physical Therapy Treatment Patient Details Name: [name] MRN: [id number] DOB: 28-Feb-1928 Today's Date: 12/11/2012Late entry for 11/01/11 at 1400-1430 PT Assessment/Plan  PT - Assessment/Plan Comments on Treatment Session: pt  MS clear, tolerated avctivity but fatigued easily PT Plan: Discharge plan remains appropriate;Frequency remains appropriate PT Frequency: 7X/week Follow Up Recommendations: Skilled nursing facility Equipment Recommended: Defer to next venue PT Goals  Acute Rehab PT Goals PT Goal Formulation: With patient Time For Goal Achievement: 7 days Pt will go Supine/Side to Sit: with min assist;with HOB 0 degrees PT Goal: Supine/Side to Sit - Progress: Progressing toward goal Pt will go Sit to Supine/Side: with min assist;with HOB 0 degrees PT Goal: Sit to Supine/Side - Progress: Progressing toward goal Pt will go Sit to Stand: with supervision PT Goal: Sit to Stand - Progress: Progressing toward goal Pt will go Stand to Sit: with supervision PT Goal: Stand to Sit - Progress: Progressing toward goal Pt will Ambulate: 51 - 150 feet;with rolling walker PT Goal: Ambulate - Progress: Progressing toward goal Pt will Perform Home Exercise Program: with supervision, verbal cues required/provided PT Goal: Perform Home Exercise Program - Progress: Progressing toward goal  PT Treatment Precautions/Restrictions  Precautions Precautions: Posterior Hip Precaution Comments: sign in room Restrictions Weight Bearing Restrictions: No RLE Weight Bearing: Weight bearing as tolerated Mobility (including Balance) Transfers Sit to Stand: 3: Mod assist;From chair/3-in-1;With upper extremity assist Sit to Stand Details (indicate cue type and reason): cues for RLE precautions Stand to Sit: 4: Min assist;To chair/3-in-1;With upper extremity assist;With armrests Stand to Sit Details: cues for RLE position Ambulation/Gait Ambulation/Gait: Yes Ambulation/Gait Assistance: 4: Min  assist Ambulation/Gait Assistance Details (indicate cue type and reason): vc for posture, sequence Ambulation Distance (Feet): 40 Feet Assistive device: Rolling walker Gait Pattern: Step-to pattern;Antalgic    Exercise  Total Joint Exercises Quad Sets: AROM;Right;10 reps;Seated Heel Slides: AAROM;Right;10 reps;Supine Hip ABduction/ADduction: AAROM;Right;10 reps;Supine End of Session PT - End of Session Activity Tolerance: Patient limited by fatigue (pt to get 1 unit of blood) Patient left: in chair;with call bell in reach General Behavior During Session: [facility] for tasks performed Cognition: [facility] for tasks performed  Rada Hay 11/01/2011,1400

## 2011-11-02 NOTE — Discharge Summary (Signed)
Physician Discharge Summary  Patient ID: [name] MRN: [id number] DOB/AGE: 1928/04/29 77 y.o.  Admit date: 10/28/2011 Discharge date: 11/02/2011  Procedures:  Procedure(s) (LRB): TOTAL HIP ARTHROPLASTY (Right)  Attending Physician: Shelda Pal   Admission Diagnoses: Right hip fracture  Discharge Diagnoses:  Principal Problem:  *Fracture of femoral neck, right Active Problems:  HTN (hypertension)  Diabetes mellitus with neurological manifestation  Confusion   HPI: Patient is a 77 y.o. male who has been seen by Dr. Lequita Halt in ED for ongoing hip pain. They have been found to have continued progressive pain. X-rays show that the patient has femoral neck fracture on the right with very slight varus tilt otherwise nondisplaced. It is felt that they would benefit from undergoing surgical intervention. Risks and benefits have been discussed with the patient and they elect to proceed with surgery. The patient has no contraindications to the upcoming procedure such as ongoing infection or progressive neurological disease. The patient's PCP is Dr. Timothy Lasso. Their group will be consulter to assist with medical management of the patient.  Patient and wife states that he has had pain for 8 days. It has gradually increased. He and his wife had been up to [place] to help move family belongings. They have made 8 or 9 trips since July. On the most recent trip about 8 days ago, it was noticed that the patient could not help load the truck. 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 About three days, the pain had essentially put him at bedrest. He had quite severe pain last night and unable to sleep prompting him to come in and be evaluated. X-rays were found to show the hip fracture.   PCP: Gwen Pounds, MD, MD   Discharged Condition: good  Hospital Course:  Patient underwent the above stated procedure on 10/29/2011. Patient tolerated the procedure well  and brought to the recovery room in good condition and subsequently to the floor.  POD #1 AF, VSS Pt's foley was removed, as well as the hemovac drain removed. Pt was having some confusion from the medication and this was changed.  Labs  Hgb      11.4  POD #2  AF, VSS Patient denies any complaints other than the sore heels. Denies any shortness of breath chest pain or abdominal discomfort.  Patient is conscious alert appropriate sitting up watching TV he is completely eaten his meal from his plate and this completed his coffee. His right hip dressing is intact no signs of erythema or drainage around the dressing leave the dressing intact his thigh and calf are soft and nontender his heels are all off the bed elevated by a blanket rolls his head SCDs in place he is neurologically intact in the lower extremities.  LABS Basename 10/31/11 0443     HGB  8.0*      POD #3  BP: 134/56 ; Pulse: 67 ; Temp: 98.8 F Patient reports pain as mild. No events. Neurovascular intact, dorsiflexion/plantar flexion intact, incision: dressing C/D/I, no cellulitis present and compartment soft.  LABS  Basename  11/01/11 0815     HGB  8.0*     HCT  23.2*       POD #4 BP: 130/61 ; Pulse: 64 ; Temp: 98.6 F Patient reports pain as mild. No events. Felt to be doing well enough to be discharged to SNF. Neurovascular intact, dorsiflexion/plantar flexion intact, incision: dressing C/D/I, no cellulitis present and compartment soft.  LABS  Basename  11/02/11 0357     HGB  9.0*     HCT  26.1*       Discharge Exam: Extremities: Homans sign is negative, no sign of DVT, no edema, redness or tenderness in the calves or thighs and no ulcers, gangrene or trophic changes  Disposition: SNF with follow up in 2 weeks at [facility]  Discharge Orders    Future Orders Please Complete By Expires   Diet - low sodium heart healthy      Call MD / Call 911      Comments:   If you experience chest pain  or shortness of breath, CALL 911 and be transported to the hospital emergency room.  If you develope a fever above 101 F, pus (white drainage) or increased drainage or redness at the wound, or calf pain, call your surgeon's office.   Discharge instructions      Comments:   Maintain surgical dressing for 8 days, then replace with gauze and tape. Keep the area dry and clean until follow up. Follow up in 2 weeks at [facility]. Call with any questions or concerns.     Constipation Prevention      Comments:   Drink plenty of fluids.  Prune juice may be helpful.  You may use a stool softener, such as Colace (over the counter) 100 mg twice a day.  Use MiraLax (over the counter) for constipation as needed.   Increase activity slowly as tolerated      Weight Bearing as taught in Physical Therapy      Comments:   Use a walker or crutches as instructed.   Driving restrictions      Comments:   No driving for 4 weeks   Change dressing      Comments:   Maintain surgical dressing for 8 days, the replace with 4x4 guaze and tape. Keep area dry and clean.   TED hose      Comments:   Use stockings (TED hose) for 2 weeks on both leg(s).  You may remove them at night for sleeping.     Current Discharge Medication List    START taking these medications   Details  acetaminophen (TYLENOL) 325 MG tablet Take 2 tablets (650 mg total) by mouth every 6 (six) hours as needed (or Fever >/= 101). Qty: 30 tablet    diphenhydrAMINE (BENADRYL) 25 mg capsule Take 1 capsule (25 mg total) by mouth every 6 (six) hours as needed for itching, allergies or sleep.    docusate sodium 100 MG CAPS Take 100 mg by mouth 2 (two) times daily.    ferrous sulfate 325 (65 FE) MG tablet Take 1 tablet (325 mg total) by mouth 3 (three) times daily after meals.    methocarbamol (ROBAXIN) 500 MG tablet Take 1 tablet (500 mg total) by mouth every 6 (six) hours as needed. Qty: 50 tablet, Refills: 0    polyethylene glycol  (MIRALAX / GLYCOLAX) packet Take 17 g by mouth 2 (two) times daily. Qty: 14 each      CONTINUE these medications which have CHANGED   Details  traMADol (ULTRAM) 50 MG tablet Take 1-2 tablets (50-100 mg total) by mouth every 6 (six) hours. Maximum dose= 8 tablets per day Qty: 100 tablet, Refills: 0      CONTINUE these medications which have NOT CHANGED   Details  amLODipine (NORVASC) 2.5 MG tablet Take 2.5 mg by mouth daily.  atenolol (TENORMIN) 50 MG tablet Take 50 mg by mouth daily.      esomeprazole (NEXIUM) 40 MG capsule Take 40 mg by mouth daily before breakfast.      fenofibrate 160 MG tablet Take 160 mg by mouth daily.      gabapentin (NEURONTIN) 300 MG capsule Take 300 mg by mouth 3 (three) times daily.      lisinopril (PRINIVIL,ZESTRIL) 40 MG tablet Take 20 mg by mouth daily.     ranolazine (RANEXA) 500 MG 12 hr tablet Take 500 mg by mouth 2 (two) times daily.      simvastatin (ZOCOR) 40 MG tablet Take 40 mg by mouth at bedtime.        STOP taking these medications     Cholecalciferol (VITAMIN D PO) Comments:  Reason for Stopping:       insulin glargine (LANTUS) 100 UNIT/ML injection Comments:  Reason for Stopping:       insulin lispro (HUMALOG) 100 UNIT/ML injection Comments:  Reason for Stopping:       Multiple Vitamin (MULTIVITAMIN PO) Comments:  Reason for Stopping:         Follow-up Information    Follow up with Gwen Pounds, MD .         Signed: Anastasio Auerbach. Mialani Reicks   PAC  11/02/2011, 8:19 AM

## 2011-11-02 NOTE — Progress Notes (Signed)
Pt plans to D/C to [facility] today for ST SNF placement via [facility] transport. CSW will assist with D/C arrangements.

## 2011-11-02 NOTE — Progress Notes (Signed)
Subjective: 4 Days Post-Op Procedure(s) (LRB): TOTAL HIP ARTHROPLASTY (Right)   Patient reports pain as mild. No events.  Objective:   VITALS:   Filed Vitals:   11/02/11 0405  BP: 130/61  Pulse: 64  Temp: 98.6 F (37 C)  Resp: 16    Neurovascular intact Dorsiflexion/Plantar flexion intact Incision: dressing C/D/I No cellulitis present Compartment soft  LABS  Basename 11/02/11 0357 11/01/11 0815 10/31/11 0443  HGB 9.0* 8.0* 8.0*  HCT 26.1* 23.2* 22.8*  WBC 8.2 7.2 7.2  PLT 249 220 197     Basename 11/01/11 0815 10/31/11 0443 10/30/11 1400  NA 135 137 135  K 4.5 4.3 4.3  BUN 32* 31* 31*  CREATININE 1.15 1.22 1.20  GLUCOSE 251* 255* 332*    Assessment/Plan: 4 Days Post-Op Procedure(s) (LRB): TOTAL HIP ARTHROPLASTY (Right)   Up with therapy Discharge to SNF today.   Anastasio Auerbach Maclain Cohron   PAC  11/02/2011, 8:04 AM

## 2011-11-02 NOTE — Progress Notes (Signed)
Subjective: Patient feels well.  No more confusion. Pain is at minimal. No chest pain or shortness of breath or confusion.  He is reminded to use Incentive Spirometry.  No BM  Objective: Vital signs in last 24 hours: Temp:  [97.7 F (36.5 C)-98.6 F (37 C)] 98.6 F (37 C) (12/11 0405) Pulse Rate:  [58-74] 64  (12/11 0405) Resp:  [16] 16  (12/11 0405) BP: (112-179)/(47-75) 130/61 mmHg (12/11 0405) SpO2:  [90 %-93 %] 90 % (12/11 0405)  Intake/Output from previous day: 12/10 0701 - 12/11 0700 In: 1100 [P.O.:720; I.V.:100; Blood:280] Out: 1550 [Urine:1550] Intake/Output this shift:    General: alert- no distress. A and O x 3  Oral membranes are moist.  Lungs are clear without wheezes rales or rhonchi.  Heart is regular with occasional skips - Sternotomy scar  Abdomen soft nontender.   Extremities - No edema.    Lab Results   [facility] 11/02/11 0357 11/01/11 0815  WBC 8.2 7.2  RBC 2.76* 2.39*  HGB 9.0* 8.0*  HCT 26.1* 23.2*  MCV 94.6 97.1  MCH 32.6 33.5  RDW 14.6 13.5  PLT 249 220    Basename 11/01/11 0815 10/31/11 0443  NA 135 137  K 4.5 4.3  CL 103 105  CO2 28 27  GLUCOSE 251* 255*  BUN 32* 31*  CREATININE 1.15 1.22  CALCIUM 9.0 8.5    Scheduled Meds:    . acetaminophen  650 mg Oral Once  . amLODipine  2.5 mg Oral Daily  . antiseptic oral rinse  15 mL Mouth Rinse q12n4p  . atenolol  50 mg Oral Daily  . docusate sodium  100 mg Oral BID  . esomeprazole  40 mg Oral Q1200  . fenofibrate  160 mg Oral Daily  . ferrous sulfate  325 mg Oral TID PC  . furosemide  20 mg Intravenous Once  . gabapentin  300 mg Oral TID  . insulin aspart  0-15 Units Subcutaneous TID WC  . insulin aspart  0-5 Units Subcutaneous QHS  . insulin glargine  35 Units Subcutaneous QHS  . insulin NPH  12 Units Subcutaneous Once  . lisinopril  20 mg Oral Daily  . polyethylene glycol  17 g Oral BID  . ranolazine  500 mg Oral BID  . rivaroxaban  10 mg Oral Q24H  . rosuvastatin  10 mg Oral  q1800  . traMADol  50-100 mg Oral Q6H  . DISCONTD: insulin glargine  18 Units Subcutaneous QHS   Continuous Infusions:   PRN Meds:acetaminophen, acetaminophen, alum & mag hydroxide-simeth, bisacodyl, diphenhydrAMINE, menthol-cetylpyridinium, methocarbamol(ROBAXIN) IV, methocarbamol, metoCLOPramide (REGLAN) injection, metoCLOPramide, ondansetron (ZOFRAN) IV, ondansetron, oxyCODONE-acetaminophen, phenol, zolpidem  Asessment/Plan:s  Patient Active Problem List   Diagnoses   .  CAD (coronary artery disease) - stable.   .  Fracture of femoral neck, right: He is status post repair and doing well with therapy. Heading to Rehab today  .  HTN (hypertension): Blood pressure is reasonable   .  Diabetes mellitus with neurological manifestation : Blood sugars are improving.  D/c on close to home Insulin dosing.  He received NPH and Increased lantus yesterday. CBG (last 3)   Basename 11/02/11 0701 11/01/11 2144 11/01/11 1814  GLUCAP 193* 254* 183*    Anemia: This is blood loss related to surgery. S/P TXfusion and did well.  Hbg 9 today. :   Disposition - SNF today DVT Prophylaxis Confusion - Resolved Constipation - Push Bowel Prep.      LOS: 5 days  Lenox Bink M 11/02/2011, 7:17 AM

## 2011-11-02 NOTE — Progress Notes (Signed)
Assessment complete, see for details. Patient resting quietly in bed. No acute distress noted. No needs, concerns.

## 2012-02-08 ENCOUNTER — Ambulatory Visit (INDEPENDENT_AMBULATORY_CARE_PROVIDER_SITE_OTHER): Payer: Medicare Other | Admitting: Cardiovascular Disease

## 2012-02-08 ENCOUNTER — Encounter: Payer: Self-pay | Admitting: Cardiovascular Disease

## 2012-02-08 VITALS — BP 117/56 | HR 63 | Ht 69.0 in | Wt 198.8 lb

## 2012-02-08 DIAGNOSIS — I10 Essential (primary) hypertension: Secondary | ICD-10-CM

## 2012-02-08 DIAGNOSIS — I2510 Atherosclerotic heart disease of native coronary artery without angina pectoris: Secondary | ICD-10-CM

## 2012-02-08 NOTE — Assessment & Plan Note (Addendum)
Mr. Eakins  is doing very well. His blood pressure is fairly well controlled.  Continue with the same medications.

## 2012-02-08 NOTE — Patient Instructions (Signed)
Your physician wants you to follow-up in: 6 MONTHS  You will receive a reminder letter in the mail two months in advance. If you don't receive a letter, please call our office to schedule the follow-up appointment.  PLEASE BRING COPY OF CHOLESTEROL OR WE CAN DRAW ON NEXT VIST

## 2012-02-08 NOTE — Assessment & Plan Note (Signed)
[name] is doing very well. He's not having episodes of angina. He does not like the cost of the Ranexa.   We'll try discontinuing the Ranexa and seeing how he does.

## 2012-02-08 NOTE — Progress Notes (Signed)
[name] Date of Birth  [birth year] [facility] [address]     [address] [place], [place]  [address] [phone]  Fax  [phone]  Problem List 1. CAD - s/p CABG 2. Peripheral neuropathy 3. HTN 4. Hyperlipidemia 5. Diabetes Mellitus  History of Present Illness:  78 year old gentleman with a history of coronary artery disease and coronary artery bypass grafting. He has a history of hypertension.  He's not had any significant episodes of chest pain or shortness of breath. He does get short of breath he tries to walk too quickly or walks without his walker.  Ranexa is fairly expensive and he asked if it was absolutely necessary.    He walks with the assistance of a walker because of some low back pain. He's able to get around in his house without a walker fairly well. He had right hip replacement this past year. He's not had any significant pain.    Current Outpatient Prescriptions  Medication Sig Dispense Refill  . amLODipine (NORVASC) 2.5 MG tablet Take 2.5 mg by mouth daily.        Marland Kitchen aspirin 325 MG tablet Take 325 mg by mouth daily.      Marland Kitchen atenolol (TENORMIN) 50 MG tablet Take 50 mg by mouth daily.        Marland Kitchen DICLOFENAC SODIUM PO Take 75 mg by mouth 2 (two) times daily.      Marland Kitchen esomeprazole (NEXIUM) 40 MG capsule Take 40 mg by mouth daily before breakfast.        . fenofibrate 160 MG tablet Take 160 mg by mouth daily.        Marland Kitchen gabapentin (NEURONTIN) 300 MG capsule Take 300 mg by mouth. Taking Two Capsule TID      . Insulin Glargine (LANTUS [place]) Inject 40 Units into the skin daily.      . Insulin Lispro, Human, (HUMALOG PEN [place]) Inject into the skin. Slide Scale      . lisinopril (PRINIVIL,ZESTRIL) 40 MG tablet Take 20 mg by mouth daily.       . ranolazine (RANEXA) 500 MG 12 hr tablet Take 500 mg by mouth 2 (two) times daily.        . simvastatin (ZOCOR) 40 MG tablet Take 40 mg by mouth at bedtime.        . traMADol (ULTRAM) 50 MG  tablet Take 50 mg by mouth 2 (two) times daily.         Allergies  Allergen Reactions  . Tetracyclines & Related     Past Medical History  Diagnosis Date  . Hypertension   . Diabetes mellitus   . Dyslipidemia   . Coronary artery disease     status post CABG. He is also status post PTCA and stenting of the circumflex artery  . Heart murmur   . Renal calculi   . Peripheral autonomic neuropathy due to diabetes mellitus     Past Surgical History  Procedure Date  . Lithotripsy   . US echocardiography 01-16-09    EF 55-60  . Cardiovascular stress test 09-26-03    EF 43%  . Cardiac catheterization   . Coronary artery bypass graft     CABG X 4  . Coronary angioplasty with stent placement   . Tonsillectomy   . Cataract extraction w/ intraocular lens implant     right  . Back surgery     x6  . Total hip arthroplasty 10/29/2011  Procedure: TOTAL HIP ARTHROPLASTY;  Surgeon: Shelda Pal;  Location: [facility];  Service: Orthopedics;  Laterality: Right;    History  Smoking status  . Former Smoker  . Types: Pipe  Smokeless tobacco  . Never Used    History  Alcohol Use No    No family history on file.  Reviw of Systems:  Reviewed in the HPI.  All other systems are negative.  Physical Exam: BP 117/56  Pulse 63  Ht 5\' 9"  (1.753 m)  Wt 198 lb 12.8 oz (90.175 kg)  BMI 29.36 kg/m2 The patient is alert and oriented x 3.  The mood and affect are normal.   Skin: warm and dry.  Color is normal.    HEENT:   the sclera are nonicteric.  The mucous membranes are moist.  The carotids are 2+ without bruits.  There is no thyromegaly.  There is no JVD.    Lungs: clear.  The chest wall is non tender.    Heart: regular rate with a normal S1 and S2.  There is a soft systolic murmur. The PMI is not displaced.     Abdomen: good bowel sounds.  There is no guarding or rebound.  There is no hepatosplenomegaly or tenderness.  There are no masses.   Extremities:  no clubbing, cyanosis,  or edema.  The legs are without rashes.  The distal pulses are intact.   Neuro:  Cranial nerves II - XII are intact.  Motor and sensory functions are intact.    He walks with the assistance of a walker.  ECG:  Assessment / Plan:

## 2012-02-21 ENCOUNTER — Other Ambulatory Visit: Payer: Self-pay | Admitting: *Deleted

## 2012-02-21 MED ORDER — AMLODIPINE BESYLATE 2.5 MG PO TABS: 2.5000 mg | ORAL_TABLET | Freq: Every day | ORAL | Status: DC

## 2012-02-21 NOTE — Telephone Encounter (Signed)
Fax Received. Refill Completed. Camary Sosa Chowoe (R.M.A)   

## 2012-03-24 ENCOUNTER — Other Ambulatory Visit: Payer: Self-pay | Admitting: *Deleted

## 2012-03-24 MED ORDER — RANOLAZINE ER 500 MG PO TB12: 500.0000 mg | ORAL_TABLET | Freq: Two times a day (BID) | ORAL | Status: DC

## 2012-03-24 NOTE — Telephone Encounter (Signed)
Fax Received. Refill Completed. [name] (R.M.A)   

## 2012-03-27 ENCOUNTER — Telehealth: Payer: Self-pay | Admitting: *Deleted

## 2012-03-27 MED ORDER — SIMVASTATIN 20 MG PO TABS: 20.0000 mg | ORAL_TABLET | Freq: Every day | ORAL | Status: DC

## 2012-03-27 NOTE — Telephone Encounter (Signed)
Simvastatin reduced pt informed and agreed to plan, stated he was to see his pcp this week and will ask if another med would be better.

## 2012-03-27 NOTE — Telephone Encounter (Signed)
Message copied by Antony Odea on Mon Mar 27, 2012  1:27 PM ------      Message from: Andrey Cota A      Created: Mon Mar 27, 2012  9:03 AM                   ----- Message -----         From: Vesta Mixer, MD         Sent: 03/26/2012   5:15 PM           To: Alona Bene, MA            Thanks for noticing.  Have him cut his simvatatin to 20 mg until I see him again.  I would prefer that he change to atorvastatin but we can talk about that at ov.            ----- Message -----         From: Alona Bene, MA         Sent: 03/24/2012  10:47 AM           To: Vesta Mixer, MD, Antony Odea, RN            Just filled Patients Renexa 500mg  BID 03-24-12 and noiced pt is on Simvastatin 40mg  and Norvasc 2.5mg .

## 2012-07-12 ENCOUNTER — Other Ambulatory Visit: Payer: Self-pay | Admitting: *Deleted

## 2012-07-12 MED ORDER — ATENOLOL 50 MG PO TABS: 50.0000 mg | ORAL_TABLET | Freq: Every day | ORAL | Status: DC

## 2012-09-01 ENCOUNTER — Other Ambulatory Visit: Payer: Self-pay | Admitting: *Deleted

## 2012-09-01 MED ORDER — AMLODIPINE BESYLATE 2.5 MG PO TABS: 2.5000 mg | ORAL_TABLET | Freq: Every day | ORAL | Status: DC

## 2012-09-01 NOTE — Telephone Encounter (Signed)
Fax Received. Refill Completed. Corinne Goucher Chowoe (R.M.A)   

## 2012-11-10 ENCOUNTER — Other Ambulatory Visit: Payer: Self-pay | Admitting: Cardiovascular Disease

## 2012-11-10 MED ORDER — SIMVASTATIN 20 MG PO TABS: 20.0000 mg | ORAL_TABLET | Freq: Every day | ORAL | Status: DC

## 2013-03-08 ENCOUNTER — Other Ambulatory Visit: Payer: Self-pay | Admitting: *Deleted

## 2013-03-08 MED ORDER — RANOLAZINE ER 500 MG PO TB12: 500.0000 mg | ORAL_TABLET | Freq: Two times a day (BID) | ORAL | Status: DC

## 2013-03-08 NOTE — Telephone Encounter (Signed)
NO REFILLS UNTIL APPOINTMENT

## 2013-03-20 ENCOUNTER — Other Ambulatory Visit: Payer: Self-pay | Admitting: *Deleted

## 2013-03-20 MED ORDER — AMLODIPINE BESYLATE 2.5 MG PO TABS: 2.5000 mg | ORAL_TABLET | Freq: Every day | ORAL | Status: DC

## 2013-03-20 NOTE — Telephone Encounter (Signed)
Fax Received. Refill Completed. Raeden Schippers Chowoe (R.M.A)   

## 2013-05-14 ENCOUNTER — Other Ambulatory Visit: Payer: Self-pay | Admitting: Cardiovascular Disease

## 2013-05-15 ENCOUNTER — Encounter: Payer: Self-pay | Admitting: Cardiovascular Disease

## 2013-05-15 NOTE — Telephone Encounter (Signed)
No refills until appointment Fax Received. Refill Completed. [name] (R.M.A)   

## 2013-05-16 ENCOUNTER — Encounter: Payer: Self-pay | Admitting: Cardiovascular Disease

## 2013-05-21 ENCOUNTER — Ambulatory Visit: Payer: Medicare Other | Admitting: Cardiovascular Disease

## 2013-06-11 ENCOUNTER — Other Ambulatory Visit: Payer: Self-pay | Admitting: Cardiovascular Disease

## 2013-07-03 ENCOUNTER — Other Ambulatory Visit: Payer: Self-pay | Admitting: Cardiovascular Disease

## 2013-07-13 ENCOUNTER — Other Ambulatory Visit: Payer: Self-pay | Admitting: Cardiovascular Disease

## 2013-07-16 NOTE — Telephone Encounter (Signed)
Pt is aware and understand he has to come to his appointment to get more refills. Fax Received. Refill Completed. Kylee Umana Chowoe (R.M.A)

## 2013-08-11 ENCOUNTER — Other Ambulatory Visit: Payer: Self-pay | Admitting: Cardiovascular Disease

## 2013-08-14 ENCOUNTER — Other Ambulatory Visit: Payer: Self-pay

## 2013-08-14 MED ORDER — AMLODIPINE BESYLATE 2.5 MG PO TABS: ORAL_TABLET | ORAL | Status: DC

## 2013-08-18 ENCOUNTER — Other Ambulatory Visit: Payer: Self-pay | Admitting: Cardiovascular Disease

## 2013-09-18 ENCOUNTER — Encounter: Payer: Self-pay | Admitting: Cardiovascular Disease

## 2013-09-18 ENCOUNTER — Other Ambulatory Visit: Payer: Self-pay | Admitting: Cardiovascular Disease

## 2013-09-18 ENCOUNTER — Other Ambulatory Visit: Payer: Self-pay | Admitting: Cardiology

## 2013-09-27 ENCOUNTER — Encounter (INDEPENDENT_AMBULATORY_CARE_PROVIDER_SITE_OTHER): Payer: Self-pay

## 2013-09-27 ENCOUNTER — Encounter: Payer: Self-pay | Admitting: Cardiovascular Disease

## 2013-09-27 ENCOUNTER — Ambulatory Visit (INDEPENDENT_AMBULATORY_CARE_PROVIDER_SITE_OTHER): Payer: Medicare Other | Admitting: Cardiovascular Disease

## 2013-09-27 VITALS — BP 145/65 | HR 59 | Ht 69.0 in | Wt 195.0 lb

## 2013-09-27 DIAGNOSIS — I10 Essential (primary) hypertension: Secondary | ICD-10-CM

## 2013-09-27 DIAGNOSIS — E1142 Type 2 diabetes mellitus with diabetic polyneuropathy: Secondary | ICD-10-CM

## 2013-09-27 DIAGNOSIS — E1149 Type 2 diabetes mellitus with other diabetic neurological complication: Secondary | ICD-10-CM

## 2013-09-27 DIAGNOSIS — I2510 Atherosclerotic heart disease of native coronary artery without angina pectoris: Secondary | ICD-10-CM

## 2013-09-27 NOTE — Patient Instructions (Signed)
Your physician wants you to follow-up in: 6 months  You will receive a reminder letter in the mail two months in advance. If you don't receive a letter, please call our office to schedule the follow-up appointment.  Your physician recommends that you continue on your current medications as directed. Please refer to the Current Medication list given to you today.  

## 2013-09-27 NOTE — Assessment & Plan Note (Signed)
His labs are checked by his medical doctor.

## 2013-09-27 NOTE — Assessment & Plan Note (Signed)
Stable, no angina  

## 2013-09-27 NOTE — Progress Notes (Signed)
[name]  Date of Birth  09-Mar-1928 [facility] / [facility] [address]     [address] [place], [place]  [address] [phone]  Fax  [phone]  Problem List 1. CAD - s/p CABG 2. Peripheral neuropathy 3. HTN 4. Hyperlipidemia 5. Diabetes Mellitus  History of Present Illness:  79 year old gentleman with a history of coronary artery disease and coronary artery bypass grafting. He has a history of hypertension.  He's not had any significant episodes of chest pain or shortness of breath. He does get short of breath he tries to walk too quickly or walks without his walker.  Ranexa is fairly expensive and he asked if it was absolutely necessary.    He walks with the assistance of a walker because of some low back pain. He's able to get around in his house without a walker fairly well. He had right hip replacement this past year. He's not had any significant pain.   Nov. 6, 2014:  [name] is doing well.  Getting around with his walker ( has balance problems and back pain)  No cardiac issues.    Current Outpatient Prescriptions  Medication Sig Dispense Refill  . amLODipine (NORVASC) 2.5 MG tablet TAKE 1 TABLET (2.5 MG TOTAL) BY MOUTH DAILY.  30 tablet  0  . aspirin 325 MG tablet Take 325 mg by mouth daily.      Marland Kitchen atenolol (TENORMIN) 50 MG tablet TAKE 1 TABLET BY MOUTH DAILY.  30 tablet  0  . DICLOFENAC SODIUM PO Take 75 mg by mouth 2 (two) times daily.      Marland Kitchen esomeprazole (NEXIUM) 40 MG capsule Take 40 mg by mouth daily before breakfast.        . fenofibrate 160 MG tablet Take 160 mg by mouth daily.        Marland Kitchen gabapentin (NEURONTIN) 300 MG capsule Take 300 mg by mouth. Taking Two Capsule TID      . Insulin Glargine (LANTUS [place]) Inject 40 Units into the skin daily.      . Insulin Lispro, Human, (HUMALOG PEN [place]) Inject into the skin. Slide Scale      . lisinopril (PRINIVIL,ZESTRIL) 40 MG tablet Take 20 mg by mouth daily.       Marland Kitchen RANEXA 500  MG 12 hr tablet TAKE 1 TABLET TWICE A DAY  60 tablet  0  . simvastatin (ZOCOR) 20 MG tablet TAKE 1 TABLET BY MOUTH AT BEDTIME  30 tablet  1  . traMADol (ULTRAM) 50 MG tablet Take 50 mg by mouth 2 (two) times daily.      . [DISCONTINUED] ferrous sulfate 325 (65 FE) MG tablet Take 1 tablet (325 mg total) by mouth 3 (three) times daily after meals.       No current facility-administered medications for this visit.     Allergies  Allergen Reactions  . Tetracyclines & Related     Past Medical History  Diagnosis Date  . Hypertension   . Diabetes mellitus   . Dyslipidemia   . Coronary artery disease     status post CABG. He is also status post PTCA and stenting of the circumflex artery  . Heart murmur   . Renal calculi   . Peripheral autonomic neuropathy due to diabetes mellitus     Past Surgical History  Procedure Laterality Date  . Lithotripsy    . US echocardiography  01-16-09    EF 55-60  . Cardiovascular stress test  09-26-03    EF 43%  . Cardiac catheterization    . Coronary artery bypass graft      CABG X 4  . Coronary angioplasty with stent placement    . Tonsillectomy    . Cataract extraction w/ intraocular lens implant      right  . Back surgery      x6  . Total hip arthroplasty  10/29/2011    Procedure: TOTAL HIP ARTHROPLASTY;  Surgeon: Shelda Pal;  Location: [facility];  Service: Orthopedics;  Laterality: Right;    History  Smoking status  . Former Smoker  . Types: Pipe  Smokeless tobacco  . Never Used    History  Alcohol Use No    No family history on file.  Reviw of Systems:  Reviewed in the HPI.  All other systems are negative.  Physical Exam: BP 142/50  Pulse 59  Ht 5\' 9"  (1.753 m)  Wt 195 lb (88.451 kg)  BMI 28.78 kg/m2 The patient is alert and oriented x 3.  The mood and affect are normal.   Skin: warm and dry.  Color is normal.    HEENT:   the sclera are nonicteric.  The mucous membranes are moist.  The carotids are 2+ without bruits.   There is no thyromegaly.  There is no JVD.    Lungs: clear.  The chest wall is non tender.    Heart: regular rate with a normal S1 and S2.  There is a soft systolic murmur. The PMI is not displaced.     Abdomen: good bowel sounds.  There is no guarding or rebound.  There is no hepatosplenomegaly or tenderness.  There are no masses.   Extremities:  no clubbing, cyanosis, or edema.  The legs are without rashes.  The distal pulses are intact.   Neuro:  Cranial nerves II - XII are intact.  Motor and sensory functions are intact.    He walks with the assistance of a walker.  ECG: Nov.6 2014:  Sinus brady at 61. LAD, NS ST abn.  Assessment / Plan:

## 2013-10-30 ENCOUNTER — Other Ambulatory Visit: Payer: Self-pay | Admitting: Cardiovascular Disease

## 2013-11-04 ENCOUNTER — Other Ambulatory Visit: Payer: Self-pay | Admitting: Cardiovascular Disease

## 2014-02-28 ENCOUNTER — Other Ambulatory Visit: Payer: Self-pay | Admitting: Cardiovascular Disease

## 2014-03-25 ENCOUNTER — Other Ambulatory Visit: Payer: Self-pay | Admitting: Cardiovascular Disease

## 2014-04-01 ENCOUNTER — Other Ambulatory Visit: Payer: Self-pay | Admitting: Cardiovascular Disease

## 2014-04-17 ENCOUNTER — Encounter: Payer: Self-pay | Admitting: Cardiovascular Disease

## 2014-04-22 ENCOUNTER — Other Ambulatory Visit: Payer: Self-pay | Admitting: Cardiovascular Disease

## 2014-04-23 ENCOUNTER — Ambulatory Visit: Payer: Medicare Other | Admitting: Cardiovascular Disease

## 2014-04-24 ENCOUNTER — Ambulatory Visit (INDEPENDENT_AMBULATORY_CARE_PROVIDER_SITE_OTHER): Payer: Medicare Other | Admitting: Cardiovascular Disease

## 2014-04-24 ENCOUNTER — Encounter: Payer: Self-pay | Admitting: Cardiovascular Disease

## 2014-04-24 VITALS — BP 130/54 | HR 60 | Ht 69.0 in | Wt 196.0 lb

## 2014-04-24 DIAGNOSIS — I2510 Atherosclerotic heart disease of native coronary artery without angina pectoris: Secondary | ICD-10-CM

## 2014-04-24 DIAGNOSIS — I10 Essential (primary) hypertension: Secondary | ICD-10-CM

## 2014-04-24 NOTE — Assessment & Plan Note (Signed)
[name] is doing well.  No angina.   Continue current meds.  His lipids are well controled.

## 2014-04-24 NOTE — Assessment & Plan Note (Signed)
bp is well controlled.  

## 2014-04-24 NOTE — Patient Instructions (Signed)
Your physician recommends that you continue on your current medications as directed. Please refer to the Current Medication list given to you today.  Your physician wants you to follow-up in: 1 year with Dr. Nahser.  You will receive a reminder letter in the mail two months in advance. If you don't receive a letter, please call our office to schedule the follow-up appointment. Your physician recommends that you return for lab work in: 1 year on the day of or a few days before your office visit with Dr. Nahser.  You will need to FAST for this appointment - nothing to eat or drink after midnight the night before except water.   

## 2014-04-24 NOTE — Progress Notes (Signed)
[name]  Date of Birth  04-Oct-1928 [facility] [address]     [address] [place], [place]  [id number] [phone]  Fax  [phone]  Problem List 1. CAD - s/p CABG 2. Peripheral neuropathy 3. HTN 4. Hyperlipidemia 5. Diabetes Mellitus  History of Present Illness:  80 year old gentleman with a history of coronary artery disease and coronary artery bypass grafting. He has a history of hypertension.  He's not had any significant episodes of chest pain or shortness of breath. He does get short of breath he tries to walk too quickly or walks without his walker.  Ranexa is fairly expensive and he asked if it was absolutely necessary.    He walks with the assistance of a walker because of some low back pain. He's able to get around in his house without a walker fairly well. He had right hip replacement this past year. He's not had any significant pain.   Nov. 6, 2014:  Mr. [name] is doing well.  Getting around with his walker ( has balance problems and back pain)  No cardiac issues.   April 24, 2014:  Dylan BurdockRichard is doing ok.  Still using the walker for balance.  Has neuropathy.   No Cp , no dyspnea.    Current Outpatient Prescriptions  Medication Sig Dispense Refill  . amLODipine (NORVASC) 2.5 MG tablet TAKE 1 TABLET BY MOUTH DAILY  30 tablet  3  . aspirin 325 MG tablet Take 325 mg by mouth daily.      Marland Kitchen. atenolol (TENORMIN) 50 MG tablet TAKE 1 TABLET BY MOUTH DAILY  30 tablet  3  . DICLOFENAC SODIUM PO Take 75 mg by mouth 2 (two) times daily.      Marland Kitchen. esomeprazole (NEXIUM) 40 MG capsule Take 40 mg by mouth daily before breakfast.        . fenofibrate 160 MG tablet Take 160 mg by mouth daily.        Marland Kitchen. gabapentin (NEURONTIN) 300 MG capsule Take 300 mg by mouth. Taking Two Capsule TID      . Insulin Glargine (LANTUS [place]) Inject 40 Units into the skin daily.      . Insulin Lispro, Human, (HUMALOG PEN [place]) Inject into the skin. Slide  Scale      . lisinopril (PRINIVIL,ZESTRIL) 40 MG tablet Take 20 mg by mouth daily.       Marland Kitchen. RANEXA 500 MG 12 hr tablet TAKE 1 TABLET TWICE A DAY  60 tablet  3  . simvastatin (ZOCOR) 20 MG tablet TAKE 1 TABLET BY MOUTH AT BEDTIME  30 tablet  0  . traMADol (ULTRAM) 50 MG tablet Take 50 mg by mouth 2 (two) times daily.      . [DISCONTINUED] ferrous sulfate 325 (65 FE) MG tablet Take 1 tablet (325 mg total) by mouth 3 (three) times daily after meals.       No current facility-administered medications for this visit.     Allergies  Allergen Reactions  . Tetracyclines & Related     Past Medical History  Diagnosis Date  . Hypertension   . Diabetes mellitus   . Dyslipidemia   . Coronary artery disease     status post CABG. He is also status post PTCA and stenting of the circumflex artery  . Heart murmur   . Renal calculi   . Peripheral autonomic neuropathy due to diabetes mellitus     Past Surgical History  Procedure  Laterality Date  . Lithotripsy    . US echocardiography  01-16-09    EF 55-60  . Cardiovascular stress test  09-26-03    EF 43%  . Cardiac catheterization    . Coronary artery bypass graft      CABG X 4  . Coronary angioplasty with stent placement    . Tonsillectomy    . Cataract extraction w/ intraocular lens implant      right  . Back surgery      x6  . Total hip arthroplasty  10/29/2011    Procedure: TOTAL HIP ARTHROPLASTY;  Surgeon: Shelda Pal;  Location: [facility];  Service: Orthopedics;  Laterality: Right;    History  Smoking status  . Former Smoker  . Types: Pipe  Smokeless tobacco  . Never Used    History  Alcohol Use No    No family history on file.  Reviw of Systems:  Reviewed in the HPI.  All other systems are negative.  Physical Exam: BP 130/54  Pulse 60  Ht 5\' 9"  (1.753 m)  Wt 196 lb (88.905 kg)  BMI 28.93 kg/m2 The patient is alert and oriented x 3.  The mood and affect are normal.   Skin: warm and dry.  Color is normal.     HEENT:   the sclera are nonicteric.  The mucous membranes are moist.  The carotids are 2+ without bruits.  There is no thyromegaly.  There is no JVD.    Lungs: clear.  The chest wall is non tender.    Heart: regular rate with a normal S1 and S2.  There is a soft systolic murmur. The PMI is not displaced.     Abdomen: good bowel sounds.  There is no guarding or rebound.  There is no hepatosplenomegaly or tenderness.  There are no masses.   Extremities:  no clubbing, cyanosis, or edema.  The legs are without rashes.  The distal pulses are intact.   Neuro:  Cranial nerves II - XII are intact.  Motor and sensory functions are intact.    He walks with the assistance of a walker.  ECG: Nov.6 2014:  Sinus brady at 61. LAD, NS ST abn.  Assessment / Plan:

## 2014-04-29 ENCOUNTER — Other Ambulatory Visit: Payer: Self-pay | Admitting: Cardiovascular Disease

## 2014-06-29 ENCOUNTER — Other Ambulatory Visit: Payer: Self-pay | Admitting: Cardiovascular Disease

## 2014-07-04 ENCOUNTER — Other Ambulatory Visit: Payer: Self-pay | Admitting: Cardiovascular Disease

## 2014-07-06 ENCOUNTER — Other Ambulatory Visit: Payer: Self-pay | Admitting: Cardiovascular Disease

## 2014-07-23 ENCOUNTER — Telehealth: Payer: Self-pay | Admitting: Cardiovascular Disease

## 2014-07-23 NOTE — Telephone Encounter (Signed)
Spoke with patient. He describes "mild" intermittent chest pain with activity (walking down sidewalk). Pain resolves in about 10-15 minutes when he stops the activity and rests. Denies associated SOB, no dizziness, lightheadedness, sweating. Hx CABG 1997 per pt report.  Would like to see Dr. Elease Hashimoto before he drives to [place] next week. Appointment made for next Tues.  Also offered to look for sooner appointment with physician extender.  He declined that.  Wants to see Dr. Elease Hashimoto only.

## 2014-07-23 NOTE — Telephone Encounter (Signed)
New problem   Pt is having active chest pain.

## 2014-07-23 NOTE — Telephone Encounter (Signed)
The patient denies active chest pain currently.

## 2014-07-30 ENCOUNTER — Ambulatory Visit (INDEPENDENT_AMBULATORY_CARE_PROVIDER_SITE_OTHER): Payer: Medicare Other | Admitting: Cardiovascular Disease

## 2014-07-30 ENCOUNTER — Encounter: Payer: Self-pay | Admitting: Cardiovascular Disease

## 2014-07-30 VITALS — BP 116/50 | HR 58 | Ht 69.0 in | Wt 192.2 lb

## 2014-07-30 DIAGNOSIS — E1159 Type 2 diabetes mellitus with other circulatory complications: Secondary | ICD-10-CM

## 2014-07-30 DIAGNOSIS — E1140 Type 2 diabetes mellitus with diabetic neuropathy, unspecified: Secondary | ICD-10-CM

## 2014-07-30 DIAGNOSIS — E1142 Type 2 diabetes mellitus with diabetic polyneuropathy: Secondary | ICD-10-CM

## 2014-07-30 DIAGNOSIS — I10 Essential (primary) hypertension: Secondary | ICD-10-CM

## 2014-07-30 DIAGNOSIS — E1149 Type 2 diabetes mellitus with other diabetic neurological complication: Secondary | ICD-10-CM

## 2014-07-30 DIAGNOSIS — I2510 Atherosclerotic heart disease of native coronary artery without angina pectoris: Secondary | ICD-10-CM

## 2014-07-30 MED ORDER — RANOLAZINE ER 1000 MG PO TB12: 1000.0000 mg | ORAL_TABLET | Freq: Two times a day (BID) | ORAL | Status: DC

## 2014-07-30 MED ORDER — ISOSORBIDE MONONITRATE ER 30 MG PO TB24: 30.0000 mg | ORAL_TABLET | Freq: Every day | ORAL | Status: DC

## 2014-07-30 MED ORDER — ASPIRIN EC 81 MG PO TBEC: 81.0000 mg | DELAYED_RELEASE_TABLET | Freq: Every day | ORAL | Status: AC

## 2014-07-30 NOTE — Patient Instructions (Addendum)
Your physician has recommended you make the following change in your medication:  1) DECREASE Aspirin to  Daily 2) INCREASE Ranexa to  TWICE daily  2) START Imdur  daily   Your physician recommends that you schedule a follow-up appointment in 2-3 months with Dr. Elease Hashimoto.  You will also have fasting lab work (Lipids, liver, BMET) at this time. Do not drink or eat anything after midnight before testing other than water.

## 2014-07-30 NOTE — Progress Notes (Signed)
[name]  Date of Birth  1928-04-24 [facility] [address]     [address] [place], [place]  [address] [phone]  Fax  [phone]  Problem List 1. CAD - s/p CABG 2. Peripheral neuropathy 3. HTN 4. Hyperlipidemia 5. Diabetes Mellitus  History of Present Illness:  80 year old gentleman with a history of coronary artery disease and coronary artery bypass grafting. He has a history of hypertension.  He's not had any significant episodes of chest pain or shortness of breath. He does get short of breath he tries to walk too quickly or walks without his walker.  Ranexa is fairly expensive and he asked if it was absolutely necessary.    He walks with the assistance of a walker because of some low back pain. He's able to get around in his house without a walker fairly well. He had right hip replacement this past year. He's not had any significant pain.   Nov. 6, 2014:  [name] is doing well.  Getting around with his walker ( has balance problems and back pain)  No cardiac issues.   April 24, 2014:  [name] is doing ok.  Still using the walker for balance.  Has neuropathy.   No Cp , no dyspnea.   07/30/2014:  [name] is an 38 gentleman with history history of CAD. He's been experiencing some mid sternal chest discomfort. This typically occurs when he wakes up and then when he goes to bed.  The pain lasts for a few minutes. He has not had to take any nitroglycerin.  Despite having these pains are typically occur in the morning and also in the evening, he's able to do all his normal activities each day without CP or angina.  . He walks with the assistance of a walker which is primarily for balance.    He has some spinal issues and diabetic neuropathy.  He does have shortness breath with exertion. This resolves when he stops and sits down and rests for a few minutes.   Current Outpatient Prescriptions  Medication Sig Dispense  Refill  . amLODipine (NORVASC) 2.5 MG tablet TAKE 1 TABLET BY MOUTH DAILY  30 tablet  3  . aspirin 325 MG tablet Take 325 mg by mouth daily.      Marland Kitchen atenolol (TENORMIN) 50 MG tablet TAKE 1 TABLET BY MOUTH DAILY  30 tablet  3  . DICLOFENAC SODIUM PO Take 75 mg by mouth 2 (two) times daily.      Marland Kitchen esomeprazole (NEXIUM) 40 MG capsule Take 40 mg by mouth daily before breakfast.        . fenofibrate 160 MG tablet Take 160 mg by mouth daily.        Marland Kitchen gabapentin (NEURONTIN) 300 MG capsule Take 300 mg by mouth 2 (two) times daily. Taking Two Capsule BID      . Insulin Glargine (LANTUS [place]) Inject 40 Units into the skin daily.      . Insulin Lispro, Human, (HUMALOG PEN [place]) Inject into the skin. Slide Scale      . lisinopril (PRINIVIL,ZESTRIL) 40 MG tablet Take 20 mg by mouth daily.       Marland Kitchen RANEXA 500 MG 12 hr tablet TAKE 1 TABLET BY MOUTH TWICE A DAY  60 tablet  5  . simvastatin (ZOCOR) 20 MG tablet TAKE 1 TABLET BY MOUTH AT BEDTIME  30 tablet  6  . traMADol (ULTRAM) 50 MG tablet Take 50  mg by mouth 2 (two) times daily.      . [DISCONTINUED] ferrous sulfate 325 (65 FE) MG tablet Take 1 tablet (325 mg total) by mouth 3 (three) times daily after meals.       No current facility-administered medications for this visit.     Allergies  Allergen Reactions  . Tetracyclines & Related     Past Medical History  Diagnosis Date  . Hypertension   . Diabetes mellitus   . Dyslipidemia   . Coronary artery disease     status post CABG. He is also status post PTCA and stenting of the circumflex artery  . Heart murmur   . Renal calculi   . Peripheral autonomic neuropathy due to diabetes mellitus     Past Surgical History  Procedure Laterality Date  . Lithotripsy    . US echocardiography  01-16-09    EF 55-60  . Cardiovascular stress test  09-26-03    EF 43%  . Cardiac catheterization    . Coronary artery bypass graft      CABG X 4  . Coronary angioplasty with stent placement    . Tonsillectomy    .  Cataract extraction w/ intraocular lens implant      right  . Back surgery      x6  . Total hip arthroplasty  10/29/2011    Procedure: TOTAL HIP ARTHROPLASTY;  Surgeon: Shelda Pal;  Location: [facility];  Service: Orthopedics;  Laterality: Right;    History  Smoking status  . Former Smoker  . Types: Pipe  Smokeless tobacco  . Never Used    History  Alcohol Use No    No family history on file.  Reviw of Systems:  Reviewed in the HPI.  All other systems are negative.  Physical Exam: BP 116/50  Pulse 58  Ht  (1.753 m)  Wt 192 lb 3.2 oz (87.181 kg)  BMI 28.37 kg/m2 The patient is alert and oriented x 3.  The mood and affect are normal.   Skin: warm and dry.  Color is normal.    HEENT:   the sclera are nonicteric.  The mucous membranes are moist.  The carotids are 2+ without bruits.  There is no thyromegaly.  There is no JVD.    Lungs: clear.  The chest wall is non tender.    Heart: regular rate with a normal S1 and S2.  There is a soft systolic murmur. The PMI is not displaced.     Abdomen: good bowel sounds.  There is no guarding or rebound.  There is no hepatosplenomegaly or tenderness.  There are no masses.   Extremities:  no clubbing, cyanosis, or edema.  The legs are without rashes.  The distal pulses are intact.   Neuro:  Cranial nerves II - XII are intact.  Motor and sensory functions are intact.    He walks with the assistance of a walker.  ECG: Sept. 8, 2015:  Sinus brady, RAD, NS ST abn.  Assessment / Plan:

## 2014-07-30 NOTE — Assessment & Plan Note (Signed)
[name] presents today with episodes of chest discomfort. These episodes of discomfort are somewhat new. He has known CAD. We did a cardiac catheterization in 2004 which revealed a patent LIMA to LAD and a patent SVG to his 1st  Diagonal.  The graft to his circumflex system were occluded.  I do not think that a Myoview study would be of much benefit since he has known disease in the circumflex system. If these pains are we'll then we will need to consider cardiac cath position. We'll add isosorbide 30 mg a day and increase Ranexa to 1000 mg BID.    He's leaving for [place] tomorrow and will be on 2 weeks. He'll call as soon as he  returns to let us know if he is feeling  Better.  If He's not feeling better then we'll need to proceed with cardiac catheterization. I'll see him again in 2-3 months for followup visit.

## 2014-08-26 ENCOUNTER — Other Ambulatory Visit: Payer: Self-pay | Admitting: Cardiovascular Disease

## 2014-09-19 ENCOUNTER — Telehealth: Payer: Self-pay | Admitting: Cardiovascular Disease

## 2014-09-19 MED ORDER — ISOSORBIDE MONONITRATE ER 60 MG PO TB24: 60.0000 mg | ORAL_TABLET | Freq: Every day | ORAL | Status: DC

## 2014-09-19 NOTE — Telephone Encounter (Signed)
Spoke with patient who states Dr. Elease HashimotoNahser prescribed Isosorbide at last ov and it seems to be working well  States medication seems to wear off at approximately 5 pm; states feels chest pain with exertional activity, not heavy chest pain but he wants to know if he can take another pill  I advised that the medication is a 24 hr tablet and asked if Ranexa which patient is taking BID is helping his symptoms; patient states the Ranexa does not seem to help him but the Isosorbide does I advised patient that I will talk with Dr. Elease HashimotoNahser who is in the office today and will call him back with his advice; patient verbalized understanding and agreement

## 2014-09-19 NOTE — Telephone Encounter (Signed)
Spoke with patient and reviewed Dr. Harvie BridgeNahser's advice with him.  Imdur 60 mg Rx sent to [facility] per patient request.  Patient verbalized understanding and agreement and states he will call back if symptoms do not improve.

## 2014-09-19 NOTE — Telephone Encounter (Signed)
New message          Pt has questions about a recent medication change

## 2014-09-19 NOTE — Telephone Encounter (Signed)
The Imdur is designed to wear off around that time - which prevents the patient from becoming tolerent to it. We can increase imdur to 60 a day. The Ranexa takes 3-4 weeks to start working

## 2014-10-29 ENCOUNTER — Other Ambulatory Visit: Payer: Self-pay | Admitting: Cardiovascular Disease

## 2014-10-29 ENCOUNTER — Ambulatory Visit: Payer: Medicare Other | Admitting: Cardiovascular Disease

## 2014-10-29 ENCOUNTER — Other Ambulatory Visit: Payer: Medicare Other

## 2014-11-27 ENCOUNTER — Other Ambulatory Visit (INDEPENDENT_AMBULATORY_CARE_PROVIDER_SITE_OTHER): Payer: Medicare Other | Admitting: *Deleted

## 2014-11-27 DIAGNOSIS — I2510 Atherosclerotic heart disease of native coronary artery without angina pectoris: Secondary | ICD-10-CM

## 2014-11-27 LAB — BASIC METABOLIC PANEL
BUN: 29 mg/dL — ABNORMAL HIGH (ref 6–23)
CHLORIDE: 106 meq/L (ref 96–112)
CO2: 30 meq/L (ref 19–32)
Calcium: 9.1 mg/dL (ref 8.4–10.5)
Creatinine, Ser: 1.4 mg/dL (ref 0.4–1.5)
GFR: 53.46 mL/min — ABNORMAL LOW (ref >60.00)
GLUCOSE: 97 mg/dL (ref 70–99)
Potassium: 4.2 mEq/L (ref 3.5–5.1)
Sodium: 140 mEq/L (ref 135–145)

## 2014-11-27 LAB — LIPID PANEL
Cholesterol: 117 mg/dL (ref 0–200)
HDL: 37.2 mg/dL — AB (ref >39.00)
LDL Cholesterol: 65 mg/dL (ref 0–99)
NonHDL: 79.8
TRIGLYCERIDES: 75 mg/dL (ref 0.0–149.0)
Total CHOL/HDL Ratio: 3
VLDL: 15 mg/dL (ref 0.0–40.0)

## 2014-11-27 LAB — HEPATIC FUNCTION PANEL
ALBUMIN: 3.5 g/dL (ref 3.5–5.2)
ALT: 12 U/L (ref 0–53)
AST: 17 U/L (ref 0–37)
Alkaline Phosphatase: 37 U/L — ABNORMAL LOW (ref 39–117)
BILIRUBIN DIRECT: 0.1 mg/dL (ref 0.0–0.3)
BILIRUBIN TOTAL: 0.8 mg/dL (ref 0.2–1.2)
TOTAL PROTEIN: 6.3 g/dL (ref 6.0–8.3)

## 2014-11-28 ENCOUNTER — Encounter (INDEPENDENT_AMBULATORY_CARE_PROVIDER_SITE_OTHER): Payer: Self-pay

## 2014-11-28 ENCOUNTER — Ambulatory Visit (INDEPENDENT_AMBULATORY_CARE_PROVIDER_SITE_OTHER): Payer: Medicare Other | Admitting: Cardiovascular Disease

## 2014-11-28 ENCOUNTER — Encounter: Payer: Self-pay | Admitting: Cardiovascular Disease

## 2014-11-28 VITALS — BP 126/62 | HR 62 | Ht 69.0 in | Wt 200.4 lb

## 2014-11-28 DIAGNOSIS — E785 Hyperlipidemia, unspecified: Secondary | ICD-10-CM

## 2014-11-28 DIAGNOSIS — R0609 Other forms of dyspnea: Secondary | ICD-10-CM

## 2014-11-28 MED ORDER — ISOSORBIDE MONONITRATE ER 30 MG PO TB24: 30.0000 mg | ORAL_TABLET | Freq: Every day | ORAL | Status: DC

## 2014-11-28 NOTE — Assessment & Plan Note (Signed)
Lipids are well controlled. Continue current meds

## 2014-11-28 NOTE — Progress Notes (Signed)
[name]  Date of Birth  06/19/1928 [name] / [name] [address]     [address] [place], [place]  [address] [phone]  Fax  [phone]  Problem List 1. CAD - s/p CABG 2. Peripheral neuropathy 3. HTN 4. Hyperlipidemia 5. Diabetes Mellitus  History of Present Illness:  81 year old gentleman with a history of coronary artery disease and coronary artery bypass grafting. He has a history of hypertension.  He's not had any significant episodes of chest pain or shortness of breath. He does get short of breath he tries to walk too quickly or walks without his walker.  Ranexa is fairly expensive and he asked if it was absolutely necessary.    He walks with the assistance of a walker because of some low back pain. He's able to get around in his house without a walker fairly well. He had right hip replacement this past year. He's not had any significant pain.   Nov. 6, 2014:  [name] is doing well.  Getting around with his walker ( has balance problems and back pain)  No cardiac issues.   April 24, 2014:  [name] is doing ok.  Still using the walker for balance.  Has neuropathy.   No Cp , no dyspnea.   07/30/2014:  [name] is an 20 gentleman with history history of CAD. He's been experiencing some mid sternal chest discomfort. This typically occurs when he wakes up and then when he goes to bed.  The pain lasts for a few minutes. He has not had to take any nitroglycerin.  Despite having these pains are typically occur in the morning and also in the evening, he's able to do all his normal activities each day without CP or angina.  . He walks with the assistance of a walker which is primarily for balance.    He has some spinal issues and diabetic neuropathy.  He does have shortness breath with exertion. This resolves when he stops and sits down and rests for a few minutes.  Jan. 7, 2016:  [name] is doing well.  Hx of CAD.  We  increaed his Imdur to 60 but this caused him to feel poorly. He cut it back to 30 and now feels well. hes having some peripheral neuropathy in his legs.   He is having some DOE - this seems to be worse than before.     Current Outpatient Prescriptions  Medication Sig Dispense Refill  . amLODipine (NORVASC) 2.5 MG tablet TAKE 1 TABLET BY MOUTH EVERY DAY 30 tablet 3  . aspirin EC 81 MG tablet Take 1 tablet (81 mg total) by mouth daily.    Marland Kitchen atenolol (TENORMIN) 50 MG tablet TAKE 1 TABLET BY MOUTH DAILY 30 tablet 6  . esomeprazole (NEXIUM) 40 MG capsule Take 40 mg by mouth daily before breakfast.      . fenofibrate 160 MG tablet Take 160 mg by mouth daily.      Marland Kitchen gabapentin (NEURONTIN) 300 MG capsule Take 300 mg by mouth 2 (two) times daily. Taking Two Capsule BID    . Insulin Glargine (LANTUS [place]) Inject 40 Units into the skin daily.    . Insulin Lispro, Human, (HUMALOG PEN [place]) Inject into the skin. Slide Scale    . isosorbide mononitrate (IMDUR) 30 MG 24 hr tablet Take 30 mg by mouth daily.  11  . lisinopril (PRINIVIL,ZESTRIL) 20 MG tablet Take 20 mg by mouth daily.  9  .  ranolazine (RANEXA) 1000 MG SR tablet Take 1 tablet (1,000 mg total) by mouth 2 (two) times daily. 60 tablet 11  . simvastatin (ZOCOR) 20 MG tablet TAKE 1 TABLET BY MOUTH AT BEDTIME 30 tablet 6  . traMADol (ULTRAM) 50 MG tablet Take 50 mg by mouth 2 (two) times daily.    . [DISCONTINUED] ferrous sulfate 325 (65 FE) MG tablet Take 1 tablet (325 mg total) by mouth 3 (three) times daily after meals.     No current facility-administered medications for this visit.     Allergies  Allergen Reactions  . Tetracyclines & Related     "Raw throat"    Past Medical History  Diagnosis Date  . Hypertension   . Diabetes mellitus   . Dyslipidemia   . Coronary artery disease     status post CABG. He is also status post PTCA and stenting of the circumflex artery  . Heart murmur   . Renal calculi   . Peripheral autonomic  neuropathy due to diabetes mellitus     Past Surgical History  Procedure Laterality Date  . Lithotripsy    . [place] echocardiography  01-16-09    EF 55-60  . Cardiovascular stress test  09-26-03    EF 43%  . Cardiac catheterization    . Coronary artery bypass graft      CABG X 4  . Coronary angioplasty with stent placement    . Tonsillectomy    . Cataract extraction w/ intraocular lens implant      right  . Back surgery      x6  . Total hip arthroplasty  10/29/2011    Procedure: TOTAL HIP ARTHROPLASTY;  Surgeon: Shelda PalMatthew D Olin;  Location: [facility];  Service: Orthopedics;  Laterality: Right;    History  Smoking status  . Former Smoker  . Types: Pipe  Smokeless tobacco  . Never Used    History  Alcohol Use No    No family history on file.  Reviw of Systems:  Reviewed in the HPI.  All other systems are negative.  Physical Exam: BP 126/62 mmHg  Pulse 62  Ht 5\' 9"  (1.753 m)  Wt 200 lb 6.4 oz (90.901 kg)  BMI 29.58 kg/m2  SpO2 96% The patient is alert and oriented x 3.  The mood and affect are normal.   Skin: warm and dry.  Color is normal.    HEENT:   the sclera are nonicteric.  The mucous membranes are moist.  The carotids are 2+ without bruits.  There is no thyromegaly.  There is no JVD.    Lungs: clear.  The chest wall is non tender.    Heart: regular rate with a normal S1 and S2.  There is a soft systolic murmur. The PMI is not displaced.     Abdomen: good bowel sounds.  There is no guarding or rebound.  There is no hepatosplenomegaly or tenderness.  There are no masses.   Extremities:  no clubbing, cyanosis, or edema.  The legs are without rashes.  The distal pulses are intact.   Neuro:  Cranial nerves II - XII are intact.  Motor and sensory functions are intact.    He walks with the assistance of a walker.  ECG: Sept. 8, 2015:  Sinus brady, RAD, NS ST abn.  Assessment / Plan:

## 2014-11-28 NOTE — Assessment & Plan Note (Signed)
Mr. [name] seems to be fairly stable. He's not having any significant angina. He does have some shortness of breath which is fairly new. We'll get an echocardiogram for further evaluation of his LV function.  I described unstable angina versus unstable angina. At this point he seems to be very stable Continue same meds.

## 2014-11-28 NOTE — Patient Instructions (Signed)
Your physician has recommended you make the following change in your medication:  1) DECREASE Imdur to 30mg  daily  Your physician has requested that you have an echocardiogram. Echocardiography is a painless test that uses sound waves to create images of your heart. It provides your doctor with information about the size and shape of your heart and how well your heart's chambers and valves are working. This procedure takes approximately one hour. There are no restrictions for this procedure.  Your physician wants you to follow-up in: 6 months with Dr. Elease HashimotoNahser. You will receive a reminder letter in the mail two months in advance. If you don't receive a letter, please call our office to schedule the follow-up appointment.

## 2014-12-03 ENCOUNTER — Other Ambulatory Visit: Payer: Self-pay | Admitting: Cardiovascular Disease

## 2014-12-03 ENCOUNTER — Ambulatory Visit (HOSPITAL_COMMUNITY): Payer: Medicare Other | Attending: Cardiology

## 2014-12-03 DIAGNOSIS — I2510 Atherosclerotic heart disease of native coronary artery without angina pectoris: Secondary | ICD-10-CM | POA: Insufficient documentation

## 2014-12-03 DIAGNOSIS — E119 Type 2 diabetes mellitus without complications: Secondary | ICD-10-CM | POA: Insufficient documentation

## 2014-12-03 DIAGNOSIS — Z87891 Personal history of nicotine dependence: Secondary | ICD-10-CM | POA: Diagnosis not present

## 2014-12-03 DIAGNOSIS — E785 Hyperlipidemia, unspecified: Secondary | ICD-10-CM | POA: Diagnosis not present

## 2014-12-03 DIAGNOSIS — I10 Essential (primary) hypertension: Secondary | ICD-10-CM | POA: Insufficient documentation

## 2014-12-03 DIAGNOSIS — R0609 Other forms of dyspnea: Secondary | ICD-10-CM | POA: Diagnosis not present

## 2014-12-03 DIAGNOSIS — R410 Disorientation, unspecified: Secondary | ICD-10-CM | POA: Insufficient documentation

## 2014-12-03 NOTE — Progress Notes (Signed)
2D Echo completed. 12/03/2014 

## 2014-12-09 ENCOUNTER — Telehealth: Payer: Self-pay | Admitting: Cardiovascular Disease

## 2014-12-09 NOTE — Telephone Encounter (Signed)
I spoke with pt & he is aware of the ECHO results Mylo Redebbie Ashar Lewinski RN

## 2014-12-09 NOTE — Telephone Encounter (Signed)
New Msg        Pt returning call from Friday and would like to get test results.

## 2015-01-01 ENCOUNTER — Other Ambulatory Visit (HOSPITAL_COMMUNITY): Payer: Self-pay | Admitting: Radiology

## 2015-01-01 DIAGNOSIS — R0602 Shortness of breath: Secondary | ICD-10-CM

## 2015-01-06 ENCOUNTER — Encounter (HOSPITAL_COMMUNITY): Payer: Medicare Other

## 2015-01-07 ENCOUNTER — Other Ambulatory Visit: Payer: Self-pay | Admitting: Cardiovascular Disease

## 2015-01-08 ENCOUNTER — Ambulatory Visit (HOSPITAL_COMMUNITY)
Admission: RE | Admit: 2015-01-08 | Discharge: 2015-01-08 | Disposition: A | Discharge status: Home / Self Care | Payer: Medicare Other | Source: Ambulatory Visit | Attending: Internal Medicine | Admitting: Internal Medicine

## 2015-01-08 DIAGNOSIS — R0602 Shortness of breath: Secondary | ICD-10-CM | POA: Diagnosis not present

## 2015-01-08 MED ORDER — ALBUTEROL SULFATE (2.5 MG/3ML) 0.083% IN NEBU
2.5000 mg | INHALATION_SOLUTION | Freq: Once | RESPIRATORY_TRACT | Status: AC
  Administered 2015-01-08: 2.5 mg via RESPIRATORY_TRACT

## 2015-01-09 LAB — PULMONARY FUNCTION TEST
DL/VA % PRED: 73 %
DL/VA: 3.31 ml/min/mmHg/L
DLCO unc % pred: 39 %
DLCO unc: 12.27 ml/min/mmHg
FEF 25-75 Post: 3.09 L/sec
FEF 25-75 Pre: 2.67 L/sec
FEF2575-%Change-Post: 15 %
FEF2575-%PRED-POST: 186 %
FEF2575-%Pred-Pre: 160 %
FEV1-%CHANGE-POST: 4 %
FEV1-%PRED-POST: 80 %
FEV1-%PRED-PRE: 76 %
FEV1-POST: 2.04 L
FEV1-Pre: 1.96 L
FEV1FVC-%CHANGE-POST: 5 %
FEV1FVC-%Pred-Pre: 117 %
FEV6-%CHANGE-POST: -1 %
FEV6-%PRED-PRE: 69 %
FEV6-%Pred-Post: 68 %
FEV6-Post: 2.32 L
FEV6-Pre: 2.34 L
FEV6FVC-%PRED-PRE: 108 %
FEV6FVC-%Pred-Post: 108 %
FVC-%Change-Post: -1 %
FVC-%Pred-Post: 63 %
FVC-%Pred-Pre: 63 %
FVC-Post: 2.32 L
FVC-Pre: 2.34 L
POST FEV1/FVC RATIO: 88 %
POST FEV6/FVC RATIO: 100 %
PRE FEV6/FVC RATIO: 100 %
Pre FEV1/FVC ratio: 83 %
RV % pred: 58 %
RV: 1.59 L
TLC % PRED: 59 %
TLC: 4.07 L

## 2015-05-27 NOTE — Progress Notes (Signed)
[name]  Date of Birth  [birth year] [facility]  [address] , [address] [phone]  Fax    Problem List 1. CAD - s/p CABG 2. Peripheral neuropathy 3. HTN 4. Hyperlipidemia 5. Diabetes Mellitus  History of Present Illness:  81 year old gentleman with a history of coronary artery disease and coronary artery bypass grafting. He has a history of hypertension.  He's not had any significant episodes of chest pain or shortness of breath. He does get short of breath he tries to walk too quickly or walks without his walker.  Ranexa is fairly expensive and he asked if it was absolutely necessary.    He walks with the assistance of a walker because of some low back pain. He's able to get around in his house without a walker fairly well. He had right hip replacement this past year. He's not had any significant pain.   Nov. 6, 2014:  Mr. [name] is doing well.  Getting around with his walker ( has balance problems and back pain)  No cardiac issues.   April 24, 2014:  [name] is doing ok.  Still using the walker for balance.  Has neuropathy.   No Cp , no dyspnea.   07/30/2014:  Mr. [name] is an 59 gentleman with history history of CAD. He's been experiencing some mid sternal chest discomfort. This typically occurs when he wakes up and then when he goes to bed.  The pain lasts for a few minutes. He has not had to take any nitroglycerin.  Despite having these pains are typically occur in the morning and also in the evening, he's able to do all his normal activities each day without CP or angina.  . He walks with the assistance of a walker which is primarily for balance.    He has some spinal issues and diabetic neuropathy.  He does have shortness breath with exertion. This resolves when he stops and sits down and rests for a few minutes.  Jan. 7, 2016:  Mr. [name] is doing well.  Hx of CAD.  We increaed his Imdur to 60 but this caused him to feel poorly. He cut it back to  30 and now feels well. hes having some peripheral neuropathy in his legs.   He is having some DOE - this seems to be worse than before.  May 28, 2015:  Mr. [name] is seen for a follow up visit.   His CP are much better after increasing his Ranexa and adding Imdur.  Only trouble is his legs.  Has neuropathic pain .    Current Outpatient Prescriptions  Medication Sig Dispense Refill  . amLODipine (NORVASC) 2.5 MG tablet TAKE 1 TABLET BY MOUTH EVERY DAY 30 tablet 6  . aspirin EC 81 MG tablet Take 1 tablet (81 mg total) by mouth daily.    Marland Kitchen atenolol (TENORMIN) 50 MG tablet TAKE 1 TABLET BY MOUTH DAILY 30 tablet 6  . esomeprazole (NEXIUM) 40 MG capsule Take 40 mg by mouth daily before breakfast.      . fenofibrate 160 MG tablet Take 160 mg by mouth daily.      Marland Kitchen gabapentin (NEURONTIN) 300 MG capsule Take 300 mg by mouth 2 (two) times daily. Taking Two Capsule BID    . Insulin Glargine (LANTUS [place]) Inject 40 Units into the skin daily.    . Insulin Lispro, Human, (HUMALOG PEN [place]) Inject into the skin. Slide Scale    . isosorbide mononitrate (IMDUR) 30  MG 24 hr tablet Take 1 tablet (30 mg total) by mouth daily. 90 tablet 11  . lisinopril (PRINIVIL,ZESTRIL) 20 MG tablet Take 20 mg by mouth daily.  9  . ranolazine (RANEXA) 1000 MG SR tablet Take 1 tablet (1,000 mg total) by mouth 2 (two) times daily. 60 tablet 11  . simvastatin (ZOCOR) 20 MG tablet TAKE 1 TABLET BY MOUTH AT BEDTIME 30 tablet 6  . traMADol (ULTRAM) 50 MG tablet Take 50 mg by mouth 2 (two) times daily.    . [DISCONTINUED] ferrous sulfate 325 (65 FE) MG tablet Take 1 tablet (325 mg total) by mouth 3 (three) times daily after meals.     No current facility-administered medications for this visit.     Allergies  Allergen Reactions  . Tetracyclines & Related     "Raw throat"    Past Medical History  Diagnosis Date  . Hypertension   . Diabetes mellitus   . Dyslipidemia   . Coronary artery disease     status post CABG. He is  also status post PTCA and stenting of the circumflex artery  . Heart murmur   . Renal calculi   . Peripheral autonomic neuropathy due to diabetes mellitus     Past Surgical History  Procedure Laterality Date  . Lithotripsy    . US echocardiography  01-16-09    EF 55-60  . Cardiovascular stress test  09-26-03    EF 43%  . Cardiac catheterization    . Coronary artery bypass graft      CABG X 4  . Coronary angioplasty with stent placement    . Tonsillectomy    . Cataract extraction w/ intraocular lens implant      right  . Back surgery      x6  . Total hip arthroplasty  10/29/2011    Procedure: TOTAL HIP ARTHROPLASTY;  Surgeon: Shelda Pal;  Location: [facility];  Service: Orthopedics;  Laterality: Right;    History  Smoking status  . Former Smoker  . Types: Pipe  Smokeless tobacco  . Never Used    History  Alcohol Use No    No family history on file.  Reviw of Systems:  Reviewed in the HPI.  All other systems are negative.  Physical Exam: BP 148/62 mmHg  Pulse 65  Ht 5\' 9"  (1.753 m)  Wt 84.823 kg (187 lb)  BMI 27.60 kg/m2 The patient is alert and oriented x 3.  The mood and affect are normal.   Skin: warm and dry.  Color is normal.    HEENT:   the sclera are nonicteric.  The mucous membranes are moist.  The carotids are 2+ without bruits.  There is no thyromegaly.  There is no JVD.    Lungs: clear.  The chest wall is non tender.    Heart: regular rate with a normal S1 and S2.  There is a soft systolic murmur. The PMI is not displaced.     Abdomen: good bowel sounds.  There is no guarding or rebound.  There is no hepatosplenomegaly or tenderness.  There are no masses.   Extremities:  no clubbing, cyanosis,  Trace - 1+ edema.  The legs are without rashes.  The distal pulses are intact.   Neuro:  Cranial nerves II - XII are intact.  Motor and sensory functions are intact.    He walks with the assistance of a walker.  ECG: NSR at 65.  RAD    Assessment /  Plan:   1. CAD - s/p CABG- he's doing very well on the left neck and isosorbide. Continue current medications. I'll see him again in 6 months with fasting lipids, liver enzymes, and basic medical profile.  2. Peripheral neuropathy  3. HTN-  blood pressures typically well controlled.  4. Hyperlipidemia-  we'll check fasting lipids at his next office visit in 6 months.   5. Diabetes Mellitus     Yordin Rhoda, Deloris PingPhilip J, MD  05/28/2015 8:58 AM    [facility] [address],  [address] [place], [place]  [address] Pager [phone]- [phone] Phone: [phone]; Fax: [phone]   [facility]  [address] [place], [place]  [address] [phone]    Fax [phone]

## 2015-05-28 ENCOUNTER — Ambulatory Visit (INDEPENDENT_AMBULATORY_CARE_PROVIDER_SITE_OTHER): Payer: Medicare Other | Admitting: Cardiovascular Disease

## 2015-05-28 ENCOUNTER — Encounter: Payer: Self-pay | Admitting: Cardiovascular Disease

## 2015-05-28 VITALS — BP 148/62 | HR 65 | Ht 69.0 in | Wt 187.0 lb

## 2015-05-28 DIAGNOSIS — I2510 Atherosclerotic heart disease of native coronary artery without angina pectoris: Secondary | ICD-10-CM | POA: Diagnosis not present

## 2015-05-28 DIAGNOSIS — I10 Essential (primary) hypertension: Secondary | ICD-10-CM

## 2015-05-28 DIAGNOSIS — E785 Hyperlipidemia, unspecified: Secondary | ICD-10-CM | POA: Diagnosis not present

## 2015-05-28 NOTE — Patient Instructions (Signed)

## 2015-06-05 ENCOUNTER — Other Ambulatory Visit: Payer: Self-pay | Admitting: Cardiovascular Disease

## 2015-06-05 NOTE — Telephone Encounter (Signed)
Vesta MixerPhilip J Nahser, MD at 05/27/2015 4:03 PM   atenolol (TENORMIN) 50 MG tablet TAKE 1 TABLET BY MOUTH DAILY          Patient Instructions     Medication Instructions:  Your physician recommends that you continue on your current medications as directed. Please refer to the Current Medication list given to you today.

## 2015-07-28 ENCOUNTER — Other Ambulatory Visit: Payer: Self-pay | Admitting: Cardiovascular Disease

## 2015-08-11 ENCOUNTER — Other Ambulatory Visit: Payer: Self-pay | Admitting: Cardiovascular Disease

## 2015-09-07 ENCOUNTER — Other Ambulatory Visit: Payer: Self-pay | Admitting: Cardiovascular Disease

## 2015-11-07 ENCOUNTER — Ambulatory Visit (INDEPENDENT_AMBULATORY_CARE_PROVIDER_SITE_OTHER): Payer: Medicare Other | Admitting: Family Medicine

## 2015-11-07 ENCOUNTER — Ambulatory Visit (INDEPENDENT_AMBULATORY_CARE_PROVIDER_SITE_OTHER): Payer: Medicare Other

## 2015-11-07 VITALS — BP 148/60 | HR 71 | Temp 98.1°F | Resp 16 | Ht 69.0 in | Wt 200.0 lb

## 2015-11-07 DIAGNOSIS — M25512 Pain in left shoulder: Secondary | ICD-10-CM | POA: Diagnosis not present

## 2015-11-07 DIAGNOSIS — S40012A Contusion of left shoulder, initial encounter: Secondary | ICD-10-CM | POA: Diagnosis not present

## 2015-11-07 NOTE — Patient Instructions (Signed)
It appears that you escaped without any serious injury. However please let me know if your shoulder is not feeling better soon- also remember to keep your shoulder moving so it does not get stiff.  Take care!

## 2015-11-07 NOTE — Progress Notes (Signed)
[facility] [address], [place] [address] [phone]  Date:  11/07/2015   Name:  [name]   DOB:  04-24-1928   MRN:  [id number]  PCP:  Gwen Pounds, MD    Chief Complaint: Shoulder Injury   History of Present Illness:  [name] is a 81 y.o. very pleasant male patient who presents with the following:  History of DM, HTN, CAD.  Here today as a new patient.   He tripped fell around 2pm today and landed on his left shoulder.  He would like to make sure that his shoulder is ok  He has leg weakness/ neuropathy due to his DM and fell because her caught his left toe- fell sideways onto his left shoulder.  He is otherwise unhurt except for a skinned knee.   He is here with his wife today.   Patient Active Problem List   Diagnosis Date Noted  . Dyslipidemia 11/28/2014  . Type II or unspecified type diabetes mellitus with peripheral circulatory disorders, not stated as uncontrolled(250.70) 07/30/2014  . Confusion 10/30/2011  . Fracture of femoral neck, right (HCC) 10/28/2011  . HTN (hypertension) 10/28/2011  . Diabetes mellitus with neurological manifestation (HCC) 10/28/2011  . CAD (coronary artery disease) 08/06/2011    Past Medical History  Diagnosis Date  . Hypertension   . Diabetes mellitus   . Dyslipidemia   . Coronary artery disease     status post CABG. He is also status post PTCA and stenting of the circumflex artery  . Heart murmur   . Renal calculi   . 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 Cataract   . Myocardial infarction [facility])     Past Surgical History  Procedure Laterality Date  . Lithotripsy    . US echocardiography  01-16-09    EF 55-60  . Cardiovascular stress test  09-26-03    EF 43%  . Cardiac catheterization    . Coronary artery bypass graft      CABG X 4  . Coronary angioplasty with stent placement    . Tonsillectomy    . Cataract extraction w/ intraocular lens implant     right  . Back surgery      x6  . Total hip arthroplasty  10/29/2011    Procedure: TOTAL HIP ARTHROPLASTY;  Surgeon: Shelda Pal;  Location: [facility];  Service: Orthopedics;  Laterality: Right;  . Joint replacement    . Eye surgery    . Spine surgery      Social History  Substance Use Topics  . Smoking status: Former Smoker    Types: Pipe  . Smokeless tobacco: Never Used  . Alcohol Use: No    History reviewed. No pertinent family history.  Allergies  Allergen Reactions  . Morphine And Related   . Tetracyclines & Related     "Raw throat"    Medication list has been reviewed and updated.  Current Outpatient Prescriptions on File Prior to Visit  Medication Sig Dispense Refill  . amLODipine (NORVASC) 2.5 MG tablet TAKE 1 TABLET BY MOUTH EVERY DAY 30 tablet 11  . atenolol (TENORMIN) 50 MG tablet TAKE 1 TABLET BY MOUTH DAILY 90 tablet 3  . esomeprazole (NEXIUM) 40 MG capsule Take 40 mg by mouth daily before breakfast.      . fenofibrate 160 MG tablet Take 160 mg by mouth daily.      Marland Kitchen gabapentin (NEURONTIN) 300 MG capsule  Take 300 mg by mouth 2 (two) times daily. Taking Two Capsule BID    . Insulin Glargine (LANTUS [place]) Inject 40 Units into the skin daily.    . Insulin Lispro, Human, (HUMALOG PEN [place]) Inject into the skin. Slide Scale    . isosorbide mononitrate (IMDUR) 30 MG 24 hr tablet Take 1 tablet (30 mg total) by mouth daily. 90 tablet 11  . lisinopril (PRINIVIL,ZESTRIL) 20 MG tablet Take 20 mg by mouth daily.  9  . RANEXA 1000 MG SR tablet TAKE 1 TABLET BY MOUTH TWICE A DAY 60 tablet 11  . simvastatin (ZOCOR) 20 MG tablet TAKE 1 TABLET BY MOUTH AT BEDTIME 30 tablet 6  . traMADol (ULTRAM) 50 MG tablet Take 50 mg by mouth 2 (two) times daily.    Marland Kitchen. aspirin EC 81 MG tablet Take 1 tablet (81 mg total) by mouth daily. (Patient not taking: Reported on 11/07/2015)    . [DISCONTINUED] ferrous sulfate 325 (65 FE) MG tablet Take 1 tablet (325 mg total) by mouth 3 (three) times daily  after meals.     No current facility-administered medications on file prior to visit.    Review of Systems:  As per HPI- otherwise negative.   Physical Examination: Filed Vitals:   11/07/15 1732  BP: 148/60  Pulse: 71  Temp: 98.1 F (36.7 C)  Resp: 16   Filed Vitals:   11/07/15 1732  Height: 5\' 9"  (1.753 m)  Weight: 200 lb (90.719 kg)   Body mass index is 29.52 kg/(m^2). Ideal Body Weight: Weight in (lb) to have BMI = 25: 168.9  GEN: WDWN, NAD, Non-toxic, A & O x 3, well appearing older gentleman here today with his wife HEENT: Atraumatic, Normocephalic. Neck supple. No masses, No LAD. Ears and Nose: No external deformity. CV: RRR, No M/G/R. No JVD. No thrill. No extra heart sounds. PULM: CTA B, no wheezes, crackles, rhonchi. No retractions. No resp. distress. No accessory muscle use. EXTR: No c/c/e NEURO uses a walker for support  PSYCH: Normally interactive. Conversant. Not depressed or anxious appearing.  Calm demeanor.  Left shoulder: he is tender over the distal 3rd of the clavicle. He also has pain with ROM of the shoulder beyond about 45 degrees and does not want me to move his shoulder more than 90 degrees in abduction or flexion Normal strength and sensation of BUE No deformity or evidence of dislocation, no bruise or skin lesion   [facility] reading (PRIMARY) by  Dr. Patsy [place]. Left clavicle:negative Left shoulder: negative  LEFT CLAVICLE - 2+ VIEWS  COMPARISON: None.  FINDINGS: There is no evidence of fracture or other focal bone lesions. Soft tissues are unremarkable.  IMPRESSION: No acute abnormality noted.  LEFT SHOULDER - 2+ VIEW  COMPARISON: None.  FINDINGS: There is no evidence of acute fracture, subluxation or dislocation.  Degenerative changes at the glenohumeral joint noted.  Visualized bony left hemi thorax is unremarkable.  IMPRESSION: No acute bony abnormality.  Assessment and Plan: Left shoulder pain - Plan: DG Shoulder Left, DG  Clavicle Left  Shoulder contusion, left, initial encounter - Plan: DG Shoulder Left, DG Clavicle Left  Here today with a left shoulder contusion following a fall earlier today. His films are negative- he will treat it conservatively and be sure to maintain his ROM. Did not give him a sling because he uses a walker.   He will let us know if not feeling better in the next few days- Sooner if worse.  Signed Lamar Blinks, MD

## 2015-11-11 ENCOUNTER — Inpatient Hospital Stay (HOSPITAL_COMMUNITY)
Admission: EM | Admit: 2015-11-11 | Discharge: 2015-11-24 | DRG: 208 | Disposition: A | Discharge status: Skilled Nursing Facility | Payer: Medicare Other | Attending: Internal Medicine | Admitting: Internal Medicine

## 2015-11-11 ENCOUNTER — Encounter (HOSPITAL_COMMUNITY): Payer: Self-pay | Admitting: Emergency Medicine

## 2015-11-11 ENCOUNTER — Emergency Department (HOSPITAL_COMMUNITY): Payer: Medicare Other

## 2015-11-11 DIAGNOSIS — Z9289 Personal history of other medical treatment: Secondary | ICD-10-CM

## 2015-11-11 DIAGNOSIS — E1149 Type 2 diabetes mellitus with other diabetic neurological complication: Secondary | ICD-10-CM | POA: Diagnosis present

## 2015-11-11 DIAGNOSIS — T884XXA Failed or difficult intubation, initial encounter: Secondary | ICD-10-CM

## 2015-11-11 DIAGNOSIS — R778 Other specified abnormalities of plasma proteins: Secondary | ICD-10-CM | POA: Diagnosis present

## 2015-11-11 DIAGNOSIS — R7989 Other specified abnormal findings of blood chemistry: Secondary | ICD-10-CM

## 2015-11-11 DIAGNOSIS — I10 Essential (primary) hypertension: Secondary | ICD-10-CM

## 2015-11-11 DIAGNOSIS — R4182 Altered mental status, unspecified: Secondary | ICD-10-CM | POA: Diagnosis present

## 2015-11-11 DIAGNOSIS — Z951 Presence of aortocoronary bypass graft: Secondary | ICD-10-CM

## 2015-11-11 DIAGNOSIS — Z87891 Personal history of nicotine dependence: Secondary | ICD-10-CM

## 2015-11-11 DIAGNOSIS — J440 Chronic obstructive pulmonary disease with acute lower respiratory infection: Secondary | ICD-10-CM | POA: Diagnosis present

## 2015-11-11 DIAGNOSIS — R140 Abdominal distension (gaseous): Secondary | ICD-10-CM

## 2015-11-11 DIAGNOSIS — M25551 Pain in right hip: Secondary | ICD-10-CM | POA: Diagnosis not present

## 2015-11-11 DIAGNOSIS — N183 Chronic kidney disease, stage 3 unspecified: Secondary | ICD-10-CM

## 2015-11-11 DIAGNOSIS — Z96641 Presence of right artificial hip joint: Secondary | ICD-10-CM | POA: Diagnosis present

## 2015-11-11 DIAGNOSIS — E785 Hyperlipidemia, unspecified: Secondary | ICD-10-CM | POA: Diagnosis present

## 2015-11-11 DIAGNOSIS — Z955 Presence of coronary angioplasty implant and graft: Secondary | ICD-10-CM

## 2015-11-11 DIAGNOSIS — Z794 Long term (current) use of insulin: Secondary | ICD-10-CM

## 2015-11-11 DIAGNOSIS — R0602 Shortness of breath: Secondary | ICD-10-CM | POA: Diagnosis not present

## 2015-11-11 DIAGNOSIS — T502X5A Adverse effect of carbonic-anhydrase inhibitors, benzothiadiazides and other diuretics, initial encounter: Secondary | ICD-10-CM | POA: Diagnosis not present

## 2015-11-11 DIAGNOSIS — I5033 Acute on chronic diastolic (congestive) heart failure: Secondary | ICD-10-CM | POA: Diagnosis present

## 2015-11-11 DIAGNOSIS — R52 Pain, unspecified: Secondary | ICD-10-CM

## 2015-11-11 DIAGNOSIS — L8990 Pressure ulcer of unspecified site, unspecified stage: Secondary | ICD-10-CM

## 2015-11-11 DIAGNOSIS — E1140 Type 2 diabetes mellitus with diabetic neuropathy, unspecified: Secondary | ICD-10-CM

## 2015-11-11 DIAGNOSIS — K219 Gastro-esophageal reflux disease without esophagitis: Secondary | ICD-10-CM | POA: Diagnosis present

## 2015-11-11 DIAGNOSIS — I214 Non-ST elevation (NSTEMI) myocardial infarction: Secondary | ICD-10-CM | POA: Diagnosis not present

## 2015-11-11 DIAGNOSIS — E1122 Type 2 diabetes mellitus with diabetic chronic kidney disease: Secondary | ICD-10-CM | POA: Diagnosis present

## 2015-11-11 DIAGNOSIS — I2510 Atherosclerotic heart disease of native coronary artery without angina pectoris: Secondary | ICD-10-CM | POA: Diagnosis present

## 2015-11-11 DIAGNOSIS — I509 Heart failure, unspecified: Secondary | ICD-10-CM

## 2015-11-11 DIAGNOSIS — I130 Hypertensive heart and chronic kidney disease with heart failure and stage 1 through stage 4 chronic kidney disease, or unspecified chronic kidney disease: Secondary | ICD-10-CM | POA: Diagnosis present

## 2015-11-11 DIAGNOSIS — R410 Disorientation, unspecified: Secondary | ICD-10-CM

## 2015-11-11 DIAGNOSIS — G3184 Mild cognitive impairment, so stated: Secondary | ICD-10-CM

## 2015-11-11 DIAGNOSIS — E1143 Type 2 diabetes mellitus with diabetic autonomic (poly)neuropathy: Secondary | ICD-10-CM | POA: Diagnosis present

## 2015-11-11 DIAGNOSIS — J189 Pneumonia, unspecified organism: Principal | ICD-10-CM | POA: Diagnosis present

## 2015-11-11 DIAGNOSIS — I252 Old myocardial infarction: Secondary | ICD-10-CM

## 2015-11-11 DIAGNOSIS — J9601 Acute respiratory failure with hypoxia: Secondary | ICD-10-CM | POA: Diagnosis not present

## 2015-11-11 DIAGNOSIS — I5041 Acute combined systolic (congestive) and diastolic (congestive) heart failure: Secondary | ICD-10-CM

## 2015-11-11 DIAGNOSIS — I25810 Atherosclerosis of coronary artery bypass graft(s) without angina pectoris: Secondary | ICD-10-CM | POA: Diagnosis present

## 2015-11-11 DIAGNOSIS — Z4659 Encounter for fitting and adjustment of other gastrointestinal appliance and device: Secondary | ICD-10-CM

## 2015-11-11 HISTORY — DX: Non-ST elevation (NSTEMI) myocardial infarction: I21.4

## 2015-11-11 LAB — CBC WITH DIFFERENTIAL/PLATELET
BASOS ABS: 0 10*3/uL (ref 0.0–0.1)
Basophils Relative: 0 %
EOS ABS: 0.1 10*3/uL (ref 0.0–0.7)
EOS PCT: 1 %
HCT: 38.5 % — ABNORMAL LOW (ref 39.0–52.0)
HEMOGLOBIN: 12.6 g/dL — AB (ref 13.0–17.0)
LYMPHS ABS: 2.4 10*3/uL (ref 0.7–4.0)
LYMPHS PCT: 24 %
MCH: 33.7 pg (ref 26.0–34.0)
MCHC: 32.7 g/dL (ref 30.0–36.0)
MCV: 102.9 fL — ABNORMAL HIGH (ref 78.0–100.0)
Monocytes Absolute: 0.8 10*3/uL (ref 0.1–1.0)
Monocytes Relative: 8 %
NEUTROS PCT: 67 %
Neutro Abs: 6.9 10*3/uL (ref 1.7–7.7)
PLATELETS: 247 10*3/uL (ref 150–400)
RBC: 3.74 MIL/uL — AB (ref 4.22–5.81)
RDW: 14.2 % (ref 11.5–15.5)
WBC: 10.3 10*3/uL (ref 4.0–10.5)

## 2015-11-11 LAB — COMPREHENSIVE METABOLIC PANEL
ALT: 14 U/L — AB (ref 17–63)
AST: 27 U/L (ref 15–41)
Albumin: 4 g/dL (ref 3.5–5.0)
Alkaline Phosphatase: 42 U/L (ref 38–126)
Anion gap: 9 (ref 5–15)
BUN: 35 mg/dL — ABNORMAL HIGH (ref 6–20)
CHLORIDE: 105 mmol/L (ref 101–111)
CO2: 28 mmol/L (ref 22–32)
CREATININE: 1.34 mg/dL — AB (ref 0.61–1.24)
Calcium: 9.6 mg/dL (ref 8.9–10.3)
GFR, EST AFRICAN AMERICAN: 54 mL/min — AB (ref >60)
GFR, EST NON AFRICAN AMERICAN: 47 mL/min — AB (ref >60)
Glucose, Bld: 253 mg/dL — ABNORMAL HIGH (ref 65–99)
POTASSIUM: 4.3 mmol/L (ref 3.5–5.1)
SODIUM: 142 mmol/L (ref 135–145)
TOTAL PROTEIN: 7.1 g/dL (ref 6.5–8.1)
Total Bilirubin: 0.7 mg/dL (ref 0.3–1.2)

## 2015-11-11 LAB — BRAIN NATRIURETIC PEPTIDE: B NATRIURETIC PEPTIDE 5: 414.8 pg/mL — AB (ref 0.0–100.0)

## 2015-11-11 LAB — TROPONIN I: TROPONIN I: 2.2 ng/mL — AB (ref <0.031)

## 2015-11-11 LAB — I-STAT CG4 LACTIC ACID, ED: LACTIC ACID, VENOUS: 1.47 mmol/L (ref 0.5–2.0)

## 2015-11-11 MED ORDER — FUROSEMIDE 10 MG/ML IJ SOLN
40.0000 mg | INTRAMUSCULAR | Status: AC
  Administered 2015-11-11: 40 mg via INTRAVENOUS
  Filled 2015-11-11: qty 4

## 2015-11-11 MED ORDER — LEVOFLOXACIN 750 MG PO TABS
750.0000 mg | ORAL_TABLET | Freq: Once | ORAL | Status: AC
  Administered 2015-11-11: 750 mg via ORAL
  Filled 2015-11-11: qty 1

## 2015-11-11 MED ORDER — ASPIRIN 81 MG PO CHEW
324.0000 mg | CHEWABLE_TABLET | Freq: Once | ORAL | Status: AC
  Administered 2015-11-11: 324 mg via ORAL
  Filled 2015-11-11: qty 4

## 2015-11-11 NOTE — ED Notes (Signed)
Critical troponin 2.20 Crystal from lab.

## 2015-11-11 NOTE — ED Provider Notes (Signed)
CSN: [id number]     Arrival date & time 11/11/15  2102 History   First MD Initiated Contact with Patient 11/11/15 2216     Chief Complaint  Patient presents with  . Shortness of Breath  . Cough  . Fever  . Altered Mental Status     (Consider location/radiation/quality/duration/timing/severity/associated sxs/prior Treatment) HPI Comments: 81 year old male with past medical history including hypertension, hyperlipidemia, type 2 diabetes mellitus, CAD status post CABG who presents with shortness of breath. History obtained with the assistance of the patient's wife. Wife reports that the patient was recently diagnosed with COPD and started on home oxygen yesterday at 2 L nasal cannula. He started having a new cough today and this afternoon developed a fever at 100. Wife called his PCP who gave him a prescription for [facility]; he has taken one dose. She reports that his shortness of breath became worse this evening. He has had one month of progressive shortness of breath on exertion. He endorses mild bilateral lower semi-edema, orthopnea, and paroxysmal nocturnal dyspnea. He states that he has noticed a weight increase recently but is not sure how much. He denies any vomiting, diarrhea, or abdominal pain. He states that he has had chest pain when he is coughing but denies any chest pain at rest.  Patient received albuterol nebulizer, Atrovent, and Solu-Medrol by EMS in transport.  Patient is a 81 y.o. male presenting with shortness of breath, cough, fever, and altered mental status. The history is provided by the patient and the spouse.  Shortness of Breath Associated symptoms: cough and fever   Cough Associated symptoms: fever and shortness of breath   Fever Associated symptoms: cough   Altered Mental Status Associated symptoms: fever     Past Medical History  Diagnosis Date  . Hypertension   . Diabetes mellitus   . Dyslipidemia   . Coronary artery disease     status post CABG. He is  also status post PTCA and stenting of the circumflex artery  . Heart murmur   . Renal calculi   . 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 Cataract   . Myocardial infarction [facility])    Past Surgical History  Procedure Laterality Date  . Lithotripsy    . US echocardiography  01-16-09    EF 55-60  . Cardiovascular stress test  09-26-03    EF 43%  . Cardiac catheterization    . Coronary artery bypass graft      CABG X 4  . Coronary angioplasty with stent placement    . Tonsillectomy    . Cataract extraction w/ intraocular lens implant      right  . Back surgery      x6  . Total hip arthroplasty  10/29/2011    Procedure: TOTAL HIP ARTHROPLASTY;  Surgeon: Shelda Pal;  Location: [facility];  Service: Orthopedics;  Laterality: Right;  . Joint replacement    . Eye surgery    . Spine surgery     History reviewed. No pertinent family history. Social History  Substance Use Topics  . Smoking status: Former Smoker    Types: Pipe  . Smokeless tobacco: Never Used  . Alcohol Use: No    Review of Systems  Constitutional: Positive for fever.  Respiratory: Positive for cough and shortness of breath.    10 Systems reviewed and are negative for acute change except as noted in the HPI.    Allergies  Morphine and related and Tetracyclines &  related  Home Medications   Prior to Admission medications   Medication Sig Start Date End Date Taking? Authorizing Provider  amLODipine (NORVASC) 2.5 MG tablet TAKE 1 TABLET BY MOUTH EVERY DAY 09/08/15  Yes Vesta MixerPhilip J Nahser, MD  atenolol (TENORMIN) 50 MG tablet TAKE 1 TABLET BY MOUTH DAILY 06/05/15  Yes Vesta MixerPhilip J Nahser, MD  cefdinir (OMNICEF) 300 MG capsule Take 1 capsule by mouth 2 (two) times daily. For 10 days 11/11/15  Yes Historical Provider, MD  esomeprazole (NEXIUM) 40 MG capsule Take 40 mg by mouth daily before breakfast.     Yes Historical Provider, MD  fenofibrate 160 MG tablet Take 160 mg by mouth daily.     Yes  Historical Provider, MD  furosemide (LASIX) 20 MG tablet Take 0.5 tablets by mouth daily. 10/30/15  Yes Historical Provider, MD  gabapentin (NEURONTIN) 300 MG capsule Take 300 mg by mouth 2 (two) times daily.    Yes Historical Provider, MD  Insulin Glargine (LANTUS [place]) Inject 40 Units into the skin at bedtime.    Yes Historical Provider, MD  Insulin Lispro, Human, (HUMALOG PEN [place]) Inject into the skin. Slide Scale   Yes Historical Provider, MD  isosorbide mononitrate (IMDUR) 30 MG 24 hr tablet Take 1 tablet (30 mg total) by mouth daily. 11/28/14  Yes Vesta MixerPhilip J Nahser, MD  lisinopril (PRINIVIL,ZESTRIL) 20 MG tablet Take 20 mg by mouth daily. 10/29/14  Yes Historical Provider, MD  mometasone (ELOCON) 0.1 % cream Apply 1 application topically 2 (two) times daily. Applies to nose 10/24/15  Yes Historical Provider, MD  RANEXA 1000 MG SR tablet TAKE 1 TABLET BY MOUTH TWICE A DAY 08/12/15  Yes Vesta MixerPhilip J Nahser, MD  simvastatin (ZOCOR) 20 MG tablet TAKE 1 TABLET BY MOUTH AT BEDTIME 07/29/15  Yes Vesta MixerPhilip J Nahser, MD  traMADol (ULTRAM) 50 MG tablet Take 50 mg by mouth 2 (two) times daily.   Yes Historical Provider, MD  aspirin EC 81 MG tablet Take 1 tablet (81 mg total) by mouth daily. Patient not taking: Reported on 11/07/2015 07/30/14   Deloris PingPhilip J Nahser, MD   BP 163/63 mmHg  Pulse 84  Temp(Src) 100 F (37.8 C) (Rectal)  Resp 22  Ht 5\' 9"  (1.753 m)  Wt 200 lb (90.719 kg)  BMI 29.52 kg/m2  SpO2 94% Physical Exam  Constitutional: He is oriented to person, place, and time. He appears well-developed and well-nourished. No distress.  HENT:  Head: Normocephalic and atraumatic.  Moist mucous membranes  Eyes: Conjunctivae are normal. Pupils are equal, round, and reactive to light.  Neck: Neck supple.  Cardiovascular: Normal rate, regular rhythm and normal heart sounds.   2/6 systolic murmur  Pulmonary/Chest:  Mildly increased work of breathing with diminished breath sounds and faint crackles in bilateral bases   Abdominal: Soft. Bowel sounds are normal. He exhibits no distension. There is no tenderness.  Musculoskeletal:  1+ pitting edema b/l LE  Neurological: He is alert and oriented to person, place, and time.  Fluent speech  Skin: Skin is warm and dry.  Psychiatric: He has a normal mood and affect. Judgment normal.  Nursing note and vitals reviewed.   ED Course  .Critical Care Performed by: Laurence SpatesLITTLE, Saja Bartolini MORGAN Authorized by: Laurence SpatesLITTLE, Davonta Stroot MORGAN Total critical care time: 30 minutes Critical care time was exclusive of separately billable procedures and treating other patients. Critical care was necessary to treat or prevent imminent or life-threatening deterioration of the following conditions: cardiac failure and respiratory failure. Critical care  was time spent personally by me on the following activities: development of treatment plan with patient or surrogate, evaluation of patient's response to treatment, examination of patient, obtaining history from patient or surrogate, ordering and performing treatments and interventions, ordering and review of laboratory studies, ordering and review of radiographic studies, pulse oximetry, re-evaluation of patient's condition and review of old charts.   (including critical care time) Labs Review Labs Reviewed  COMPREHENSIVE METABOLIC PANEL - Abnormal; Notable for the following:    Glucose, Bld 253 (*)    BUN 35 (*)    Creatinine, Ser 1.34 (*)    ALT 14 (*)    GFR calc non Af Amer 47 (*)    GFR calc Af Amer 54 (*)    All other components within normal limits  CBC WITH DIFFERENTIAL/PLATELET - Abnormal; Notable for the following:    RBC 3.74 (*)    Hemoglobin 12.6 (*)    HCT 38.5 (*)    MCV 102.9 (*)    All other components within normal limits  TROPONIN I - Abnormal; Notable for the following:    Troponin I 2.20 (*)    All other components within normal limits  CULTURE, BLOOD (ROUTINE X 2)  CULTURE, BLOOD (ROUTINE X 2)  URINE CULTURE   URINALYSIS, ROUTINE W REFLEX MICROSCOPIC (NOT AT [facility])  BRAIN NATRIURETIC PEPTIDE  I-STAT CG4 LACTIC ACID, ED    Imaging Review Dg Chest Port 1 View  11/11/2015  CLINICAL DATA:  Fever and wheezing EXAM: PORTABLE CHEST - 1 VIEW COMPARISON:  10/28/2011 FINDINGS: Cardiac shadow is again enlarged. Postsurgical changes are again seen. Elevation the right hemidiaphragm is noted. Vascular congestion with patchy edematous changes are seen this likely represents CHF although the possibility of superimposed infiltrate deserves consideration as well. IMPRESSION: Increased vascular congestion and patchy changes likely related to CHF. Electronically Signed   By: Alcide Clever M.D.   On: 11/11/2015 21:50   I have personally reviewed and evaluated these lab results as part of my medical decision-making.   EKG Interpretation   Date/Time:  Tuesday November 11 2015 21:08:09 EST Ventricular Rate:  90 PR Interval:  211 QRS Duration: 97 QT Interval:  356 QTC Calculation: 436 R Axis:   -73 Text Interpretation:  Sinus rhythm Borderline repolarization abnormality  Confirmed by Donnald Garre, MD, Lebron Conners [phone]) on 11/11/2015 9:36:09 PM      MDM   Final diagnoses:  Acute congestive heart failure, unspecified congestive heart failure type (HCC)  NSTEMI (non-ST elevated myocardial infarction) [facility])   Patient presents by EMS from home with worsening shortness of breath in the setting of recent COPD diagnosis with new oxygen requirement at home. In ED, patient was awake, alert, mildly dyspneic but in no acute distress. O2 sat 94% on 4 L. He had diminished breath sounds bilaterally with crackles in bases but no significant wheezing. No complaints of chest pain. EKG without acute ischemic changes. Obtained chest x-ray and above labs. Chest x-ray showed vascular congestion suggestive of CHF versus superimposed infiltrate. Labs notable for creatinine 1.34, normal lactate, troponin 2.2, BNP 414. Because the patient's  reported fever and cough, I gave him Levaquin as he has not had any recent hospitalization. Also gave him aspirin and  IV lasix as I suspect volume overload. I ordered heparin drip per ACS protocol given elevated troponin. Discussed admission with [facility] hospitalist, Dr. Maryfrances Bunnell, who has accepted admission. Appreciate assistance w/ patient's care. Consulted cardiology and spoke with Dr. Rennis Golden; he has recommended NPO after  MN in anticipation of cath once stabilized and has requested to be contacted if pt develops CP. I have notified Dr. Maryfrances Bunnell of this plan and he will be in touch with cardiology. Patient admitted for further treatment.  Laurence Spates, MD 11/12/15 [phone]

## 2015-11-11 NOTE — ED Notes (Signed)
Bed: [id number] Expected date:  Expected time:  Means of arrival:  Comments: EMS 81 yo male altered mental status-fever 101/wheezing/neb given

## 2015-11-11 NOTE — ED Notes (Signed)
Pt alert and oriented to person unable to verbalize place, date or situation. Pt in no acute distress, skin warm and dry. Spouse at bedside.

## 2015-11-11 NOTE — ED Notes (Addendum)
Pt from home with wife.  Recently Dx with COPD.  Placed on home O2 yesterday. 2L via [place].  Started [employer] tonight (has had one dose).  Wheeze on arrival of EMS.  Gave neb 5mg  albuterol Tx.  Pt has stong urine smell.  2nd ned Tx of 5mg  albuterol with .5mg  atrovent.  125mg  solumedrol given IV also.  BP 170/80 P 90's  CBG 305  O2 low 90s on O2.  With nebs 94-95%

## 2015-11-11 NOTE — ED Notes (Signed)
Pt & wife aware of need for urine sample.  Pts wife emptied urinal.

## 2015-11-12 ENCOUNTER — Encounter (HOSPITAL_COMMUNITY): Payer: Self-pay | Admitting: Family Medicine

## 2015-11-12 ENCOUNTER — Inpatient Hospital Stay (HOSPITAL_COMMUNITY): Payer: Medicare Other

## 2015-11-12 DIAGNOSIS — N183 Chronic kidney disease, stage 3 unspecified: Secondary | ICD-10-CM

## 2015-11-12 DIAGNOSIS — Z87891 Personal history of nicotine dependence: Secondary | ICD-10-CM | POA: Diagnosis not present

## 2015-11-12 DIAGNOSIS — K219 Gastro-esophageal reflux disease without esophagitis: Secondary | ICD-10-CM | POA: Diagnosis present

## 2015-11-12 DIAGNOSIS — N179 Acute kidney failure, unspecified: Secondary | ICD-10-CM | POA: Diagnosis not present

## 2015-11-12 DIAGNOSIS — Z951 Presence of aortocoronary bypass graft: Secondary | ICD-10-CM | POA: Diagnosis not present

## 2015-11-12 DIAGNOSIS — I5033 Acute on chronic diastolic (congestive) heart failure: Secondary | ICD-10-CM | POA: Diagnosis not present

## 2015-11-12 DIAGNOSIS — T502X5A Adverse effect of carbonic-anhydrase inhibitors, benzothiadiazides and other diuretics, initial encounter: Secondary | ICD-10-CM | POA: Diagnosis not present

## 2015-11-12 DIAGNOSIS — I509 Heart failure, unspecified: Secondary | ICD-10-CM | POA: Insufficient documentation

## 2015-11-12 DIAGNOSIS — I5032 Chronic diastolic (congestive) heart failure: Secondary | ICD-10-CM | POA: Diagnosis not present

## 2015-11-12 DIAGNOSIS — E1143 Type 2 diabetes mellitus with diabetic autonomic (poly)neuropathy: Secondary | ICD-10-CM | POA: Diagnosis present

## 2015-11-12 DIAGNOSIS — I252 Old myocardial infarction: Secondary | ICD-10-CM | POA: Diagnosis not present

## 2015-11-12 DIAGNOSIS — R410 Disorientation, unspecified: Secondary | ICD-10-CM

## 2015-11-12 DIAGNOSIS — J189 Pneumonia, unspecified organism: Secondary | ICD-10-CM | POA: Diagnosis not present

## 2015-11-12 DIAGNOSIS — I214 Non-ST elevation (NSTEMI) myocardial infarction: Secondary | ICD-10-CM | POA: Diagnosis not present

## 2015-11-12 DIAGNOSIS — I10 Essential (primary) hypertension: Secondary | ICD-10-CM

## 2015-11-12 DIAGNOSIS — R0602 Shortness of breath: Secondary | ICD-10-CM | POA: Diagnosis present

## 2015-11-12 DIAGNOSIS — A419 Sepsis, unspecified organism: Secondary | ICD-10-CM | POA: Diagnosis not present

## 2015-11-12 DIAGNOSIS — R4182 Altered mental status, unspecified: Secondary | ICD-10-CM | POA: Diagnosis present

## 2015-11-12 DIAGNOSIS — Z955 Presence of coronary angioplasty implant and graft: Secondary | ICD-10-CM | POA: Diagnosis not present

## 2015-11-12 DIAGNOSIS — I2510 Atherosclerotic heart disease of native coronary artery without angina pectoris: Secondary | ICD-10-CM | POA: Diagnosis present

## 2015-11-12 DIAGNOSIS — I25810 Atherosclerosis of coronary artery bypass graft(s) without angina pectoris: Secondary | ICD-10-CM | POA: Diagnosis present

## 2015-11-12 DIAGNOSIS — M25551 Pain in right hip: Secondary | ICD-10-CM | POA: Diagnosis not present

## 2015-11-12 DIAGNOSIS — I130 Hypertensive heart and chronic kidney disease with heart failure and stage 1 through stage 4 chronic kidney disease, or unspecified chronic kidney disease: Secondary | ICD-10-CM | POA: Diagnosis present

## 2015-11-12 DIAGNOSIS — R778 Other specified abnormalities of plasma proteins: Secondary | ICD-10-CM | POA: Diagnosis present

## 2015-11-12 DIAGNOSIS — J440 Chronic obstructive pulmonary disease with acute lower respiratory infection: Secondary | ICD-10-CM | POA: Diagnosis present

## 2015-11-12 DIAGNOSIS — E1140 Type 2 diabetes mellitus with diabetic neuropathy, unspecified: Secondary | ICD-10-CM | POA: Diagnosis not present

## 2015-11-12 DIAGNOSIS — E785 Hyperlipidemia, unspecified: Secondary | ICD-10-CM | POA: Diagnosis present

## 2015-11-12 DIAGNOSIS — Z794 Long term (current) use of insulin: Secondary | ICD-10-CM | POA: Diagnosis not present

## 2015-11-12 DIAGNOSIS — E1122 Type 2 diabetes mellitus with diabetic chronic kidney disease: Secondary | ICD-10-CM | POA: Diagnosis present

## 2015-11-12 DIAGNOSIS — J9601 Acute respiratory failure with hypoxia: Secondary | ICD-10-CM | POA: Diagnosis not present

## 2015-11-12 DIAGNOSIS — I5041 Acute combined systolic (congestive) and diastolic (congestive) heart failure: Secondary | ICD-10-CM | POA: Diagnosis not present

## 2015-11-12 DIAGNOSIS — R7989 Other specified abnormal findings of blood chemistry: Secondary | ICD-10-CM

## 2015-11-12 DIAGNOSIS — Z96641 Presence of right artificial hip joint: Secondary | ICD-10-CM | POA: Diagnosis present

## 2015-11-12 HISTORY — DX: Non-ST elevation (NSTEMI) myocardial infarction: I21.4

## 2015-11-12 LAB — GLUCOSE, CAPILLARY
GLUCOSE-CAPILLARY: 178 mg/dL — AB (ref 65–99)
GLUCOSE-CAPILLARY: 288 mg/dL — AB (ref 65–99)
GLUCOSE-CAPILLARY: 190 mg/dL — AB (ref 65–99)
Glucose-Capillary: 297 mg/dL — ABNORMAL HIGH (ref 65–99)
Glucose-Capillary: 245 mg/dL — ABNORMAL HIGH (ref 65–99)
Glucose-Capillary: 247 mg/dL — ABNORMAL HIGH (ref 65–99)

## 2015-11-12 LAB — MRSA PCR SCREENING: MRSA BY PCR: NEGATIVE

## 2015-11-12 LAB — URINE MICROSCOPIC-ADD ON

## 2015-11-12 LAB — CREATININE, SERUM
Creatinine, Ser: 1.39 mg/dL — ABNORMAL HIGH (ref 0.61–1.24)
GFR, EST AFRICAN AMERICAN: 52 mL/min — AB (ref >60)
GFR, EST NON AFRICAN AMERICAN: 45 mL/min — AB (ref >60)

## 2015-11-12 LAB — URINALYSIS, ROUTINE W REFLEX MICROSCOPIC
Bilirubin Urine: NEGATIVE
GLUCOSE, UA: 500 mg/dL — AB
KETONES UR: NEGATIVE mg/dL
LEUKOCYTES UA: NEGATIVE
Nitrite: NEGATIVE
PROTEIN: NEGATIVE mg/dL
Specific Gravity, Urine: 1.018 (ref 1.005–1.030)
pH: 6 (ref 5.0–8.0)

## 2015-11-12 LAB — TROPONIN I
TROPONIN I: 18.18 ng/mL — AB (ref <0.031)
TROPONIN I: 25.58 ng/mL — AB (ref <0.031)
Troponin I: 25.52 ng/mL (ref <0.031)

## 2015-11-12 LAB — INFLUENZA PANEL BY PCR (TYPE A & B)
H1N1 flu by pcr: NOT DETECTED
INFLAPCR: NEGATIVE
Influenza B By PCR: NEGATIVE

## 2015-11-12 LAB — PROTIME-INR
INR: 1.15 (ref 0.00–1.49)
Prothrombin Time: 14.8 seconds (ref 11.6–15.2)

## 2015-11-12 LAB — CBC
HEMATOCRIT: 35.5 % — AB (ref 39.0–52.0)
Hemoglobin: 11.9 g/dL — ABNORMAL LOW (ref 13.0–17.0)
MCH: 34.3 pg — ABNORMAL HIGH (ref 26.0–34.0)
MCHC: 33.5 g/dL (ref 30.0–36.0)
MCV: 102.3 fL — AB (ref 78.0–100.0)
PLATELETS: 230 10*3/uL (ref 150–400)
RBC: 3.47 MIL/uL — ABNORMAL LOW (ref 4.22–5.81)
RDW: 14.1 % (ref 11.5–15.5)
WBC: 7.5 10*3/uL (ref 4.0–10.5)

## 2015-11-12 LAB — HEPARIN LEVEL (UNFRACTIONATED)
Heparin Unfractionated: 0.37 IU/mL (ref 0.30–0.70)
Heparin Unfractionated: 0.29 IU/mL — ABNORMAL LOW (ref 0.30–0.70)

## 2015-11-12 LAB — PROCALCITONIN

## 2015-11-12 LAB — CBG MONITORING, ED: GLUCOSE-CAPILLARY: 220 mg/dL — AB (ref 65–99)

## 2015-11-12 LAB — APTT: APTT: 30 s (ref 24–37)

## 2015-11-12 MED ORDER — SIMVASTATIN 10 MG PO TABS: 20.0000 mg | ORAL_TABLET | Freq: Every day | ORAL | Status: DC

## 2015-11-12 MED ORDER — FUROSEMIDE 20 MG PO TABS
10.0000 mg | ORAL_TABLET | Freq: Every day | ORAL | Status: DC
  Administered 2015-11-12 – 2015-11-13 (×2): 10 mg via ORAL
  Filled 2015-11-12 (×2): qty 1

## 2015-11-12 MED ORDER — LEVOFLOXACIN IN D5W 750 MG/150ML IV SOLN
750.0000 mg | INTRAVENOUS | Status: DC
  Filled 2015-11-12: qty 150

## 2015-11-12 MED ORDER — SODIUM CHLORIDE 0.9 % IJ SOLN
3.0000 mL | Freq: Two times a day (BID) | INTRAMUSCULAR | Status: DC
  Administered 2015-11-12 – 2015-11-24 (×17): 3 mL via INTRAVENOUS

## 2015-11-12 MED ORDER — SODIUM CHLORIDE 0.9 % IV SOLN
250.0000 mL | INTRAVENOUS | Status: DC | PRN
Start: 2015-11-12 — End: 2015-11-13

## 2015-11-12 MED ORDER — LISINOPRIL 20 MG PO TABS
20.0000 mg | ORAL_TABLET | Freq: Every day | ORAL | Status: DC
  Administered 2015-11-12 – 2015-11-15 (×4): 20 mg via ORAL
  Filled 2015-11-12 (×3): qty 1
  Filled 2015-11-12: qty 2

## 2015-11-12 MED ORDER — PERFLUTREN LIPID MICROSPHERE
INTRAVENOUS | Status: AC
  Filled 2015-11-12: qty 10

## 2015-11-12 MED ORDER — GABAPENTIN 300 MG PO CAPS
300.0000 mg | ORAL_CAPSULE | Freq: Two times a day (BID) | ORAL | Status: DC
  Administered 2015-11-12 – 2015-11-24 (×26): 300 mg via ORAL
  Filled 2015-11-12 (×26): qty 1

## 2015-11-12 MED ORDER — SODIUM CHLORIDE 0.9 % IJ SOLN
3.0000 mL | Freq: Two times a day (BID) | INTRAMUSCULAR | Status: DC
  Administered 2015-11-12: 3 mL via INTRAVENOUS

## 2015-11-12 MED ORDER — ATORVASTATIN CALCIUM 80 MG PO TABS
80.0000 mg | ORAL_TABLET | Freq: Every day | ORAL | Status: DC
  Administered 2015-11-12 – 2015-11-14 (×3): 80 mg via ORAL
  Filled 2015-11-12 (×3): qty 1

## 2015-11-12 MED ORDER — HEPARIN BOLUS VIA INFUSION
4000.0000 [IU] | Freq: Once | INTRAVENOUS | Status: AC
  Administered 2015-11-12: 4000 [IU] via INTRAVENOUS
  Filled 2015-11-12: qty 4000

## 2015-11-12 MED ORDER — HEPARIN BOLUS VIA INFUSION
1000.0000 [IU] | Freq: Once | INTRAVENOUS | Status: AC
  Administered 2015-11-12: 1000 [IU] via INTRAVENOUS
  Filled 2015-11-12: qty 1000

## 2015-11-12 MED ORDER — RANOLAZINE ER 500 MG PO TB12
1000.0000 mg | ORAL_TABLET | Freq: Two times a day (BID) | ORAL | Status: DC
  Administered 2015-11-12 – 2015-11-24 (×25): 1000 mg via ORAL
  Filled 2015-11-12 (×29): qty 2

## 2015-11-12 MED ORDER — ISOSORBIDE MONONITRATE ER 60 MG PO TB24: 30.0000 mg | ORAL_TABLET | Freq: Every day | ORAL | Status: DC

## 2015-11-12 MED ORDER — INSULIN ASPART 100 UNIT/ML ~~LOC~~ SOLN
0.0000 [IU] | SUBCUTANEOUS | Status: DC
  Administered 2015-11-12: 5 [IU] via SUBCUTANEOUS
  Administered 2015-11-12 (×2): 8 [IU] via SUBCUTANEOUS
  Administered 2015-11-12 – 2015-11-13 (×3): 3 [IU] via SUBCUTANEOUS
  Administered 2015-11-13: 2 [IU] via SUBCUTANEOUS
  Administered 2015-11-13 (×2): 3 [IU] via SUBCUTANEOUS
  Administered 2015-11-13 (×2): 2 [IU] via SUBCUTANEOUS
  Administered 2015-11-14 (×2): 3 [IU] via SUBCUTANEOUS
  Administered 2015-11-14 (×2): 5 [IU] via SUBCUTANEOUS
  Administered 2015-11-15: 2 [IU] via SUBCUTANEOUS

## 2015-11-12 MED ORDER — FENOFIBRATE 160 MG PO TABS
160.0000 mg | ORAL_TABLET | Freq: Every day | ORAL | Status: DC
  Administered 2015-11-12: 160 mg via ORAL
  Filled 2015-11-12: qty 1

## 2015-11-12 MED ORDER — PANTOPRAZOLE SODIUM 40 MG PO TBEC
40.0000 mg | DELAYED_RELEASE_TABLET | Freq: Every day | ORAL | Status: DC
  Administered 2015-11-12 – 2015-11-15 (×4): 40 mg via ORAL
  Filled 2015-11-12 (×4): qty 1

## 2015-11-12 MED ORDER — NITROGLYCERIN 0.4 MG SL SUBL: 0.4000 mg | SUBLINGUAL_TABLET | SUBLINGUAL | Status: DC | PRN

## 2015-11-12 MED ORDER — ASPIRIN EC 81 MG PO TBEC
81.0000 mg | DELAYED_RELEASE_TABLET | Freq: Every day | ORAL | Status: DC
  Administered 2015-11-12 – 2015-11-15 (×3): 81 mg via ORAL
  Filled 2015-11-12 (×3): qty 1

## 2015-11-12 MED ORDER — PERFLUTREN LIPID MICROSPHERE
1.0000 mL | INTRAVENOUS | Status: AC | PRN
  Administered 2015-11-12: 2 mL via INTRAVENOUS
  Filled 2015-11-12: qty 10

## 2015-11-12 MED ORDER — ASPIRIN 81 MG PO CHEW
81.0000 mg | CHEWABLE_TABLET | ORAL | Status: AC
  Administered 2015-11-13: 81 mg via ORAL
  Filled 2015-11-12: qty 1

## 2015-11-12 MED ORDER — INSULIN GLARGINE 100 UNIT/ML ~~LOC~~ SOLN
20.0000 [IU] | Freq: Every day | SUBCUTANEOUS | Status: DC
  Administered 2015-11-12 – 2015-11-23 (×12): 20 [IU] via SUBCUTANEOUS
  Filled 2015-11-12 (×16): qty 0.2

## 2015-11-12 MED ORDER — AMLODIPINE BESYLATE 2.5 MG PO TABS
2.5000 mg | ORAL_TABLET | Freq: Every day | ORAL | Status: DC
  Administered 2015-11-12 – 2015-11-15 (×4): 2.5 mg via ORAL
  Filled 2015-11-12 (×4): qty 1

## 2015-11-12 MED ORDER — CETYLPYRIDINIUM CHLORIDE 0.05 % MT LIQD
7.0000 mL | Freq: Two times a day (BID) | OROMUCOSAL | Status: DC
  Administered 2015-11-12 – 2015-11-17 (×10): 7 mL via OROMUCOSAL

## 2015-11-12 MED ORDER — ATENOLOL 50 MG PO TABS
50.0000 mg | ORAL_TABLET | Freq: Every day | ORAL | Status: DC
  Administered 2015-11-12 – 2015-11-15 (×4): 50 mg via ORAL
  Filled 2015-11-12: qty 1
  Filled 2015-11-12: qty 2
  Filled 2015-11-12 (×2): qty 1

## 2015-11-12 MED ORDER — LEVOFLOXACIN IN D5W 750 MG/150ML IV SOLN: 750.0000 mg | INTRAVENOUS | Status: DC

## 2015-11-12 MED ORDER — ISOSORBIDE MONONITRATE ER 30 MG PO TB24
30.0000 mg | ORAL_TABLET | Freq: Every day | ORAL | Status: DC
  Administered 2015-11-12 – 2015-11-24 (×13): 30 mg via ORAL
  Filled 2015-11-12 (×14): qty 1

## 2015-11-12 MED ORDER — SODIUM CHLORIDE 0.9 % IV BOLUS (SEPSIS): 1000.0000 mL | Freq: Once | INTRAVENOUS | Status: DC

## 2015-11-12 MED ORDER — BENZONATATE 100 MG PO CAPS
100.0000 mg | ORAL_CAPSULE | Freq: Once | ORAL | Status: AC
  Administered 2015-11-12: 100 mg via ORAL
  Filled 2015-11-12: qty 1

## 2015-11-12 MED ORDER — HEPARIN (PORCINE) IN NACL 100-0.45 UNIT/ML-% IJ SOLN
1200.0000 [IU]/h | INTRAMUSCULAR | Status: DC
  Administered 2015-11-12: 900 [IU]/h via INTRAVENOUS
  Administered 2015-11-12: 1050 [IU]/h via INTRAVENOUS
  Filled 2015-11-12 (×3): qty 250

## 2015-11-12 MED ORDER — SODIUM CHLORIDE 0.9 % IV SOLN
INTRAVENOUS | Status: DC
  Administered 2015-11-13: 06:00:00 via INTRAVENOUS

## 2015-11-12 MED ORDER — SODIUM CHLORIDE 0.9 % IJ SOLN
3.0000 mL | INTRAMUSCULAR | Status: DC | PRN
  Administered 2015-11-12: 3 mL via INTRAVENOUS
  Filled 2015-11-12: qty 3

## 2015-11-12 NOTE — Care Management Note (Signed)
Case Management Note  Patient Details  Name: Dylan BennettRichard J Barron MRN: [id number] Date of Birth: Oct 15, 1928  Subjective/Objective:         Hypoxia with sepsis and chf vs pnc           Action/Plan: Date: November 12, 2015 Chart reviewed for concurrent status and case management needs. Will continue to follow patient for changes and needs: Marcelle Smilinghonda [name], RN, BSN, [place]   [phone]  Expected Discharge Date:                  Expected Discharge Plan:  Home w Home Health Services  In-House Referral:  NA  Discharge planning Services  CM Consult  Post Acute Care Choice:  Home Health, Durable Medical Equipment Choice offered to:  Spouse  DME Arranged:    DME Agency:     HH Arranged:    HH Agency:     Status of Service:  In process, will continue to follow  Medicare Important Message Given:    Date Medicare IM Given:    Medicare IM give by:    Date Additional Medicare IM Given:    Additional Medicare Important Message give by:     If discussed at Long Length of Stay Meetings, dates discussed:    Additional Comments:  Golda AcreDavis, [name], RN 11/12/2015, 8:09 AM

## 2015-11-12 NOTE — Progress Notes (Signed)
CRITICAL VALUE ALERT  Critical value received:  Troponin 25.52  Date of notification:  11/12/2015  Time of notification:  0920  Critical value read back:Yes.    Nurse who received alert:  Drue Stageraroline Manuel [place]. Have not yet been called. Saw the value was critical and relayed it to cardiology.  MD notified (1st page): Nada BoozerLaura Ingold.  Time of first page:  0925  MD notified (2nd page):  Time of second page:  Responding MD:  Nada BoozerLaura Ingold.  Time MD responded:  [phone]

## 2015-11-12 NOTE — Progress Notes (Signed)
ANTICOAGULATION CONSULT NOTE - Initial Consult  Pharmacy Consult for IV heparin Indication: chest pain/ACS  Allergies  Allergen Reactions  . Morphine And Related     Altered mental status  . Tetracyclines & Related     "Raw throat"    Patient Measurements: Height: 5\' 9"  (175.3 cm) Weight: 200 lb (90.719 kg) IBW/kg (Calculated) : 70.7 Heparin Dosing Weight: 77 kg  Vital Signs: Temp: 100 F (37.8 C) (12/20 2330) Temp Source: Rectal (12/20 2138) BP: 163/63 mmHg (12/20 2300) Pulse Rate: 84 (12/20 2300)  Labs:  Recent Labs  11/11/15 2145 11/11/15 2227  HGB 12.6*  --   HCT 38.5*  --   PLT 247  --   CREATININE 1.34*  --   TROPONINI  --  2.20*    Estimated Creatinine Clearance: 44.9 mL/min (by C-G formula based on Cr of 1.34).   Medical History: Past Medical History  Diagnosis Date  . Hypertension   . Diabetes mellitus   . Dyslipidemia   . Coronary artery disease     status post CABG. He is also status post PTCA and stenting of the circumflex artery  . Heart murmur   . Renal calculi   . 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 Cataract   . Myocardial infarction (HCC)     Medications:   (Not in a hospital admission) Scheduled:  Infusions:   Assessment: [age] yoM with COPD c/o altered mental status now with Trop=2.20.  IV heparin per Rx for ACS. Goal of Therapy:  Heparin level 0.3-0.7 units/ml Monitor platelets by anticoagulation protocol: Yes   Plan:   Baseline coags stat  Heparin 4000 unit bolus x1  Start drip @ 900 units/hr  Daily CBC/HL  Check 1st HL in 8 hours  Lorenza EvangelistGreen, Jahbari Repinski R 11/12/2015,12:03 AM

## 2015-11-12 NOTE — Progress Notes (Signed)
Pt declines to watch PCI video

## 2015-11-12 NOTE — Progress Notes (Signed)
[facility]. All V/S stable and patient is not in acute distress or pain. Wife notified of transfer and [facility]. Per MD [facility].

## 2015-11-12 NOTE — Progress Notes (Signed)
ANTICOAGULATION CONSULT NOTE   Pharmacy Consult for IV heparin Indication: chest pain/ACS  Allergies  Allergen Reactions  . Morphine And Related     Altered mental status  . Tetracyclines & Related     "Raw throat"    Patient Measurements: Height: 5\' 9"  (175.3 cm) Weight: 202 lb 6.1 oz (91.8 kg) IBW/kg (Calculated) : 70.7 Heparin Dosing Weight: 77 kg  Vital Signs: Temp: 100 F (37.8 C) (12/20 2330) Temp Source: Rectal (12/20 2138) BP: 112/31 mmHg (12/21 0700) Pulse Rate: 60 (12/21 0700)  Labs:  Recent Labs  11/11/15 2145 11/11/15 2227 11/12/15 0335 11/12/15 0400 11/12/15 0819  HGB 12.6*  --   --  11.9*  --   HCT 38.5*  --   --  35.5*  --   PLT 247  --   --  230  --   APTT 30  --   --   --   --   LABPROT 14.8  --   --   --   --   INR 1.15  --   --   --   --   HEPARINUNFRC  --   --   --   --  0.37  CREATININE 1.34*  --   --  1.39*  --   TROPONINI  --  2.20* 18.18*  --   --     Estimated Creatinine Clearance: 43.5 mL/min (by C-G formula based on Cr of 1.39).   Medical History: Past Medical History  Diagnosis Date  . Hypertension   . Diabetes mellitus   . Dyslipidemia   . Coronary artery disease     status post CABG. He is also status post PTCA and stenting of the circumflex artery  . Heart murmur   . Renal calculi   . 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 Cataract   . Myocardial infarction [facility](HCC)     Assessment: [age] yoM with COPD c/o altered mental status now with elevated troponin.  IV heparin per Rx for ACS. On heparin gtt, beta-blocker, ASA, statin (simvastatin 20mg ), imdur, ranexa, and lisinopril (no prev reduced EF)  Today's labs, 11/12/2015   Initial heparin level therapeutic  CBC stable  Troponin continues to trend up, now > 25  LFTs WNL  ECHO from 12/21 pending (prev ECHO revealed normal EF in Jan)  Goal of Therapy:  Heparin level 0.3-0.7 units/ml Monitor platelets by anticoagulation protocol: Yes    Plan:   Continue heparin at 900 units/hr  Recheck heparin level in 8h  Consider changing to high-intensity statin therapy  Juliette Alcideustin Zeigler, PharmD, BCPS.   Pager: [phone] 11/12/2015 9:04 AM

## 2015-11-12 NOTE — Progress Notes (Signed)
  Echocardiogram 2D Echocardiogram with Definity has been performed.  [name], [name] 11/12/2015, 12:09 PM

## 2015-11-12 NOTE — Progress Notes (Signed)
CRITICAL VALUE ALERT  Critical value received:  Troponin 18.18  Date of notification:  11/12/15  Time of notification:  0439  Critical value read back: yes  Nurse who received alert:  Adline PealsMelinda Brighten Buzzelli  MD notified (1st page):  [place]  Time of first page:  0445  MD notified (2nd page):  Time of second page:  Responding MD:  Maryfrances Bunnellanford  Time MD responded:  [phone]

## 2015-11-12 NOTE — Progress Notes (Addendum)
Pt wants his wife to sign the consent form for him. Unable to contact wife telephonically at this time. Will continue to try.  Pts R groin and L wrist electrically shaved

## 2015-11-12 NOTE — Consult Note (Signed)
Reason for Consult: elevated troponin and chronic diastolic HF and CAD   Referring Physician: Dr. Loleta Books   PCP:  Dylan Reel, MD  Primary Cardiologist:Dr. Maryann Alar [name] is an 81 y.o. male.    Chief Complaint: admitted 11/12/15 around 1 am with fever confusion and cough CAP but also elevated troponin  HPI: Asked to see 81 year old male for elevated troponin, hx CAD and chronic diastolic HF.  Pt with a history of coronary artery disease and coronary artery bypass grafting X 4 prior to 2001. He has a history of hypertension, peripheral neuropathy, hyperlipidemia, DM on ranexa and imdur.  Now admitted with CAP with fever, confusion and cough.  Found to have elevated troponin 2.20, this AM 18.18-->25.52.  BNP 414.  EKG SR with no acute changes from 05/2015 and today's EKG SR no acute changes.   Last Cath in 2004 with severe native disease with hx PCI 2001 of prox LCX, occluded VG to PLA and 1 & 2nd OM.   "CONCLUSION: 1. Severe native coronary artery disease. 2. Patent left internal mammary artery to left anterior descending graft and  a patent saphenous vein graft to the first diagonal vessel.  The grafts to the entire left circumflex system are occluded. The patient does have a left main stenosis, but it does not appear that angioplasty or stenting of this stenosis at this point would be of any benefit. The distal branches from the left circumflex artery are occluded/subtotally occluded and I do not think that angioplasty and stenting of the left main would increase the blood flow down into the distal vessels. We will continue with medical therapy. It is another risk is that we could risk jailing and compromising the ostium of the ramus intermediate branch by trying to perform an angioplasty and stent on the left circumflex vessel. I think that it is best treated medically. We will continue to watch him and will intervene only if  necessary."  Last Echo 11/2014 with EF 55-60% with hypokinesis of basal inferolateral wall with overall preserved LV function.  G2DD, mild LAE.   Pt now on IV heparin.  Respirations elevated and BP labile 169/57 to 105/94. SP02 on 3 L now 99-96 % but this is improved from admit.  With treatment for CAP pt improving.   Today oriented to person and place, very weak falls to sleep easily, his wife of 8 years is with him and states over last month with any exertion he becomes very SOB- most likely his anginal equivalent.  Yesterday he did have chest tightness.  None today.  No prior chest pain only increased DOE.    Past Medical History  Diagnosis Date  . Hypertension   . Diabetes mellitus   . Dyslipidemia   . Coronary artery disease     status post CABG. He is also status post PTCA and stenting of the circumflex artery  . Heart murmur   . Renal calculi   . Peripheral autonomic neuropathy due to diabetes mellitus ([place])   . Cataract   . Myocardial infarction ([place])   . NSTEMI (non-ST elevated myocardial infarction) ([place]) 11/12/2015    Past Surgical History  Procedure Laterality Date  . Lithotripsy    . US echocardiography  01-16-09    EF 55-60  . Cardiovascular stress test  09-26-03    EF 43%  . Cardiac catheterization    . Coronary artery bypass graft  CABG X 4  . Coronary angioplasty with stent placement    . Tonsillectomy    . Cataract extraction w/ intraocular lens implant      right  . Back surgery      x6  . Total hip arthroplasty  10/29/2011    Procedure: TOTAL HIP ARTHROPLASTY;  Surgeon: Mauri Pole;  Location: [facility];  Service: Orthopedics;  Laterality: Right;  . Joint replacement    . Eye surgery    . Spine surgery      Family History  Problem Relation Age of Onset  . Pneumonia Father     Deceased age 72   Social History:  reports that he has quit smoking. His smoking use included Pipe. He has never used smokeless tobacco. He reports that he does not drink  alcohol or use illicit drugs.  Allergies:  Allergies  Allergen Reactions  . Morphine And Related     Altered mental status  . Tetracyclines & Related     "Raw throat"   OUTPATIENT MEDICATIONS: No current facility-administered medications on file prior to encounter.   Current Outpatient Prescriptions on File Prior to Encounter  Medication Sig Dispense Refill  . amLODipine (NORVASC) 2.5 MG tablet TAKE 1 TABLET BY MOUTH EVERY DAY 30 tablet 11  . atenolol (TENORMIN) 50 MG tablet TAKE 1 TABLET BY MOUTH DAILY 90 tablet 3  . esomeprazole (NEXIUM) 40 MG capsule Take 40 mg by mouth daily before breakfast.      . fenofibrate 160 MG tablet Take 160 mg by mouth daily.      Marland Kitchen gabapentin (NEURONTIN) 300 MG capsule Take 300 mg by mouth 2 (two) times daily.     . Insulin Glargine (LANTUS [place]) Inject 40 Units into the skin at bedtime.     . Insulin Lispro, Human, (HUMALOG PEN [place]) Inject into the skin. Slide Scale    . isosorbide mononitrate (IMDUR) 30 MG 24 hr tablet Take 1 tablet (30 mg total) by mouth daily. 90 tablet 11  . lisinopril (PRINIVIL,ZESTRIL) 20 MG tablet Take 20 mg by mouth daily.  9  . RANEXA 1000 MG SR tablet TAKE 1 TABLET BY MOUTH TWICE A DAY 60 tablet 11  . simvastatin (ZOCOR) 20 MG tablet TAKE 1 TABLET BY MOUTH AT BEDTIME 30 tablet 6  . traMADol (ULTRAM) 50 MG tablet Take 50 mg by mouth 2 (two) times daily.    Marland Kitchen aspirin EC 81 MG tablet Take 1 tablet (81 mg total) by mouth daily. (Patient not taking: Reported on 11/07/2015)    . [DISCONTINUED] ferrous sulfate 325 (65 FE) MG tablet Take 1 tablet (325 mg total) by mouth 3 (three) times daily after meals.     CURRENT MEDICATIONS: Scheduled Meds: . amLODipine  2.5 mg Oral Daily  . aspirin EC  81 mg Oral Daily  . atenolol  50 mg Oral Daily  . fenofibrate  160 mg Oral Daily  . furosemide  10 mg Oral Daily  . gabapentin  300 mg Oral BID  . insulin aspart  0-15 Units Subcutaneous 6 times per day  . insulin glargine  20 Units  Subcutaneous QHS  . isosorbide mononitrate  30 mg Oral Daily  . [START ON 11/13/2015] levofloxacin (LEVAQUIN) IV  750 mg Intravenous Q48H  . lisinopril  20 mg Oral Daily  . pantoprazole  40 mg Oral Daily  . ranolazine  1,000 mg Oral BID  . simvastatin  20 mg Oral QHS  . sodium chloride  3 mL  Intravenous Q12H   Continuous Infusions: . heparin 900 Units/hr (11/12/15 0031)   PRN Meds:.nitroGLYCERIN, perflutren lipid microspheres (DEFINITY) IV suspension   Results for orders placed or performed during the hospital encounter of 11/11/15 (from the past 48 hour(s))  Comprehensive metabolic panel     Status: Abnormal   Collection Time: 11/11/15  9:45 PM  Result Value Ref Range   Sodium 142 135 - 145 mmol/L   Potassium 4.3 3.5 - 5.1 mmol/L   Chloride 105 101 - 111 mmol/L   CO2 28 22 - 32 mmol/L   Glucose, Bld 253 (H) 65 - 99 mg/dL   BUN 35 (H) 6 - 20 mg/dL   Creatinine, Ser 1.34 (H) 0.61 - 1.24 mg/dL   Calcium 9.6 8.9 - 10.3 mg/dL   Total Protein 7.1 6.5 - 8.1 g/dL   Albumin 4.0 3.5 - 5.0 g/dL   AST 27 15 - 41 U/L   ALT 14 (L) 17 - 63 U/L   Alkaline Phosphatase 42 38 - 126 U/L   Total Bilirubin 0.7 0.3 - 1.2 mg/dL   GFR calc non Af Amer 47 (L) >60 mL/min   GFR calc Af Amer 54 (L) >60 mL/min    Comment: (NOTE) The eGFR has been calculated using the CKD EPI equation. This calculation has not been validated in all clinical situations. eGFR's persistently <60 mL/min signify possible Chronic Kidney Disease.    Anion gap 9 5 - 15  CBC with Differential     Status: Abnormal   Collection Time: 11/11/15  9:45 PM  Result Value Ref Range   WBC 10.3 4.0 - 10.5 K/uL   RBC 3.74 (L) 4.22 - 5.81 MIL/uL   Hemoglobin 12.6 (L) 13.0 - 17.0 g/dL   HCT 38.5 (L) 39.0 - 52.0 %   MCV 102.9 (H) 78.0 - 100.0 fL   MCH 33.7 26.0 - 34.0 pg   MCHC 32.7 30.0 - 36.0 g/dL   RDW 14.2 11.5 - 15.5 %   Platelets 247 150 - 400 K/uL   Neutrophils Relative % 67 %   Neutro Abs 6.9 1.7 - 7.7 K/uL   Lymphocytes  Relative 24 %   Lymphs Abs 2.4 0.7 - 4.0 K/uL   Monocytes Relative 8 %   Monocytes Absolute 0.8 0.1 - 1.0 K/uL   Eosinophils Relative 1 %   Eosinophils Absolute 0.1 0.0 - 0.7 K/uL   Basophils Relative 0 %   Basophils Absolute 0.0 0.0 - 0.1 K/uL  Culture, blood (routine x 2)     Status: None (Preliminary result)   Collection Time: 11/11/15  9:45 PM  Result Value Ref Range   Specimen Description BLOOD RIGHT ANTECUBITAL    Special Requests BOTTLES DRAWN AEROBIC AND ANAEROBIC 8ML    Culture      NO GROWTH < 12 HOURS Performed at [facility]    Report Status PENDING   APTT     Status: None   Collection Time: 11/11/15  9:45 PM  Result Value Ref Range   aPTT 30 24 - 37 seconds  Protime-INR     Status: None   Collection Time: 11/11/15  9:45 PM  Result Value Ref Range   Prothrombin Time 14.8 11.6 - 15.2 seconds   INR 1.15 0.00 - 1.49  I-Stat CG4 Lactic Acid, ED (Not at [facility])     Status: None   Collection Time: 11/11/15 10:00 PM  Result Value Ref Range   Lactic Acid, Venous 1.47 0.5 - 2.0 mmol/L  Brain natriuretic peptide     Status: Abnormal   Collection Time: 11/11/15 10:27 PM  Result Value Ref Range   B Natriuretic Peptide 414.8 (H) 0.0 - 100.0 pg/mL  Troponin I     Status: Abnormal   Collection Time: 11/11/15 10:27 PM  Result Value Ref Range   Troponin I 2.20 (HH) <0.031 ng/mL    Comment:        POSSIBLE MYOCARDIAL ISCHEMIA. SERIAL TESTING RECOMMENDED. CRITICAL RESULT CALLED TO, READ BACK BY AND VERIFIED WITH: K DREWRY RN @ [phone] ON 11/11/15 BY C [name]   Urinalysis, Routine w reflex microscopic (not at [facility])     Status: Abnormal   Collection Time: 11/12/15 12:28 AM  Result Value Ref Range   Color, Urine YELLOW YELLOW   APPearance CLEAR CLEAR   Specific Gravity, Urine 1.018 1.005 - 1.030   pH 6.0 5.0 - 8.0   Glucose, UA 500 (A) NEGATIVE mg/dL   Hgb urine dipstick LARGE (A) NEGATIVE   Bilirubin Urine NEGATIVE NEGATIVE   Ketones, ur NEGATIVE NEGATIVE mg/dL    Protein, ur NEGATIVE NEGATIVE mg/dL   Nitrite NEGATIVE NEGATIVE   Leukocytes, UA NEGATIVE NEGATIVE  Urine microscopic-add on     Status: Abnormal   Collection Time: 11/12/15 12:28 AM  Result Value Ref Range   Squamous Epithelial / LPF 0-5 (A) NONE SEEN   WBC, UA 0-5 0 - 5 WBC/hpf   RBC / HPF TOO NUMEROUS TO COUNT 0 - 5 RBC/hpf   Bacteria, UA RARE (A) NONE SEEN  CBG monitoring, ED     Status: Abnormal   Collection Time: 11/12/15  1:36 AM  Result Value Ref Range   Glucose-Capillary 220 (H) 65 - 99 mg/dL  MRSA PCR Screening     Status: None   Collection Time: 11/12/15  2:56 AM  Result Value Ref Range   MRSA by PCR NEGATIVE NEGATIVE    Comment:        The GeneXpert MRSA Assay (FDA approved for NASAL specimens only), is one component of a comprehensive MRSA colonization surveillance program. It is not intended to diagnose MRSA infection nor to guide or monitor treatment for MRSA infections.   Influenza panel by PCR (type A & B, H1N1)     Status: None   Collection Time: 11/12/15  3:30 AM  Result Value Ref Range   Influenza A By PCR NEGATIVE NEGATIVE   Influenza B By PCR NEGATIVE NEGATIVE   H1N1 flu by pcr NOT DETECTED NOT DETECTED    Comment:        The Xpert Flu assay (FDA approved for nasal aspirates or washes and nasopharyngeal swab specimens), is intended as an aid in the diagnosis of influenza and should not be used as a sole basis for treatment. Performed at [facility]   Troponin I (q 6hr x 3)     Status: Abnormal   Collection Time: 11/12/15  3:35 AM  Result Value Ref Range   Troponin I 18.18 (HH) <0.031 ng/mL    Comment:        POSSIBLE MYOCARDIAL ISCHEMIA. SERIAL TESTING RECOMMENDED. DELTA CHECK NOTED CRITICAL RESULT CALLED TO, READ BACK BY AND VERIFIED WITH: Judge Stall RN @ [phone] ON 11/12/15 BY C [name]   Procalcitonin - Baseline     Status: None   Collection Time: 11/12/15  3:35 AM  Result Value Ref Range   Procalcitonin <0.10 ng/mL    Comment:         Interpretation: PCT (Procalcitonin) <=  0.5 ng/mL: Systemic infection (sepsis) is not likely. Local bacterial infection is possible. (NOTE)         ICU PCT Algorithm               Non ICU PCT Algorithm    ----------------------------     ------------------------------         PCT < 0.25 ng/mL                 PCT < 0.1 ng/mL     Stopping of antibiotics            Stopping of antibiotics       strongly encouraged.               strongly encouraged.    ----------------------------     ------------------------------       PCT level decrease by               PCT < 0.25 ng/mL       >= 80% from peak PCT       OR PCT 0.25 - 0.5 ng/mL          Stopping of antibiotics                                             encouraged.     Stopping of antibiotics           encouraged.    ----------------------------     ------------------------------       PCT level decrease by              PCT >= 0.25 ng/mL       < 80% from peak PCT        AND PCT >= 0.5 ng/mL            Continuin g antibiotics                                              encouraged.       Continuing antibiotics            encouraged.    ----------------------------     ------------------------------     PCT level increase compared          PCT > 0.5 ng/mL         with peak PCT AND          PCT >= 0.5 ng/mL             Escalation of antibiotics                                          strongly encouraged.      Escalation of antibiotics        strongly encouraged.   CBC     Status: Abnormal   Collection Time: 11/12/15  4:00 AM  Result Value Ref Range   WBC 7.5 4.0 - 10.5 K/uL   RBC 3.47 (L) 4.22 - 5.81 MIL/uL   Hemoglobin 11.9 (L) 13.0 - 17.0 g/dL   HCT 35.5 (L) 39.0 - 52.0 %   MCV 102.3 (H) 78.0 - 100.0 fL  MCH 34.3 (H) 26.0 - 34.0 pg   MCHC 33.5 30.0 - 36.0 g/dL   RDW 14.1 11.5 - 15.5 %   Platelets 230 150 - 400 K/uL  Creatinine, serum     Status: Abnormal   Collection Time: 11/12/15  4:00 AM  Result Value Ref Range    Creatinine, Ser 1.39 (H) 0.61 - 1.24 mg/dL   GFR calc non Af Amer 45 (L) >60 mL/min   GFR calc Af Amer 52 (L) >60 mL/min    Comment: (NOTE) The eGFR has been calculated using the CKD EPI equation. This calculation has not been validated in all clinical situations. eGFR's persistently <60 mL/min signify possible Chronic Kidney Disease.   Glucose, capillary     Status: Abnormal   Collection Time: 11/12/15  5:09 AM  Result Value Ref Range   Glucose-Capillary 297 (H) 65 - 99 mg/dL  Glucose, capillary     Status: Abnormal   Collection Time: 11/12/15  7:31 AM  Result Value Ref Range   Glucose-Capillary 245 (H) 65 - 99 mg/dL  Heparin level (unfractionated)     Status: None   Collection Time: 11/12/15  8:19 AM  Result Value Ref Range   Heparin Unfractionated 0.37 0.30 - 0.70 IU/mL    Comment:        IF HEPARIN RESULTS ARE BELOW EXPECTED VALUES, AND PATIENT DOSAGE HAS BEEN CONFIRMED, SUGGEST FOLLOW UP TESTING OF ANTITHROMBIN III LEVELS.   Troponin I (q 6hr x 3)     Status: Abnormal   Collection Time: 11/12/15  8:19 AM  Result Value Ref Range   Troponin I 25.52 (HH) <0.031 ng/mL    Comment:        POSSIBLE MYOCARDIAL ISCHEMIA. SERIAL TESTING RECOMMENDED. CRITICAL VALUE NOTED.  VALUE IS CONSISTENT WITH PREVIOUSLY REPORTED AND CALLED VALUE.    Dg Chest Port 1 View  11/11/2015  CLINICAL DATA:  Fever and wheezing EXAM: PORTABLE CHEST - 1 VIEW COMPARISON:  10/28/2011 FINDINGS: Cardiac shadow is again enlarged. Postsurgical changes are again seen. Elevation the right hemidiaphragm is noted. Vascular congestion with patchy edematous changes are seen this likely represents CHF although the possibility of superimposed infiltrate deserves consideration as well. IMPRESSION: Increased vascular congestion and patchy changes likely related to CHF. Electronically Signed   By: Inez Catalina M.D.   On: 11/11/2015 21:50    ROS: General:+ colds + fevers, + weight gain since July of 10-12 lbs Skin:no  rashes or ulcers HEENT:no blurred vision, no congestion CV:see HPI PUL:see HPI GI:no diarrhea constipation or melena, no indigestion GU:no hematuria, no dysuria MS:no joint pain, no claudication Neuro:no syncope, no lightheadedness Endo:+ diabetes, no thyroid disease   Blood pressure 120/33, pulse 59, temperature 98 F (36.7 C), temperature source Oral, resp. rate 21, height '5\' 9"'$  (1.753 m), weight 202 lb 6.1 oz (91.8 kg), SpO2 96 %.  Wt Readings from Last 3 Encounters:  11/12/15 202 lb 6.1 oz (91.8 kg)  11/07/15 200 lb (90.719 kg)  05/28/15 187 lb (84.823 kg)    PE: General:Pleasant affect, NAD, falls to sleep easily, appear weak  Skin:Warm and dry, brisk capillary refill HEENT:normocephalic, sclera clear, mucus membranes moist Neck:supple, no JVD, no bruits  Heart:S1S2 RRR without murmur, gallup, rub or click Lungs:clear, ant- without rales, rhonchi, or wheezes DXA:JOIN, non tender, + BS, do not palpate liver spleen or masses Ext:no lower ext edema, 2+ pedal pulses, 2+ radial pulses Neuro:awake and oriented, MAE, follows commands, + facial symmetry Tele: SR 1st degree block  Assessment/Plan Principal Problem:   CAP (community acquired pneumonia) Active Problems:   Essential hypertension   Diabetes mellitus with neurological manifestation (HCC)   CHF (congestive heart failure) (HCC)   Elevated troponin   CKD (chronic kidney disease), stage III   Altered mental state   NSTEMI (non-ST elevated myocardial infarction) ([place])  NSTEMI with troponin 25 I, agree with Echo to eval LV function.  Pt has been treated medically but upper dose of ranexa and imdur.  Also on BB,ACE.  Stage 3 CKD- would allow recovery from CAP and either cath vs nuc study.  MD to see and evaluate. Pt weak today if cath would like to wait til Thursday or Friday --on BB, ACE, Ranexa, Imdur --IV heparin for 48-72 hours  Chronic diastolic HF. currently stable  -481 since admit rec'd 40 mg IV in ER, now back  on 10 mg daily  HTN-essential. Elevated during the night, amlodipine increased during the night to 10 mg.  Warren Danes  Nurse Practitioner Certified [place] Pager [phone] or after 5pm or weekends call [phone] 11/12/2015, 12:30 PM Agree with evaluation and plan as noted above.  I personally reviewed his chest x-ray which is suggestive of significant CHF on admission.  He has had a good response to IV Lasix given to him yesterday evening.  He was not having any respiratory distress now.  He is also mentally much clear.  History reveals that yesterday he did have a weight like sensation on his chest consistent with angina pectoris.  He has been having frequent episodes of chest tightness.  I reviewed his EKG personally which does not show any evidence of STEMI but does show increased T-wave abnormalities since prior tracings consistent with non-STEMI.  The cough that he had last night was probably cardiac in origin.  His white count is normal.  He has not coughed up any purulent sputum and he has had no fever.  He is being covered nonetheless for possible community-acquired pneumonia.  His troponins have been elevated 3 with the most recent up to 25.  I would favor transfer to [facility] today and unless he has more severe chest discomfort we will plan to do cardiac catheterization tomorrow.  We can take him on the cardiac service and ask medicine to consult for his diabetes and other noncardiac issues.  We will increase him to high intensity statin Lipitor 80 mg daily.  On exam now he is lucid and appropriate.  His lungs are essentially clear.  The heart reveals no gallop or rub.  The abdomen is nontender.  Extremities show no phlebitis. Plan: Transfer later today to [facility] and plan for cardiac catheterization tomorrow.  Continue IV heparin.  Continue aspirin and beta blocker and statin.

## 2015-11-12 NOTE — Progress Notes (Signed)
[facility] PROGRESS NOTE  Dylan DimesRichard J Barron ZOX:[id number] DOB: [birth year] DOA: 11/11/2015 PCP: Gwen PoundsUSSO,JOHN M, MD  HPI/Brief narrative 81 y.o. male with a past medical history significant for HFpEF, CAD s/p CABG, HTN, and IDDM who presents with fever, confusion, and cough. Pt noted to have fevers with likely CAP, started abx. Patient also found to have elevated troponin with continued trend up to over 20+. Cardiology following  Assessment/Plan: 1. Fever, hypoxia, dyspnea, cough, altered mental status:  This is new. This seems most consistent with a community acquired pneumonia. His CXR is non-focal, suggesting atypical or viral organism. He meets criteria for sepsis given tachycardia, tachypnea and altered mental status with pneuomonia.  -continue Levofloxacin IV  -Influenza neg -No leukocytosis -Cont to follow. As long as pt is improving, would recommend total of 5 days of abx  2. Elevated troponin/NSTEMI:  This is new. Suspect ACS. -Cont Heparin gtt -Trend troponin -Nitroglycerin SL for chest pressure -Cardiology was consulted. Discussed with Cards. [facility] service for cath on 12/22  3. Chronic diastolic CHF and HTN:  -Continue home amlodipine, ACEI, BB -Cotninue home Imdur, ranolazine -Continue home fenofibrate and simvastatin  -Restarted home aspirin  4. IDDM:  -Lantus, half home dose at 20 units while NPO -Sliding scale insulin q4hrs  5. CKD stage III:  Stable.  -Avoid nephrotoxins  Code Status: Full Family Communication: Pt in room, family at bedside Disposition Plan: [facility] under care of Cardiology service   Consultants:  Cardiology  Procedures:    Antibiotics: Anti-infectives    Start     Dose/Rate Route Frequency Ordered Stop   11/13/15 2300  levofloxacin (LEVAQUIN) IVPB 750 mg  Status:  Discontinued     750 mg 100 mL/hr over 90 Minutes Intravenous Every 24 hours 11/12/15 0315 11/12/15  0323   11/13/15 2200  levofloxacin (LEVAQUIN) IVPB 750 mg     750 mg 100 mL/hr over 90 Minutes Intravenous Every 48 hours 11/12/15 0323     11/11/15 2315  levofloxacin (LEVAQUIN) tablet 750 mg     750 mg Oral  Once 11/11/15 2312 11/11/15 2340      HPI/Subjective: Denies chest pain, but still with mild sob  Objective: Filed Vitals:   11/12/15 1100 11/12/15 1200 11/12/15 1300 11/12/15 1554  BP: 129/46 130/44 126/41   Pulse: 58 60 60   Temp:  98 F (36.7 C)  98 F (36.7 C)  TempSrc:  Oral  Oral  Resp: 16 20 20    Height:      Weight:      SpO2: 97% 96% 97%     Intake/Output Summary (Last 24 hours) at 11/12/15 1618 Last data filed at 11/12/15 1300  Gross per 24 hour  Intake 124.35 ml  Output    550 ml  Net -425.65 ml   Filed Weights   11/11/15 2150 11/12/15 0500  Weight: 90.719 kg (200 lb) 91.8 kg (202 lb 6.1 oz)    Exam:   General:  Awake, in nad  Cardiovascular: regular, s1,s 2  Respiratory: normal resp effort, no wheezing  Abdomen: soft, nondistended  Musculoskeletal: perfused, no clubbing   Data Reviewed: Basic Metabolic Panel:  Recent Labs Lab 11/11/15 2145 11/12/15 0400  NA 142  --   K 4.3  --   CL 105  --   CO2 28  --   GLUCOSE 253*  --   BUN 35*  --   CREATININE 1.34* 1.39*  CALCIUM 9.6  --  Liver Function Tests:  Recent Labs Lab 11/11/15 2145  AST 27  ALT 14*  ALKPHOS 42  BILITOT 0.7  PROT 7.1  ALBUMIN 4.0   No results for input(s): LIPASE, AMYLASE in the last 168 hours. No results for input(s): AMMONIA in the last 168 hours. CBC:  Recent Labs Lab 11/11/15 2145 11/12/15 0400  WBC 10.3 7.5  NEUTROABS 6.9  --   HGB 12.6* 11.9*  HCT 38.5* 35.5*  MCV 102.9* 102.3*  PLT 247 230   Cardiac Enzymes:  Recent Labs Lab 11/11/15 2227 11/12/15 0335 11/12/15 0819 11/12/15 1441  TROPONINI 2.20* 18.18* 25.52* 25.58*   BNP (last 3 results)  Recent Labs  11/11/15 2227  BNP 414.8*    ProBNP (last 3 results) No  results for input(s): PROBNP in the last 8760 hours.  CBG:  Recent Labs Lab 11/12/15 0136 11/12/15 0509 11/12/15 0731 11/12/15 1222 11/12/15 1537  GLUCAP 220* 297* 245* 178* 288*    Recent Results (from the past 240 hour(s))  Culture, blood (routine x 2)     Status: None (Preliminary result)   Collection Time: 11/11/15  9:45 PM  Result Value Ref Range Status   Specimen Description BLOOD RIGHT ANTECUBITAL  Final   Special Requests BOTTLES DRAWN AEROBIC AND ANAEROBIC  Final   Culture   Final    NO GROWTH < 12 HOURS Performed at [facility]    Report Status PENDING  Incomplete  MRSA PCR Screening     Status: None   Collection Time: 11/12/15  2:56 AM  Result Value Ref Range Status   MRSA by PCR NEGATIVE NEGATIVE Final    Comment:        The GeneXpert MRSA Assay (FDA approved for NASAL specimens only), is one component of a comprehensive MRSA colonization surveillance program. It is not intended to diagnose MRSA infection nor to guide or monitor treatment for MRSA infections.      Studies: Dg Chest Port 1 View  11/11/2015  CLINICAL DATA:  Fever and wheezing EXAM: PORTABLE CHEST - 1 VIEW COMPARISON:  10/28/2011 FINDINGS: Cardiac shadow is again enlarged. Postsurgical changes are again seen. Elevation the right hemidiaphragm is noted. Vascular congestion with patchy edematous changes are seen this likely represents CHF although the possibility of superimposed infiltrate deserves consideration as well. IMPRESSION: Increased vascular congestion and patchy changes likely related to CHF. Electronically Signed   By: Alcide Clever M.D.   On: 11/11/2015 21:50    Scheduled Meds: . amLODipine  2.5 mg Oral Daily  . aspirin EC  81 mg Oral Daily  . atenolol  50 mg Oral Daily  . atorvastatin  80 mg Oral q1800  . furosemide  10 mg Oral Daily  . gabapentin  300 mg Oral BID  . heparin  1,000 Units Intravenous Once  . insulin aspart  0-15 Units Subcutaneous 6 times per day   . insulin glargine  20 Units Subcutaneous QHS  . isosorbide mononitrate  30 mg Oral Daily  . [START ON 11/13/2015] levofloxacin (LEVAQUIN) IV  750 mg Intravenous Q48H  . lisinopril  20 mg Oral Daily  . pantoprazole  40 mg Oral Daily  . ranolazine  1,000 mg Oral BID  . sodium chloride  3 mL Intravenous Q12H   Continuous Infusions: . heparin 900 Units/hr (11/12/15 0031)    Principal Problem:   CAP (community acquired pneumonia) Active Problems:   Essential hypertension   Diabetes mellitus with neurological manifestation (HCC)   CHF (congestive heart  failure) (HCC)   Elevated troponin   CKD (chronic kidney disease), stage III   Altered mental state   NSTEMI (non-ST elevated myocardial infarction) (HCC)    Manfred Laspina K  [facility] Pager [phone]. If 7PM-7AM, please contact night-coverage at [email or web address], password [facility] 11/12/2015, 4:18 PM  LOS: 0 days

## 2015-11-12 NOTE — H&P (Signed)
History and Physical  Patient Name: [name]     ZOX:[id number]    DOB: 29-Dec-1927    DOA: 11/11/2015 Referring physician: Frederick Peers, MD PCP: Gwen Pounds, MD      Chief Complaint: Fever  HPI: [name] is a 81 y.o. male with a past medical history significant for HFpEF, CAD s/p CABG, HTN, and IDDM who presents with fever, confusion, and cough.  History is collected from the patient and his wife, both of whom are judged to be poor historians.  They report that over the last one month the patient has had increasing dyspnea on exertion and breathing difficulty. They are unclear of the reason, but yesterday they went to his PCP who prescribed home oxygen 2 L by nasal cannula. Today the patient developed a fever and seemed more confused than usual to his wife brought him to the ER. Because of respiratory difficulty the patient was administered DuoNeb nebs and Solu-Medrol by EMS on route.  In the ED, the patient had a low-grade fever to 100 F, tachycardia, tachypnea, hypertension, and hypoxia requiring 4 L of supplemental oxygen. His renal function was normal but he had an elevated BUN to creatinine ratio. The lactic acid level was normal. He had an high normal WBC. His troponin was 2.2 ng per mL, and BNP was moderately elevated. Chest x-ray showed pulmonary edema. An ECG showed no ST elevations. In the ER the patient was administered Levaquin,  heparin infusion was started, and [facility] were asked to admit for presumed CAP and ACS.     Review of Systems:  Pt complains of fever, chills, confusion, exertional dyspnea, leg swelling, breathing difficulty, cough, recent fall. All other systems negative except as just noted or noted in the history of present illness.  Allergies  Allergen Reactions  . Morphine And Related     Altered mental status  . Tetracyclines & Related     "Raw throat"    Prior to Admission medications   Medication Sig Start Date End Date Taking? Authorizing  Provider  amLODipine (NORVASC) 2.5 MG tablet TAKE 1 TABLET BY MOUTH EVERY DAY 09/08/15  Yes Vesta Mixer, MD  atenolol (TENORMIN) 50 MG tablet TAKE 1 TABLET BY MOUTH DAILY 06/05/15  Yes Vesta Mixer, MD  esomeprazole (NEXIUM) 40 MG capsule Take 40 mg by mouth daily before breakfast.     Yes Historical Provider, MD  fenofibrate 160 MG tablet Take 160 mg by mouth daily.     Yes Historical Provider, MD  furosemide (LASIX) 20 MG tablet Take 0.5 tablets by mouth daily. 10/30/15  Yes Historical Provider, MD  gabapentin (NEURONTIN) 300 MG capsule Take 300 mg by mouth 2 (two) times daily.    Yes Historical Provider, MD  Insulin Glargine (LANTUS [place]) Inject 40 Units into the skin at bedtime.    Yes Historical Provider, MD  Insulin Lispro, Human, (HUMALOG PEN [place]) Inject into the skin. Slide Scale   Yes Historical Provider, MD  isosorbide mononitrate (IMDUR) 30 MG 24 hr tablet Take 1 tablet (30 mg total) by mouth daily. 11/28/14  Yes Vesta Mixer, MD  lisinopril (PRINIVIL,ZESTRIL) 20 MG tablet Take 20 mg by mouth daily. 10/29/14  Yes Historical Provider, MD  mometasone (ELOCON) 0.1 % cream Apply 1 application topically 2 (two) times daily. Applies to nose 10/24/15  Yes Historical Provider, MD  RANEXA 1000 MG SR tablet TAKE 1 TABLET BY MOUTH TWICE A DAY 08/12/15  Yes Vesta Mixer, MD  simvastatin (  ZOCOR) 20 MG tablet TAKE 1 TABLET BY MOUTH AT BEDTIME 07/29/15  Yes Vesta Mixer, MD  traMADol (ULTRAM) 50 MG tablet Take 50 mg by mouth 2 (two) times daily.   Yes Historical Provider, MD  aspirin EC 81 MG tablet Take 1 tablet (81 mg total) by mouth daily. Patient not taking: Reported on 11/07/2015 07/30/14   Vesta Mixer, MD    Past Medical History  Diagnosis Date  . Hypertension   . Diabetes mellitus   . Dyslipidemia   . Coronary artery disease     status post CABG. He is also status post PTCA and stenting of the circumflex artery  . Heart murmur   . Renal calculi   . Peripheral autonomic neuropathy  due to diabetes mellitus (HCC)   . Cataract   . Myocardial infarction [facility])     Past Surgical History  Procedure Laterality Date  . Lithotripsy    . US echocardiography  01-16-09    EF 55-60  . Cardiovascular stress test  09-26-03    EF 43%  . Cardiac catheterization    . Coronary artery bypass graft      CABG X 4  . Coronary angioplasty with stent placement    . Tonsillectomy    . Cataract extraction w/ intraocular lens implant      right  . Back surgery      x6  . Total hip arthroplasty  10/29/2011    Procedure: TOTAL HIP ARTHROPLASTY;  Surgeon: Shelda Pal;  Location: [facility] ORS;  Service: Orthopedics;  Laterality: Right;  . Joint replacement    . Eye surgery    . Spine surgery      Family history: family history includes Pneumonia in his father.  Social History: Patient lives with his wife.  He is a former smoker of pipes.  He is from [place] originally.  He does not use a walker at [place].       Physical Exam: BP 133/84 mmHg  Pulse 71  Temp(Src) 100 F (37.8 C) (Rectal)  Resp 24  Ht  (1.753 m)  Wt 90.719 kg (200 lb)  BMI 29.52 kg/m2  SpO2 96% General appearance: Elderly adult male, confused but in no acute distress or apprehension, answers questions, makes eye contact.   Eyes: Anicteric, conjunctiva pink, lids and lashes normal.     ENT: No nasal deformity, discharge, or epistaxis.  OP tacky without lesions.   Lymph: No cervical lymphadenopathy. Skin: Warm and dry.   Cardiac: Tachycardic, regular, nl S1-S2, no murmurs appreciated.  Capillary refill is brisk.  JVP normal.  Mild pretibial edema.  Radial and DP pulses 2+ and symmetric. Respiratory: Tachypneic, diminished breath sounds at both bases, focal rales in right base. Abdomen: Abdomen soft without rigidity.  Mild diffuse TTP. Ventral hernia.   Neuro: Right sided coarse tremor.  Disoriented to place and time.  Oriented to self.  1/3 three item recall.  Responding to questions.  Speech is fluent.   Moves all extremities equally and with normal coordination.   Cranial nerves intact. Psych:  No evidence of aural or visual hallucinations or delusions.       Labs on Admission:  The metabolic panel shows normal sodium, potassium, bicarbonate. Hyperglycemia. The serum creatinine is 1.34 mg/dL which is baseline. The BUN is elevated. The transaminases and bilirubin are normal. The lactic acid level is normal. The troponin is 2.2 ng per mL. The BNP is 414 pg per mL. The complete blood  count shows WBC 10.3 K/uL, no anemia or thrombocytopenia.   Radiological Exams on Admission: Personally reviewed: Dg Chest Port 1 View 11/11/2015 Vascular congestion with patchy edematous changes are seen this likely represents CHF although the possibility of superimposed infiltrate deserves consideration as well. IMPRESSION: Increased vascular congestion and patchy changes likely related to CHF.     EKG: Independently reviewed. Sinus with LAD, 1st deg AVB, and rate 90.   Last echocardiogram 11/2014:  EF 55-60%   PFT from 12/2014:  No obstruction.  Low FVC and DLCO.    Assessment/Plan 1. Fever, hypoxia, dyspnea, cough, altered mental status:  This is new.  This seems most consistent with a community acquired pneumonia.  His CXR is non-focal, suggesting atypical or viral organism.  He meets criteria for sepsis given tachycardia, tachypnea and altered mental status with pneuomonia.   -Levofloxacin IV  -Procalcitonin protocol -Influenza swab and droplet precautions -IV fluid bolus and reassess   2. Elevated troponin:  This is new.  Suspect ACS. -Heparin gtt -Trend troponin -Consult to Cardiology -Nitroglycerin SL for chest pressure -Telemetry  3. Chronic diastolic CHF and HTN:  -Continue home amlodipine, ACEI, BB -Cotninue home Imdur, ranolazine -Continue home fenofibrate and simvastatin  -Restart home aspirin  4. IDDM:  -Lantus, half home dose at 20 units while NPO -Sliding scale  insulin q4hrs  5. CKD stage III:  Stable.  -Avoid nephrotoxins     DVT PPx: Heparin gtt Diet: NPO for possible cath Consultants: Cardiology Code Status: Full Family Communication: Wife, present at bedside.  Diagnosis of suspected pneumonia and heart injury discussed.  All questions answered.  CODE STATUS confirmed.  Medical decision making: What exists of the patient's previous chart was reviewed in depth and the case was discussed with Drs. Little and Sze from ED and Cardiology, respectively. Patient seen 12:52 AM on 11/12/2015.  Disposition Plan:  Admit for treatment of CAP and altered mental status and possible sepsis.  Admit to stepdown.  Trend troponin.  Consult with Cardiology.        Alberteen SamChristopher P Danford [facility] Pager [phone]

## 2015-11-12 NOTE — Progress Notes (Signed)
ANTICOAGULATION CONSULT NOTE   Pharmacy Consult for IV heparin Indication: chest pain/ACS  Allergies  Allergen Reactions  . Morphine And Related     Altered mental status  . Tetracyclines & Related     "Raw throat"    Patient Measurements: Height: 5\' 9"  (175.3 cm) Weight: 202 lb 6.1 oz (91.8 kg) IBW/kg (Calculated) : 70.7 Heparin Dosing Weight: 77 kg  Vital Signs: Temp: 98 F (36.7 C) (12/21 1554) Temp Source: Oral (12/21 1554) BP: 126/41 mmHg (12/21 1300) Pulse Rate: 60 (12/21 1300)  Labs:  Recent Labs  11/11/15 2145  11/12/15 0335 11/12/15 0400 11/12/15 0819 11/12/15 1441  HGB 12.6*  --   --  11.9*  --   --   HCT 38.5*  --   --  35.5*  --   --   PLT 247  --   --  230  --   --   APTT 30  --   --   --   --   --   LABPROT 14.8  --   --   --   --   --   INR 1.15  --   --   --   --   --   HEPARINUNFRC  --   --   --   --  0.37 0.29*  CREATININE 1.34*  --   --  1.39*  --   --   TROPONINI  --   < > 18.18*  --  25.52* 25.58*  < > = values in this interval not displayed.  Estimated Creatinine Clearance: 43.5 mL/min (by C-G formula based on Cr of 1.39).   Medical History: Past Medical History  Diagnosis Date  . Hypertension   . Diabetes mellitus   . Dyslipidemia   . Coronary artery disease     status post CABG. He is also status post PTCA and stenting of the circumflex artery  . Heart murmur   . Renal calculi   . 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 Cataract   . Myocardial infarction (HCC)   . NSTEMI (non-ST elevated myocardial infarction) (HCC) 11/12/2015    Assessment: [age] yoM with COPD c/o altered mental status now with elevated troponin.  IV heparin per Rx for ACS. On heparin gtt, beta-blocker, ASA, statin (simvastatin 20mg ), imdur, ranexa, and lisinopril (no prev reduced EF)  Today's labs, 11/12/2015   Initial heparin level therapeutic, confirmatory level is slightly subtherapeutic at 0.29  CBC stable  Troponin  continues to trend up, now > 25  LFTs WNL  ECHO from 12/21 pending (prev ECHO revealed normal EF in Jan)  Goal of Therapy:  Heparin level 0.3-0.7 units/ml Monitor platelets by anticoagulation protocol: Yes   Plan:   Give heparin 1000 unit bolus and increase heparin drip to 1050 units/hr  Recheck heparin level in 8h  Consider changing to high-intensity statin therapy  Adalberto ColeNikola Kaile Bixler, PharmD, BCPS Pager [phone] 11/12/2015 4:05 PM

## 2015-11-12 NOTE — ED Notes (Signed)
Pt w/ an episode of diaphoresis and tachypnea, BP noted to be acutely lower then previous blood pressures. Pt remains alert at this time. Admitting physician paged.

## 2015-11-12 NOTE — Progress Notes (Signed)
Possible Sepsis - Repeat Assessment  Performed at:    11/12/2015 4:04 AM  Vitals     Blood pressure 133/84, pulse 71, temperature 100 F (37.8 C), temperature source Rectal, resp. rate 24, height 5\' 9"  (1.753 m), weight 90.719 kg (200 lb), SpO2 96 %.  Heart:     Regular rate and rhythm Lungs:    CTA Capillary Refill:   <2 sec Peripheral Pulse:   Radial pulse palpable Skin:     Normal Color  Still suspect CAP, but sepsis syndrome doubted.  Patient mentating more clearly, states he feels much better.  Diuresed well with furosemide.  No chest pain.  Troponin repeat sent.  HR improved.  BP elevated.    Will administer oral antihypertensives now.   Continue home furosemide. Trend troponin.

## 2015-11-12 NOTE — Progress Notes (Signed)
Spiritual care was asked to come speak to pt and wife about advanced directive. Per pt they have the document at home. Per spiritual care family was asked to bring in the document to add to chart. Spiritual care will continue to follow up.

## 2015-11-13 ENCOUNTER — Inpatient Hospital Stay (HOSPITAL_COMMUNITY): Payer: Medicare Other

## 2015-11-13 ENCOUNTER — Encounter (HOSPITAL_COMMUNITY)
Admission: EM | Disposition: A | Discharge status: Skilled Nursing Facility | Payer: Self-pay | Source: Home / Self Care | Attending: Internal Medicine

## 2015-11-13 DIAGNOSIS — L8990 Pressure ulcer of unspecified site, unspecified stage: Secondary | ICD-10-CM | POA: Insufficient documentation

## 2015-11-13 DIAGNOSIS — I2510 Atherosclerotic heart disease of native coronary artery without angina pectoris: Secondary | ICD-10-CM

## 2015-11-13 DIAGNOSIS — J9601 Acute respiratory failure with hypoxia: Secondary | ICD-10-CM | POA: Insufficient documentation

## 2015-11-13 DIAGNOSIS — I214 Non-ST elevation (NSTEMI) myocardial infarction: Secondary | ICD-10-CM

## 2015-11-13 DIAGNOSIS — I5033 Acute on chronic diastolic (congestive) heart failure: Secondary | ICD-10-CM | POA: Insufficient documentation

## 2015-11-13 HISTORY — PX: CARDIAC CATHETERIZATION: SHX172

## 2015-11-13 LAB — HEPARIN LEVEL (UNFRACTIONATED)
HEPARIN UNFRACTIONATED: 0.21 [IU]/mL — AB (ref 0.30–0.70)
HEPARIN UNFRACTIONATED: 0.6 [IU]/mL (ref 0.30–0.70)

## 2015-11-13 LAB — CBC
HCT: 35.7 % — ABNORMAL LOW (ref 39.0–52.0)
HEMOGLOBIN: 11.7 g/dL — AB (ref 13.0–17.0)
MCH: 33.2 pg (ref 26.0–34.0)
MCHC: 32.8 g/dL (ref 30.0–36.0)
MCV: 101.4 fL — ABNORMAL HIGH (ref 78.0–100.0)
Platelets: 229 10*3/uL (ref 150–400)
RBC: 3.52 MIL/uL — AB (ref 4.22–5.81)
RDW: 14.4 % (ref 11.5–15.5)
WBC: 12.6 10*3/uL — AB (ref 4.0–10.5)

## 2015-11-13 LAB — LACTIC ACID, PLASMA
LACTIC ACID, VENOUS: 1.1 mmol/L (ref 0.5–2.0)
Lactic Acid, Venous: 1.1 mmol/L (ref 0.5–2.0)

## 2015-11-13 LAB — TYPE AND SCREEN
ABO/RH(D): O NEG
ANTIBODY SCREEN: NEGATIVE

## 2015-11-13 LAB — BASIC METABOLIC PANEL
Anion gap: 10 (ref 5–15)
BUN: 42 mg/dL — ABNORMAL HIGH (ref 6–20)
CHLORIDE: 100 mmol/L — AB (ref 101–111)
CO2: 29 mmol/L (ref 22–32)
CREATININE: 1.43 mg/dL — AB (ref 0.61–1.24)
Calcium: 9.1 mg/dL (ref 8.9–10.3)
GFR calc non Af Amer: 43 mL/min — ABNORMAL LOW (ref >60)
GFR, EST AFRICAN AMERICAN: 50 mL/min — AB (ref >60)
Glucose, Bld: 129 mg/dL — ABNORMAL HIGH (ref 65–99)
Potassium: 3.8 mmol/L (ref 3.5–5.1)
SODIUM: 139 mmol/L (ref 135–145)

## 2015-11-13 LAB — POCT I-STAT 3, ART BLOOD GAS (G3+)
ACID-BASE EXCESS: 4 mmol/L — AB (ref 0.0–2.0)
BICARBONATE: 27.3 meq/L — AB (ref 20.0–24.0)
O2 Saturation: 84 %
TCO2: 28 mmol/L (ref 0–100)
pCO2 arterial: 34.8 mmHg — ABNORMAL LOW (ref 35.0–45.0)
pH, Arterial: 7.504 — ABNORMAL HIGH (ref 7.350–7.450)
pO2, Arterial: 44 mmHg — ABNORMAL LOW (ref 80.0–100.0)

## 2015-11-13 LAB — GLUCOSE, CAPILLARY
GLUCOSE-CAPILLARY: 127 mg/dL — AB (ref 65–99)
Glucose-Capillary: 102 mg/dL — ABNORMAL HIGH (ref 65–99)
Glucose-Capillary: 136 mg/dL — ABNORMAL HIGH (ref 65–99)
Glucose-Capillary: 141 mg/dL — ABNORMAL HIGH (ref 65–99)
Glucose-Capillary: 155 mg/dL — ABNORMAL HIGH (ref 65–99)
Glucose-Capillary: 193 mg/dL — ABNORMAL HIGH (ref 65–99)

## 2015-11-13 LAB — STREP PNEUMONIAE URINARY ANTIGEN: Strep Pneumo Urinary Antigen: NEGATIVE

## 2015-11-13 LAB — URINE CULTURE: CULTURE: NO GROWTH

## 2015-11-13 LAB — PROTIME-INR
INR: 1.32 (ref 0.00–1.49)
PROTHROMBIN TIME: 16.5 s — AB (ref 11.6–15.2)

## 2015-11-13 LAB — PROCALCITONIN: Procalcitonin: <0.1 ng/mL

## 2015-11-13 LAB — POCT ACTIVATED CLOTTING TIME: Activated Clotting Time: 157 seconds

## 2015-11-13 SURGERY — LEFT HEART CATH AND CORS/GRAFTS ANGIOGRAPHY
Anesthesia: LOCAL

## 2015-11-13 MED ORDER — CEFTAZIDIME 1 G IJ SOLR
1.0000 g | Freq: Two times a day (BID) | INTRAMUSCULAR | Status: DC
  Administered 2015-11-13 – 2015-11-16 (×7): 1 g via INTRAVENOUS
  Filled 2015-11-13 (×8): qty 1

## 2015-11-13 MED ORDER — VANCOMYCIN HCL IN DEXTROSE 1-5 GM/200ML-% IV SOLN
1000.0000 mg | INTRAVENOUS | Status: AC
  Administered 2015-11-13: 1000 mg via INTRAVENOUS
  Filled 2015-11-13: qty 200

## 2015-11-13 MED ORDER — SODIUM CHLORIDE 0.9 % IV SOLN
INTRAVENOUS | Status: DC
  Administered 2015-11-13: 16:00:00 via INTRAVENOUS

## 2015-11-13 MED ORDER — VANCOMYCIN HCL IN DEXTROSE 750-5 MG/150ML-% IV SOLN
750.0000 mg | Freq: Two times a day (BID) | INTRAVENOUS | Status: DC
  Administered 2015-11-13 – 2015-11-15 (×5): 750 mg via INTRAVENOUS
  Filled 2015-11-13 (×7): qty 150

## 2015-11-13 MED ORDER — FUROSEMIDE 10 MG/ML IJ SOLN
20.0000 mg | Freq: Once | INTRAMUSCULAR | Status: AC
  Administered 2015-11-13: 20 mg via INTRAVENOUS
  Filled 2015-11-13: qty 2

## 2015-11-13 MED ORDER — LIDOCAINE HCL (PF) 1 % IJ SOLN
INTRAMUSCULAR | Status: DC | PRN
  Administered 2015-11-13: 15:00:00

## 2015-11-13 MED ORDER — SODIUM CHLORIDE 0.9 % IJ SOLN
3.0000 mL | Freq: Two times a day (BID) | INTRAMUSCULAR | Status: DC
  Administered 2015-11-13 – 2015-11-16 (×4): 3 mL via INTRAVENOUS

## 2015-11-13 MED ORDER — ENOXAPARIN SODIUM 30 MG/0.3ML ~~LOC~~ SOLN
30.0000 mg | SUBCUTANEOUS | Status: DC
  Administered 2015-11-14 – 2015-11-18 (×5): 30 mg via SUBCUTANEOUS
  Filled 2015-11-13 (×5): qty 0.3

## 2015-11-13 MED ORDER — HEPARIN BOLUS VIA INFUSION
2000.0000 [IU] | Freq: Once | INTRAVENOUS | Status: AC
  Administered 2015-11-13: 2000 [IU] via INTRAVENOUS
  Filled 2015-11-13: qty 2000

## 2015-11-13 MED ORDER — GUAIFENESIN ER 600 MG PO TB12
600.0000 mg | ORAL_TABLET | Freq: Two times a day (BID) | ORAL | Status: DC
Start: 2015-11-13 — End: 2015-11-15
  Administered 2015-11-13 – 2015-11-15 (×5): 600 mg via ORAL
  Filled 2015-11-13 (×5): qty 1

## 2015-11-13 MED ORDER — SODIUM CHLORIDE 0.9 % IJ SOLN: 3.0000 mL | INTRAMUSCULAR | Status: DC | PRN

## 2015-11-13 MED ORDER — ONDANSETRON HCL 4 MG/2ML IJ SOLN: 4.0000 mg | Freq: Four times a day (QID) | INTRAMUSCULAR | Status: DC | PRN

## 2015-11-13 MED ORDER — ACETAMINOPHEN 325 MG PO TABS
650.0000 mg | ORAL_TABLET | Freq: Four times a day (QID) | ORAL | Status: DC | PRN
  Administered 2015-11-13: 650 mg via ORAL
  Filled 2015-11-13: qty 2

## 2015-11-13 MED ORDER — SODIUM CHLORIDE 0.9 % IV SOLN: 250.0000 mL | INTRAVENOUS | Status: DC | PRN

## 2015-11-13 MED ORDER — IOHEXOL 350 MG/ML SOLN
INTRAVENOUS | Status: DC | PRN
  Administered 2015-11-13: 100 mL via INTRA_ARTERIAL

## 2015-11-13 MED ORDER — IPRATROPIUM-ALBUTEROL 0.5-2.5 (3) MG/3ML IN SOLN
3.0000 mL | RESPIRATORY_TRACT | Status: DC | PRN
  Administered 2015-11-14 – 2015-11-20 (×4): 3 mL via RESPIRATORY_TRACT
  Filled 2015-11-13 (×4): qty 3

## 2015-11-13 MED ORDER — LIDOCAINE HCL (PF) 1 % IJ SOLN
INTRAMUSCULAR | Status: AC
  Filled 2015-11-13: qty 30

## 2015-11-13 MED ORDER — ACETAMINOPHEN 325 MG PO TABS
650.0000 mg | ORAL_TABLET | ORAL | Status: DC | PRN
  Administered 2015-11-14 – 2015-11-16 (×3): 650 mg via ORAL
  Filled 2015-11-13 (×3): qty 2

## 2015-11-13 SURGICAL SUPPLY — 11 items
CATH INFINITI 5 FR IM (CATHETERS) ×2 IMPLANT
CATH INFINITI 5FR MULTPACK ANG (CATHETERS) ×2 IMPLANT
HOVERMATT SINGLE USE (MISCELLANEOUS) ×2 IMPLANT
KIT HEART LEFT (KITS) ×2 IMPLANT
PACK CARDIAC CATHETERIZATION (CUSTOM PROCEDURE TRAY) ×2 IMPLANT
SHEATH PINNACLE 5F 10CM (SHEATH) ×2 IMPLANT
SYR MEDRAD MARK V 150ML (SYRINGE) ×2 IMPLANT
TRANSDUCER W/STOPCOCK (MISCELLANEOUS) ×2 IMPLANT
WIRE EMERALD 3MM-J .035X150CM (WIRE) ×2 IMPLANT
WIRE EMERALD 3MM-J .035X260CM (WIRE) ×2 IMPLANT
WIRE SAFE-T 1.5MM-J .035X260CM (WIRE) ×2 IMPLANT

## 2015-11-13 NOTE — Progress Notes (Signed)
[facility] Progress Note    Patient: [name]    WUJ:[id number]  DOB: [birth year]     DOA: 11/11/2015 Date of Service: the patient was seen and examined on 11/13/2015  Subjective: Patient complains of some shortness of breath. RN reported that the patient was more confused than his baseline. Nutrition: Able to tolerate oral diet has been nothing by mouth overnight Activity: Mostly bedbound Last BM: Prior to admission  Assessment and Plan: 1. CAP (community acquired pneumonia) Influenza PCR negative, blood culture so far negative, urine culture no reports. No sputum culture. Chest x-ray shows vascular congestion. Although the patient has fever and leukocytosis today. Patient has been on levofloxacin since admission. Due to increased hypoxia the patient will be transitioned to vancomycin and [place]. Recheck pro-calcitonin level and lactic acid level are not elevated. We'll monitor clinically and de-escalate antibiotic depending on improvement. Duo nebs, Mucinex, after device, BiPAP as needed.  2. Acute on chronic CHF. Mostly diastolic based on the echocardiogram.  Responding to IV Lasix at present. Next and monitor ins and outs and daily weight. Primary management as per cardiology  3. Non-STEMI. Patient underwent cardiac catheterization Shows multivessel disease. Plan is medical management as well as aspirin and Plavix. Currently on heparin. Management as per cardiology.  4. Chronic kidney disease stage III. Patient will be undergoing cardiac catheterization with contrast. monitor urine output as well as renal function.  5 Diabetes mellitus with neuropathy. On insulin Lantus 20 units daily at bedtime, and moderate sliding scale.  Continue gabapentin for neuropathy. Check hemoglobin A1c in the morning.  6 Dyslipidemia. On Lipitor 80.  7 GERD. Continuing PPI.  8 Pressure ulcer. No open ulcers. Continue every 2 hours turns.   DVT Prophylaxis: on  therapeutic anticoagulation. Nutrition: Cardiac and diabetic diet Advance goals of care discussion: Full code as per my discussion with patient's wife as well as patient.  Brief Summary of Hospitalization:  HPI: As per the H and P dictated on admission, "Dylan BennettRichard J Kister is a 81 y.o. male with a past medical history significant for HFpEF, CAD s/p CABG, HTN, and IDDM who presents with fever, confusion, and cough.  History is collected from the patient and his wife, both of whom are judged to be poor historians. They report that over the last one month the patient has had increasing dyspnea on exertion and breathing difficulty. They are unclear of the reason, but yesterday they went to his PCP who prescribed home oxygen 2 L by nasal cannula. Today the patient developed a fever and seemed more confused than usual to his wife brought him to the ER. Because of respiratory difficulty the patient was administered DuoNeb nebs and Solu-Medrol by EMS on route.  In the ED, the patient had a low-grade fever to 100 F, tachycardia, tachypnea, hypertension, and hypoxia requiring 4 L of supplemental oxygen. His renal function was normal but he had an elevated BUN to creatinine ratio. The lactic acid level was normal. He had an high normal WBC. His troponin was 2.2 ng per mL, and BNP was moderately elevated. Chest x-ray showed pulmonary edema. An ECG showed no ST elevations. In the ER the patient was administered Levaquin, heparin infusion was started, and TRH were asked to admit for presumed CAP and ACS." Daily update: Patient admitted on 11/12/2015, started on antibiotics. Repeat troponin significantly elevated. Cartilage was consulted. Patient was [facility]12[facility]2015. Patient underwent cardiac catheterization on 11/13/2015. Consultants: Cardiology Procedures: Cardiac catheterization Echocardiogram Antibiotics:  Anti-infectives    Start     Dose/Rate Route Frequency Ordered Stop    11/13/15 2300  levofloxacin (LEVAQUIN) IVPB 750 mg  Status:  Discontinued     750 mg 100 mL/hr over 90 Minutes Intravenous Every 24 hours 11/12/15 0315 11/12/15 0323   11/13/15 2200  levofloxacin (LEVAQUIN) IVPB 750 mg  Status:  Discontinued     750 mg 100 mL/hr over 90 Minutes Intravenous Every 48 hours 11/12/15 0323 11/13/15 0922   11/13/15 2200  vancomycin (VANCOCIN) IVPB 750 mg/150 ml premix     750 mg 150 mL/hr over 60 Minutes Intravenous Every 12 hours 11/13/15 0921     11/13/15 1000  cefTAZidime (FORTAZ) 1 g in dextrose 5 % 50 mL IVPB     1 g 100 mL/hr over 30 Minutes Intravenous Every 12 hours 11/13/15 0931     11/13/15 0930  vancomycin (VANCOCIN) IVPB 1000 mg/200 mL premix     1,000 mg 200 mL/hr over 60 Minutes Intravenous NOW 11/13/15 0921 11/13/15 1115   11/11/15 2315  levofloxacin (LEVAQUIN) tablet 750 mg     750 mg Oral  Once 11/11/15 2312 11/11/15 2340       Family Communication: family was present at bedside, at the time of interview.  Opportunity was given to ask question and all questions were answered satisfactorily.   Disposition:  Barriers to safe discharge: Respiratory failure   Intake/Output Summary (Last 24 hours) at 11/13/15 1751 Last data filed at 11/13/15 1700  Gross per 24 hour  Intake 643.55 ml  Output   2750 ml  Net -2106.45 ml   Filed Weights   11/12/15 0500 11/12/15 1935 11/13/15 0406  Weight: 91.8 kg (202 lb 6.1 oz) 89.6 kg (197 lb 8.5 oz) 89.4 kg (197 lb 1.5 oz)    Objective: Physical Exam: Filed Vitals:   11/13/15 1630 11/13/15 1645 11/13/15 1700 11/13/15 1715  BP: 105/43 143/56 112/51 111/46  Pulse: 55 63 57 55  Temp:      TempSrc:      Resp: 24   28  Height:      Weight:      SpO2: 93% 92% 96% 96%     General: Appear in mild distress, no Rash; Oral Mucosa moist. Cardiovascular: S1 and S2 Present, no Murmur, difficult to assess JVD Respiratory: Bilateral Air entry present and bilateral extensive Crackles, no wheezes Abdomen:  Bowel Sound present , Soft and non- tenderness Extremities: Trace Pedal edema, no calf tenderness Neurology: Grossly no focal neuro deficit.  Data Reviewed: CBC:  Recent Labs Lab 11/11/15 2145 11/12/15 0400 11/13/15 0226  WBC 10.3 7.5 12.6*  NEUTROABS 6.9  --   --   HGB 12.6* 11.9* 11.7*  HCT 38.5* 35.5* 35.7*  MCV 102.9* 102.3* 101.4*  PLT 247 230 229   Basic Metabolic Panel:  Recent Labs Lab 11/11/15 2145 11/12/15 0400 11/13/15 0226  NA 142  --  139  K 4.3  --  3.8  CL 105  --  100*  CO2 28  --  29  GLUCOSE 253*  --  129*  BUN 35*  --  42*  CREATININE 1.34* 1.39* 1.43*  CALCIUM 9.6  --  9.1   Liver Function Tests:  Recent Labs Lab 11/11/15 2145  AST 27  ALT 14*  ALKPHOS 42  BILITOT 0.7  PROT 7.1  ALBUMIN 4.0   No results for input(s): LIPASE, AMYLASE in the last 168 hours. No results for input(s): AMMONIA in the last 168  hours.  Cardiac Enzymes:  Recent Labs Lab 11/11/15 2227 11/12/15 0335 11/12/15 0819 11/12/15 1441  TROPONINI 2.20* 18.18* 25.52* 25.58*   BNP (last 3 results)  Recent Labs  11/11/15 2227  BNP 414.8*    ProBNP (last 3 results) No results for input(s): PROBNP in the last 8760 hours.   CBG:  Recent Labs Lab 11/12/15 2359 11/13/15 0401 11/13/15 0746 11/13/15 1114 11/13/15 1517  GLUCAP 193* 136* 102* 155* 127*    Recent Results (from the past 240 hour(s))  Culture, blood (routine x 2)     Status: None (Preliminary result)   Collection Time: 11/11/15  9:45 PM  Result Value Ref Range Status   Specimen Description BLOOD RIGHT ANTECUBITAL  Final   Special Requests BOTTLES DRAWN AEROBIC AND ANAEROBIC  Final   Culture   Final    NO GROWTH 2 DAYS Performed at [facility]    Report Status PENDING  Incomplete  Urine culture     Status: None   Collection Time: 11/12/15 12:28 AM  Result Value Ref Range Status   Specimen Description URINE, RANDOM  Final   Special Requests NONE  Final   Culture   Final     NO GROWTH 1 DAY Performed at [facility]    Report Status 11/13/2015 FINAL  Final  MRSA PCR Screening     Status: None   Collection Time: 11/12/15  2:56 AM  Result Value Ref Range Status   MRSA by PCR NEGATIVE NEGATIVE Final    Comment:        The GeneXpert MRSA Assay (FDA approved for NASAL specimens only), is one component of a comprehensive MRSA colonization surveillance program. It is not intended to diagnose MRSA infection nor to guide or monitor treatment for MRSA infections.      Studies: Ct Head Wo Contrast  11/13/2015  CLINICAL DATA:  Altered mental status.  Impaired orientation EXAM: CT HEAD WITHOUT CONTRAST TECHNIQUE: Contiguous axial images were obtained from the base of the skull through the vertex without intravenous contrast. COMPARISON:  08/13/2006 FINDINGS: Skull and Sinuses:Negative for fracture or destructive process. Mucosal thickening focally in the right maxillary antrum, without fluid level and presumed incidental given the history. Visualized orbits: Bilateral cataract resection Brain: No evidence of acute infarction, hemorrhage, hydrocephalus, or mass lesion/mass effect. Generalized cerebral volume loss which is normal for age. Normal cerebral white matter for age. IMPRESSION: 1. Normal intracranial imaging for age. 2. Chronic right maxillary sinusitis. Electronically Signed   By: Marnee Spring M.D.   On: 11/13/2015 04:53   Dg Chest Port 1 View  11/13/2015  CLINICAL DATA:  Chest pain EXAM: PORTABLE CHEST - 1 VIEW COMPARISON:  11/11/2015 FINDINGS: Cardiac shadow is mildly enlarged but stable. Postoperative changes are again seen. Increasing bilateral infiltrates are noted consistent with progressive pulmonary edema. Elevation of the right hemidiaphragm is again seen. IMPRESSION: Increasing pulmonary edema bilaterally. Electronically Signed   By: Alcide Clever M.D.   On: 11/13/2015 08:29     Scheduled Meds: . amLODipine  2.5 mg Oral Daily  .  antiseptic oral rinse  7 mL Mouth Rinse BID  . aspirin EC  81 mg Oral Daily  . atenolol  50 mg Oral Daily  . atorvastatin  80 mg Oral q1800  . cefTAZidime (FORTAZ)  IV  1 g Intravenous Q12H  . [START ON 11/14/2015] enoxaparin (LOVENOX) injection  30 mg Subcutaneous Q24H  . furosemide  10 mg Oral Daily  . gabapentin  300 mg Oral BID  . guaiFENesin  600 mg Oral BID  . insulin aspart  0-15 Units Subcutaneous 6 times per day  . insulin glargine  20 Units Subcutaneous QHS  . isosorbide mononitrate  30 mg Oral Daily  . lisinopril  20 mg Oral Daily  . pantoprazole  40 mg Oral Daily  . ranolazine  1,000 mg Oral BID  . sodium chloride  3 mL Intravenous Q12H  . sodium chloride  3 mL Intravenous Q12H  . vancomycin  750 mg Intravenous Q12H   Continuous Infusions: . sodium chloride 100 mL/hr at 11/13/15 1600   PRN Meds: sodium chloride, acetaminophen, ipratropium-albuterol, nitroGLYCERIN, ondansetron (ZOFRAN) IV, sodium chloride  Time spent: 35 minutes  Author: Lynden Oxford, MD [facility] Pager: [phone] 11/13/2015 5:51 PM  If 7PM-7AM, please contact night-coverage at [email or web address], password [facility]

## 2015-11-13 NOTE — H&P (View-Only) (Signed)
Patient Name: [name] Date of Encounter: 11/13/2015     Principal Problem:   CAP (community acquired pneumonia) Active Problems:   Essential hypertension   Diabetes mellitus with neurological manifestation (HCC)   CHF (congestive heart failure) (HCC)   Elevated troponin   CKD (chronic kidney disease), stage III   Altered mental state   NSTEMI (non-ST elevated myocardial infarction) (HCC)   Acute congestive heart failure (HCC)   Pressure ulcer    SUBJECTIVE  This morning the patient is running a low-grade fever.  He has no chest pain but is having some shortness of breath.  He is mildly confused.  Rhythm is normal sinus rhythm with PACs.  Echocardiogram yesterday shows ejection fraction 50-55% with inferolateral hypokinesis.  Technically limited study.  CURRENT MEDS . amLODipine  2.5 mg Oral Daily  . antiseptic oral rinse  7 mL Mouth Rinse BID  . aspirin  81 mg Oral Pre-Cath  . aspirin EC  81 mg Oral Daily  . atenolol  50 mg Oral Daily  . atorvastatin  80 mg Oral q1800  . furosemide  10 mg Oral Daily  . gabapentin  300 mg Oral BID  . insulin aspart  0-15 Units Subcutaneous 6 times per day  . insulin glargine  20 Units Subcutaneous QHS  . isosorbide mononitrate  30 mg Oral Daily  . levofloxacin (LEVAQUIN) IV  750 mg Intravenous Q48H  . lisinopril  20 mg Oral Daily  . pantoprazole  40 mg Oral Daily  . ranolazine  1,000 mg Oral BID  . sodium chloride  3 mL Intravenous Q12H  . sodium chloride  3 mL Intravenous Q12H    OBJECTIVE  Filed Vitals:   11/13/15 0530 11/13/15 0600 11/13/15 0630 11/13/15 0700  BP: 153/58 129/114 161/59 168/61  Pulse: 86 84 83 87  Temp:    99.1 F (37.3 C)  TempSrc:    Oral  Resp: 30 32 22 28  Height:      Weight:      SpO2: 94% 91% 92% 92%    Intake/Output Summary (Last 24 hours) at 11/13/15 0746 Last data filed at 11/13/15 0656  Gross per 24 hour  Intake 262.35 ml  Output   1225 ml  Net -962.65 ml   Filed Weights   11/12/15 0500 11/12/15 1935 11/13/15 0406  Weight: 202 lb 6.1 oz (91.8 kg) 197 lb 8.5 oz (89.6 kg) 197 lb 1.5 oz (89.4 kg)    PHYSICAL EXAM  General: Pleasant, NAD.  Mildly confused Neuro: Alert and oriented X 1. Moves all extremities spontaneously. Psych: Normal affect. HEENT:  Normal  Neck: Supple without bruits or JVD. Lungs:  Resp regular and unlabored, bilateral rales and rhonchi Heart: RRR no s3, s4, or murmurs. Abdomen: Soft, non-tender, non-distended, BS + x 4.  Extremities: No clubbing, cyanosis or edema. DP/PT/Radials 2+ and equal bilaterally.  One plus pedal pulses  Accessory Clinical Findings  CBC  Recent Labs  11/11/15 2145 11/12/15 0400 11/13/15 0226  WBC 10.3 7.5 12.6*  NEUTROABS 6.9  --   --   HGB 12.6* 11.9* 11.7*  HCT 38.5* 35.5* 35.7*  MCV 102.9* 102.3* 101.4*  PLT 247 230 229   Basic Metabolic Panel  Recent Labs  11/11/15 2145 11/12/15 0400 11/13/15 0226  NA 142  --  139  K 4.3  --  3.8  CL 105  --  100*  CO2 28  --  29  GLUCOSE 253*  --  129*  BUN  35*  --  42*  CREATININE 1.34* 1.39* 1.43*  CALCIUM 9.6  --  9.1   Liver Function Tests  Recent Labs  11/11/15 2145  AST 27  ALT 14*  ALKPHOS 42  BILITOT 0.7  PROT 7.1  ALBUMIN 4.0   No results for input(s): LIPASE, AMYLASE in the last 72 hours. Cardiac Enzymes  Recent Labs  11/12/15 0335 11/12/15 0819 11/12/15 1441  TROPONINI 18.18* 25.52* 25.58*   BNP Invalid input(s): POCBNP D-Dimer No results for input(s): DDIMER in the last 72 hours. Hemoglobin A1C No results for input(s): HGBA1C in the last 72 hours. Fasting Lipid Panel No results for input(s): CHOL, HDL, LDLCALC, TRIG, CHOLHDL, LDLDIRECT in the last 72 hours. Thyroid Function Tests No results for input(s): TSH, T4TOTAL, T3FREE, THYROIDAB in the last 72 hours.  Invalid input(s): FREET3  TELE  Normal sinus rhythm.  Frequent PACs.  ECG  Repeat pending  Radiology/Studies  Dg Clavicle Left  11/07/2015   CLINICAL DATA:  Recent fall onto left shoulder with clavicular pain, initial encounter EXAM: LEFT CLAVICLE - 2+ VIEWS COMPARISON:  None. FINDINGS: There is no evidence of fracture or other focal bone lesions. Soft tissues are unremarkable. IMPRESSION: No acute abnormality noted. Electronically Signed   By: Alcide CleverMark  Lukens M.D.   On: 11/07/2015 19:25   Ct Head Wo Contrast  11/13/2015  CLINICAL DATA:  Altered mental status.  Impaired orientation EXAM: CT HEAD WITHOUT CONTRAST TECHNIQUE: Contiguous axial images were obtained from the base of the skull through the vertex without intravenous contrast. COMPARISON:  08/13/2006 FINDINGS: Skull and Sinuses:Negative for fracture or destructive process. Mucosal thickening focally in the right maxillary antrum, without fluid level and presumed incidental given the history. Visualized orbits: Bilateral cataract resection Brain: No evidence of acute infarction, hemorrhage, hydrocephalus, or mass lesion/mass effect. Generalized cerebral volume loss which is normal for age. Normal cerebral white matter for age. IMPRESSION: 1. Normal intracranial imaging for age. 2. Chronic right maxillary sinusitis. Electronically Signed   By: Marnee SpringJonathon  Watts M.D.   On: 11/13/2015 04:53   Dg Chest Port 1 View  11/11/2015  CLINICAL DATA:  Fever and wheezing EXAM: PORTABLE CHEST - 1 VIEW COMPARISON:  10/28/2011 FINDINGS: Cardiac shadow is again enlarged. Postsurgical changes are again seen. Elevation the right hemidiaphragm is noted. Vascular congestion with patchy edematous changes are seen this likely represents CHF although the possibility of superimposed infiltrate deserves consideration as well. IMPRESSION: Increased vascular congestion and patchy changes likely related to CHF. Electronically Signed   By: Alcide CleverMark  Lukens M.D.   On: 11/11/2015 21:50   Dg Shoulder Left  11/07/2015  CLINICAL DATA:  81 year old male with acute left shoulder pain following fall today. EXAM: LEFT SHOULDER - 2+  VIEW COMPARISON:  None. FINDINGS: There is no evidence of acute fracture, subluxation or dislocation. Degenerative changes at the glenohumeral joint noted. Visualized bony left hemi thorax is unremarkable. IMPRESSION: No acute bony abnormality. Electronically Signed   By: Harmon PierJeffrey  Hu M.D.   On: 11/07/2015 19:43    ASSESSMENT AND PLAN  1.  Non-STEMI.  Status post remote CABG  CAP (community acquired pneumonia) Active Problems:  Essential hypertension  Diabetes mellitus with neurological manifestation (HCC)  Acute diastolic heart failure with ejection fraction 50-55% by echo  Elevated troponin  CKD (chronic kidney disease), stage III  Altered mental state  Plan: Get follow-up portable chest x-ray today.  Follow-up EKG today.  We will give 20 mg Lasix IV now for pulmonary vascular congestion and dyspnea.  Left heart catheterization later today.  Awaiting wife to sign a permit. Signed, Ronny Flurry MD  Addendum: This morning he is more dyspneic.  Oxygen levels are low and he had to go on BiPAP.  Chest x-ray shows worsening bilateral pulmonary edema.  We have given him a total of 40 mg of additional IV Lasix this morning.  Previously he has had a good response to relatively low doses of Lasix.  His electrocardiogram shows no significant change in nonspecific T-wave changes since prior tracing.  No ST elevation to suggest STEMI.  Currently scheduled for cardiac catheterization today.  If respiratory status improves we will proceed with cardiac catheterization, otherwise he may need to be rescheduled for tomorrow after further diuresis.  He denies any chest pains

## 2015-11-13 NOTE — Progress Notes (Addendum)
Patient Name: [name] Date of Encounter: 11/13/2015     Principal Problem:   CAP (community acquired pneumonia) Active Problems:   Essential hypertension   Diabetes mellitus with neurological manifestation (HCC)   CHF (congestive heart failure) (HCC)   Elevated troponin   CKD (chronic kidney disease), stage III   Altered mental state   NSTEMI (non-ST elevated myocardial infarction) (HCC)   Acute congestive heart failure (HCC)   Pressure ulcer    SUBJECTIVE  This morning the patient is running a low-grade fever.  He has no chest pain but is having some shortness of breath.  He is mildly confused.  Rhythm is normal sinus rhythm with PACs.  Echocardiogram yesterday shows ejection fraction 50-55% with inferolateral hypokinesis.  Technically limited study.  CURRENT MEDS . amLODipine  2.5 mg Oral Daily  . antiseptic oral rinse  7 mL Mouth Rinse BID  . aspirin  81 mg Oral Pre-Cath  . aspirin EC  81 mg Oral Daily  . atenolol  50 mg Oral Daily  . atorvastatin  80 mg Oral q1800  . furosemide  10 mg Oral Daily  . gabapentin  300 mg Oral BID  . insulin aspart  0-15 Units Subcutaneous 6 times per day  . insulin glargine  20 Units Subcutaneous QHS  . isosorbide mononitrate  30 mg Oral Daily  . levofloxacin (LEVAQUIN) IV  750 mg Intravenous Q48H  . lisinopril  20 mg Oral Daily  . pantoprazole  40 mg Oral Daily  . ranolazine  1,000 mg Oral BID  . sodium chloride  3 mL Intravenous Q12H  . sodium chloride  3 mL Intravenous Q12H    OBJECTIVE  Filed Vitals:   11/13/15 0530 11/13/15 0600 11/13/15 0630 11/13/15 0700  BP: 153/58 129/114 161/59 168/61  Pulse: 86 84 83 87  Temp:    99.1 F (37.3 C)  TempSrc:    Oral  Resp: 30 32 22 28  Height:      Weight:      SpO2: 94% 91% 92% 92%    Intake/Output Summary (Last 24 hours) at 11/13/15 0746 Last data filed at 11/13/15 0656  Gross per 24 hour  Intake 262.35 ml  Output   1225 ml  Net -962.65 ml   Filed Weights   11/12/15 0500 11/12/15 1935 11/13/15 0406  Weight: 202 lb 6.1 oz (91.8 kg) 197 lb 8.5 oz (89.6 kg) 197 lb 1.5 oz (89.4 kg)    PHYSICAL EXAM  General: Pleasant, NAD.  Mildly confused Neuro: Alert and oriented X 1. Moves all extremities spontaneously. Psych: Normal affect. HEENT:  Normal  Neck: Supple without bruits or JVD. Lungs:  Resp regular and unlabored, bilateral rales and rhonchi Heart: RRR no s3, s4, or murmurs. Abdomen: Soft, non-tender, non-distended, BS + x 4.  Extremities: No clubbing, cyanosis or edema. DP/PT/Radials 2+ and equal bilaterally.  One plus pedal pulses  Accessory Clinical Findings  CBC  Recent Labs  11/11/15 2145 11/12/15 0400 11/13/15 0226  WBC 10.3 7.5 12.6*  NEUTROABS 6.9  --   --   HGB 12.6* 11.9* 11.7*  HCT 38.5* 35.5* 35.7*  MCV 102.9* 102.3* 101.4*  PLT 247 230 229   Basic Metabolic Panel  Recent Labs  11/11/15 2145 11/12/15 0400 11/13/15 0226  NA 142  --  139  K 4.3  --  3.8  CL 105  --  100*  CO2 28  --  29  GLUCOSE 253*  --  129*  BUN  35*  --  42*  CREATININE 1.34* 1.39* 1.43*  CALCIUM 9.6  --  9.1   Liver Function Tests  Recent Labs  11/11/15 2145  AST 27  ALT 14*  ALKPHOS 42  BILITOT 0.7  PROT 7.1  ALBUMIN 4.0   No results for input(s): LIPASE, AMYLASE in the last 72 hours. Cardiac Enzymes  Recent Labs  11/12/15 0335 11/12/15 0819 11/12/15 1441  TROPONINI 18.18* 25.52* 25.58*   BNP Invalid input(s): POCBNP D-Dimer No results for input(s): DDIMER in the last 72 hours. Hemoglobin A1C No results for input(s): HGBA1C in the last 72 hours. Fasting Lipid Panel No results for input(s): CHOL, HDL, LDLCALC, TRIG, CHOLHDL, LDLDIRECT in the last 72 hours. Thyroid Function Tests No results for input(s): TSH, T4TOTAL, T3FREE, THYROIDAB in the last 72 hours.  Invalid input(s): FREET3  TELE  Normal sinus rhythm.  Frequent PACs.  ECG  Repeat pending  Radiology/Studies  Dg Clavicle Left  11/07/2015   CLINICAL DATA:  Recent fall onto left shoulder with clavicular pain, initial encounter EXAM: LEFT CLAVICLE - 2+ VIEWS COMPARISON:  None. FINDINGS: There is no evidence of fracture or other focal bone lesions. Soft tissues are unremarkable. IMPRESSION: No acute abnormality noted. Electronically Signed   By: Alcide CleverMark  Lukens M.D.   On: 11/07/2015 19:25   Ct Head Wo Contrast  11/13/2015  CLINICAL DATA:  Altered mental status.  Impaired orientation EXAM: CT HEAD WITHOUT CONTRAST TECHNIQUE: Contiguous axial images were obtained from the base of the skull through the vertex without intravenous contrast. COMPARISON:  08/13/2006 FINDINGS: Skull and Sinuses:Negative for fracture or destructive process. Mucosal thickening focally in the right maxillary antrum, without fluid level and presumed incidental given the history. Visualized orbits: Bilateral cataract resection Brain: No evidence of acute infarction, hemorrhage, hydrocephalus, or mass lesion/mass effect. Generalized cerebral volume loss which is normal for age. Normal cerebral white matter for age. IMPRESSION: 1. Normal intracranial imaging for age. 2. Chronic right maxillary sinusitis. Electronically Signed   By: Marnee SpringJonathon  Watts M.D.   On: 11/13/2015 04:53   Dg Chest Port 1 View  11/11/2015  CLINICAL DATA:  Fever and wheezing EXAM: PORTABLE CHEST - 1 VIEW COMPARISON:  10/28/2011 FINDINGS: Cardiac shadow is again enlarged. Postsurgical changes are again seen. Elevation the right hemidiaphragm is noted. Vascular congestion with patchy edematous changes are seen this likely represents CHF although the possibility of superimposed infiltrate deserves consideration as well. IMPRESSION: Increased vascular congestion and patchy changes likely related to CHF. Electronically Signed   By: Alcide CleverMark  Lukens M.D.   On: 11/11/2015 21:50   Dg Shoulder Left  11/07/2015  CLINICAL DATA:  81 year old male with acute left shoulder pain following fall today. EXAM: LEFT SHOULDER - 2+  VIEW COMPARISON:  None. FINDINGS: There is no evidence of acute fracture, subluxation or dislocation. Degenerative changes at the glenohumeral joint noted. Visualized bony left hemi thorax is unremarkable. IMPRESSION: No acute bony abnormality. Electronically Signed   By: Harmon PierJeffrey  Hu M.D.   On: 11/07/2015 19:43    ASSESSMENT AND PLAN  1.  Non-STEMI.  Status post remote CABG  CAP (community acquired pneumonia) Active Problems:  Essential hypertension  Diabetes mellitus with neurological manifestation (HCC)  Acute diastolic heart failure with ejection fraction 50-55% by echo  Elevated troponin  CKD (chronic kidney disease), stage III  Altered mental state  Plan: Get follow-up portable chest x-ray today.  Follow-up EKG today.  We will give 20 mg Lasix IV now for pulmonary vascular congestion and dyspnea.  Left heart catheterization later today.  Awaiting wife to sign a permit. Signed, Cinch Ormond, Tom MD  Addendum: This morning he is more dyspneic.  Oxygen levels are low and he had to go on BiPAP.  Chest x-ray shows worsening bilateral pulmonary edema.  We have given him a total of 40 mg of additional IV Lasix this morning.  Previously he has had a good response to relatively low doses of Lasix.  His electrocardiogram shows no significant change in nonspecific T-wave changes since prior tracing.  No ST elevation to suggest STEMI.  Currently scheduled for cardiac catheterization today.  If respiratory status improves we will proceed with cardiac catheterization, otherwise he may need to be rescheduled for tomorrow after further diuresis.  He denies any chest pains 

## 2015-11-13 NOTE — Interval H&P Note (Signed)
Cath Lab Visit (complete for each Cath Lab visit)  Clinical Evaluation Leading to the Procedure:   ACS: Yes.    Non-ACS:    Anginal Classification: CCS IV  Anti-ischemic medical therapy: Maximal Therapy (2 or more classes of medications)  Non-Invasive Test Results: No non-invasive testing performed  Prior CABG: Previous CABG      History and Physical Interval Note:  11/13/2015 2:02 PM  [name]  has presented today for surgery, with the diagnosis of NSTEMI  The various methods of treatment have been discussed with the patient and family. After consideration of risks, benefits and other options for treatment, the patient has consented to  Procedure(s): Left Heart Cath and Cors/Grafts Angiography (N/Barron) as Barron surgical intervention .  The patient's history has been reviewed, patient examined, no change in status, stable for surgery.  I have reviewed the patient's chart and labs.  Questions were answered to the patient's satisfaction.     [name]

## 2015-11-13 NOTE — Progress Notes (Signed)
ANTICOAGULATION CONSULT NOTE - Follow Up Consult  Pharmacy Consult for Heparin  Indication: chest pain/ACS  Allergies  Allergen Reactions  . Morphine And Related     Altered mental status  . Tetracyclines & Related     "Raw throat"    Patient Measurements: Height: 5\' 9"  (175.3 cm) Weight: 197 lb 8.5 oz (89.6 kg) IBW/kg (Calculated) : 70.7  Vital Signs: Temp: 98.9 F (37.2 C) (12/22 0030) Temp Source: Oral (12/22 0030) BP: 163/70 mmHg (12/22 0300) Pulse Rate: 95 (12/22 0300)  Labs:  Recent Labs  11/11/15 2145  11/12/15 0335 11/12/15 0400 11/12/15 0819 11/12/15 1441 11/13/15 0226  HGB 12.6*  --   --  11.9*  --   --  11.7*  HCT 38.5*  --   --  35.5*  --   --  35.7*  PLT 247  --   --  230  --   --  229  APTT 30  --   --   --   --   --   --   LABPROT 14.8  --   --   --   --   --  16.5*  INR 1.15  --   --   --   --   --  1.32  HEPARINUNFRC  --   --   --   --  0.37 0.29* 0.21*  CREATININE 1.34*  --   --  1.39*  --   --   --   TROPONINI  --   < > 18.18*  --  25.52* 25.58*  --   < > = values in this interval not displayed.  Estimated Creatinine Clearance: 43 mL/min (by C-G formula based on Cr of 1.39).   Ass[facility]: 81 [facility], troponin markedly elevated, heparin level is low despite rate increase, no issues per RN.   Goal of Therapy:  Heparin level 0.3-0.7 units/ml Monitor platelets by anticoagulation protocol: Yes   Plan:  -Heparin 2000 units BOLUS -Increase heparin drip to 1200 units/hr -1200 HL or f/u post-cath, whichever is first -Monitor for bleeding  Abran DukeLedford, Levie Wages 11/13/2015,3:54 AM

## 2015-11-13 NOTE — Progress Notes (Signed)
ANTICOAGULATION and ANTIBIOTIC CONSULT NOTE - Follow Up Consult  Pharmacy Consult for Heparin, Vancomycin, Elita QuickFortaz  Indication: chest pain/ACS, suspected CAP  Allergies  Allergen Reactions  . Morphine And Related     Altered mental status  . Tetracyclines & Related     "Raw throat"    Patient Measurements: Height: 5\' 9"  (175.3 cm) Weight: 197 lb 1.5 oz (89.4 kg) IBW/kg (Calculated) : 70.7  Vital Signs: Temp: 101.8 F (38.8 C) (12/22 1114) Temp Source: Axillary (12/22 1114) BP: 99/52 mmHg (12/22 1200) Pulse Rate: 57 (12/22 1200)  Labs:  Recent Labs  11/11/15 2145  11/12/15 0335 11/12/15 0400  11/12/15 0819 11/12/15 1441 11/13/15 0226 11/13/15 1140  HGB 12.6*  --   --  11.9*  --   --   --  11.7*  --   HCT 38.5*  --   --  35.5*  --   --   --  35.7*  --   PLT 247  --   --  230  --   --   --  229  --   APTT 30  --   --   --   --   --   --   --   --   LABPROT 14.8  --   --   --   --   --   --  16.5*  --   INR 1.15  --   --   --   --   --   --  1.32  --   HEPARINUNFRC  --   --   --   --   < > 0.37 0.29* 0.21* 0.60  CREATININE 1.34*  --   --  1.39*  --   --   --  1.43*  --   TROPONINI  --   < > 18.18*  --   --  25.52* 25.58*  --   --   < > = values in this interval not displayed.  Estimated Creatinine Clearance: 41.8 mL/min (by C-G formula based on Cr of 1.43).   Assessment: 81 y/o M [facility]. Troponin markedly elevated to 25. Pharmacy dosing heparin for ACS. Hgb 11.7, Plt 229. Heparin level today after bolus and rate increase is 0.6.   Pt now with increased WBC, febrile at 101.8, CXR showing pulmonary edema consistent w/ CHF. Pt also has AKI w/ SCR 1.34 > 1.43, CrCl 41. Pt was initiated on levofloxacin, antibiotics now changed to vancomycin and ceftazidime.  Levaquin 12/20>>12/22  Vanc 12/22>>  Elita QuickFortaz 12/22>>   12/21 BCx x2: NG<12hr  12/21 UCx: NG  MRSA PCR: neg  Flu panel: neg   Goal of Therapy:  Heparin level 0.3-0.7 units/ml Monitor  platelets by anticoagulation protocol: Yes   Plan:  - Continue heparin drip at 1200 units/hr - Daily heparin level and CBC - f/u post-cath; patient may not go today given concern for infection - Monitor for bleeding  - Vancomycin 1 g IV x1, then 750 mg IV q12h - Trough at Css if therapy to continue - Fortaz 1 g IV q12h - F/U cultures, LOT, renal fxn, clinical improvement  Arcola JanskyMeagan Belina Mandile, PharmD Clinical Pharmacy Resident Pager: [phone] 11/13/2015,1:03 PM

## 2015-11-13 NOTE — Progress Notes (Signed)
Site area: Right groin a 5 french arterial sheath was removed  Site Prior to Removal:  Level 0  Pressure Applied For 15 MINUTES    Minutes Beginning at 1515p  Manual:   Yes.    Patient Status During Pull:  stable  Post Pull Groin Site:  Level 0  Post Pull Instructions Given:  Yes.    Post Pull Pulses Present:  Yes.    Dressing Applied:  Yes.    Comments:  VS remain stable during sheath pull.

## 2015-11-13 NOTE — Clinical Documentation Improvement (Signed)
Cardiology  A cause and effect relationship may not be assumed and must be documented by a provider.  Please clarify the relationship, if any, between CKD 3 and Diabetes Mellitus 2.   Are the conditions:   CKD 3 due to  DM 2    CKD 3 or DM 2 Unrelated to each other   Unable to determine   Unknown       Please exercise your independent, professional judgment when responding. A specific answer is not anticipated or expected.   Thank You, Lavonda JumboLawanda J Taylor [facility] [employer] [facility] [place] [phone]

## 2015-11-13 NOTE — Clinical Documentation Improvement (Signed)
Cardiology  Please clarify if the following diagnosis, "Acute on Chronic systolic heart failure" was:   Present at the time of admission (POA)  NOT present at the time of admission and it developed during the inpatient stay  Unable to clinically determine whether the condition was present on admission.  Unknown   Supporting Information:  bnp 414.8  ED doc "acute congested heart failure"   Please exercise your independent, professional judgment when responding. A specific answer is not anticipated or expected.   Thank You,  Lavonda JumboLawanda J Ivyana Locey [employer] Information Management [place] [phone]

## 2015-11-14 ENCOUNTER — Inpatient Hospital Stay (HOSPITAL_COMMUNITY): Payer: Medicare Other

## 2015-11-14 ENCOUNTER — Encounter (HOSPITAL_COMMUNITY): Payer: Self-pay | Admitting: Cardiovascular Disease

## 2015-11-14 LAB — LEGIONELLA ANTIGEN, URINE

## 2015-11-14 LAB — EXPECTORATED SPUTUM ASSESSMENT W GRAM STAIN, RFLX TO RESP C

## 2015-11-14 LAB — BASIC METABOLIC PANEL
ANION GAP: 9 (ref 5–15)
BUN: 42 mg/dL — ABNORMAL HIGH (ref 6–20)
CALCIUM: 8.4 mg/dL — AB (ref 8.9–10.3)
CO2: 29 mmol/L (ref 22–32)
Chloride: 102 mmol/L (ref 101–111)
Creatinine, Ser: 1.48 mg/dL — ABNORMAL HIGH (ref 0.61–1.24)
GFR calc non Af Amer: 41 mL/min — ABNORMAL LOW (ref >60)
GFR, EST AFRICAN AMERICAN: 48 mL/min — AB (ref >60)
Glucose, Bld: 102 mg/dL — ABNORMAL HIGH (ref 65–99)
Potassium: 3.4 mmol/L — ABNORMAL LOW (ref 3.5–5.1)
Sodium: 140 mmol/L (ref 135–145)

## 2015-11-14 LAB — GLUCOSE, CAPILLARY
GLUCOSE-CAPILLARY: 97 mg/dL (ref 65–99)
GLUCOSE-CAPILLARY: 115 mg/dL — AB (ref 65–99)
GLUCOSE-CAPILLARY: 178 mg/dL — AB (ref 65–99)
Glucose-Capillary: 161 mg/dL — ABNORMAL HIGH (ref 65–99)
Glucose-Capillary: 225 mg/dL — ABNORMAL HIGH (ref 65–99)

## 2015-11-14 LAB — CBC
HEMATOCRIT: 33.5 % — AB (ref 39.0–52.0)
HEMOGLOBIN: 11.5 g/dL — AB (ref 13.0–17.0)
MCH: 34.5 pg — ABNORMAL HIGH (ref 26.0–34.0)
MCHC: 34.3 g/dL (ref 30.0–36.0)
MCV: 100.6 fL — ABNORMAL HIGH (ref 78.0–100.0)
Platelets: 177 10*3/uL (ref 150–400)
RBC: 3.33 MIL/uL — ABNORMAL LOW (ref 4.22–5.81)
RDW: 14.3 % (ref 11.5–15.5)
WBC: 9.4 10*3/uL (ref 4.0–10.5)

## 2015-11-14 LAB — PROCALCITONIN: PROCALCITONIN: 0.1 ng/mL

## 2015-11-14 MED ORDER — POTASSIUM CHLORIDE CRYS ER 20 MEQ PO TBCR
20.0000 meq | EXTENDED_RELEASE_TABLET | Freq: Two times a day (BID) | ORAL | Status: DC
  Administered 2015-11-14 – 2015-11-15 (×3): 20 meq via ORAL
  Filled 2015-11-14 (×3): qty 1

## 2015-11-14 MED ORDER — CLOPIDOGREL BISULFATE 300 MG PO TABS
300.0000 mg | ORAL_TABLET | Freq: Once | ORAL | Status: AC
  Administered 2015-11-14: 300 mg via ORAL
  Filled 2015-11-14: qty 1

## 2015-11-14 MED ORDER — SODIUM CHLORIDE 0.9 % IV SOLN
INTRAVENOUS | Status: DC
  Administered 2015-11-14: 07:00:00 via INTRAVENOUS

## 2015-11-14 MED ORDER — FUROSEMIDE 10 MG/ML IJ SOLN
40.0000 mg | Freq: Two times a day (BID) | INTRAMUSCULAR | Status: DC
  Administered 2015-11-14 – 2015-11-15 (×3): 40 mg via INTRAVENOUS
  Filled 2015-11-14 (×6): qty 4

## 2015-11-14 MED ORDER — CLOPIDOGREL BISULFATE 75 MG PO TABS
75.0000 mg | ORAL_TABLET | Freq: Every day | ORAL | Status: DC
  Administered 2015-11-15 – 2015-11-24 (×10): 75 mg via ORAL
  Filled 2015-11-14 (×10): qty 1

## 2015-11-14 MED ORDER — BISACODYL 10 MG RE SUPP: 10.0000 mg | Freq: Every day | RECTAL | Status: DC | PRN

## 2015-11-14 NOTE — Progress Notes (Signed)
[facility] Progress Note    Patient: [name]    ZOX:[id number]  DOB: 30-Nov-1927     DOA: 11/11/2015 Date of Service: the patient was seen and examined on 11/14/2015  Subjective: Continues to have fever, symptomatically similar to yesterday. Tolerated procedure well. No chest pain no nausea no vomiting. Nutrition: Able to tolerate oral diet Activity: Mostly bedbound Last BM: Prior to admission  Assessment and Plan: 1. CAP (community acquired pneumonia) Influenza PCR negative, blood culture so far negative, urine culture no reports. No sputum culture. Leukocytosis improved, repeat chest x-ray shows possible atypical pneumonia on the left. Pro-calcitonin elevated today.  Continue vancomycin and Fortaz. Follow cultures.. Duo nebs, Mucinex, after device, BiPAP as needed. currently on 4 L,  2. Acute on chronic CHF. Mostly diastolic based on the echocardiogram.  Cardiology recommends Lasix 40 twice a day need to closely monitor renal function since creatinine is creeping up.  Monitor ins and outs and daily weight. Primary management as per cardiology  3. Non-STEMI. Patient underwent cardiac catheterization Shows multivessel disease. Plan is medical management as well as aspirin and Plavix. Currently on heparin. Management as per cardiology.  4. Chronic kidney disease stage III. Patient will be undergoing cardiac catheterization with contrast. monitor urine output as well as renal function.  5 Diabetes mellitus with neuropathy. On insulin Lantus 20 units daily at bedtime, and moderate sliding scale.  Continue gabapentin for neuropathy. Check hemoglobin A1c in the morning.  6 Dyslipidemia. On Lipitor 80.  7 GERD. Continuing PPI.  8 Pressure ulcer. No open ulcers. Continue every 2 hours turns.  DVT Prophylaxis: on therapeutic anticoagulation. Nutrition: Cardiac and diabetic diet Advance goals of care discussion: Full code as per my discussion with  patient's wife as well as patient.  Brief Summary of Hospitalization:  HPI: As per the H and P dictated on admission, "[name] is a 81 y.o. male with a past medical history significant for HFpEF, CAD s/p CABG, HTN, and IDDM who presents with fever, confusion, and cough.  History is collected from the patient and his wife, both of whom are judged to be poor historians. They report that over the last one month the patient has had increasing dyspnea on exertion and breathing difficulty. They are unclear of the reason, but yesterday they went to his PCP who prescribed home oxygen 2 L by nasal cannula. Today the patient developed a fever and seemed more confused than usual to his wife brought him to the ER. Because of respiratory difficulty the patient was administered DuoNeb nebs and Solu-Medrol by EMS on route.  In the ED, the patient had a low-grade fever to 100 F, tachycardia, tachypnea, hypertension, and hypoxia requiring 4 L of supplemental oxygen. His renal function was normal but he had an elevated BUN to creatinine ratio. The lactic acid level was normal. He had an high normal WBC. His troponin was 2.2 ng per mL, and BNP was moderately elevated. Chest x-ray showed pulmonary edema. An ECG showed no ST elevations. In the ER the patient was administered Levaquin, heparin infusion was started, and TRH were asked to admit for presumed CAP and ACS." Daily update: Patient admitted on 11/12/2015, started on antibiotics. Repeat troponin significantly elevated. Cartilage was consulted. Patient was transferred to [facility] on 11/12/2015. Patient underwent cardiac catheterization on 11/13/2015. Consultants: Cardiology Procedures: Cardiac catheterization Echocardiogram Antibiotics: Anti-infectives    Start     Dose/Rate Route Frequency Ordered Stop   11/13/15 2300  levofloxacin (LEVAQUIN) IVPB  750 mg  Status:  Discontinued     750 mg 100 mL/hr over 90 Minutes Intravenous Every 24 hours  11/12/15 0315 11/12/15 0323   11/13/15 2200  levofloxacin (LEVAQUIN) IVPB 750 mg  Status:  Discontinued     750 mg 100 mL/hr over 90 Minutes Intravenous Every 48 hours 11/12/15 0323 11/13/15 0922   11/13/15 2200  vancomycin (VANCOCIN) IVPB 750 mg/150 ml premix     750 mg 150 mL/hr over 60 Minutes Intravenous Every 12 hours 11/13/15 0921     11/13/15 1000  cefTAZidime (FORTAZ) 1 g in dextrose 5 % 50 mL IVPB     1 g 100 mL/hr over 30 Minutes Intravenous Every 12 hours 11/13/15 0931     11/13/15 0930  vancomycin (VANCOCIN) IVPB 1000 mg/200 mL premix     1,000 mg 200 mL/hr over 60 Minutes Intravenous NOW 11/13/15 0921 11/13/15 1115   11/11/15 2315  levofloxacin (LEVAQUIN) tablet 750 mg     750 mg Oral  Once 11/11/15 2312 11/11/15 2340      Family Communication: family was present at bedside, at the time of interview.  Opportunity was given to ask question and all questions were answered satisfactorily.   Disposition:  Barriers to safe discharge: Respiratory failure   Intake/Output Summary (Last 24 hours) at 11/14/15 1332 Last data filed at 11/14/15 1211  Gross per 24 hour  Intake   2273 ml  Output   2090 ml  Net    183 ml   Filed Weights   11/12/15 1935 11/13/15 0406 11/14/15 0355  Weight: 89.6 kg (197 lb 8.5 oz) 89.4 kg (197 lb 1.5 oz) 87.408 kg (192 lb 11.2 oz)    Objective: Physical Exam: Filed Vitals:   11/14/15 1100 11/14/15 1200 11/14/15 1210 11/14/15 1300  BP: 167/66 146/67  151/60  Pulse: 74 71  75  Temp:   101.2 F (38.4 C)   TempSrc:   Oral   Resp: 34 24  38  Height:      Weight:      SpO2: 99% 98%  94%    General: Appear in mild distress, no Rash; Oral Mucosa moist. Cardiovascular: S1 and S2 Present, no Murmur,  positive  JVD Respiratory: Bilateral Air entry present and bilateral extensive Crackles, no wheezes Abdomen: Bowel Sound present , Soft and non- tenderness Extremities: Trace Pedal edema, no calf tenderness  Data Reviewed: CBC:  Recent  Labs Lab 11/11/15 2145 11/12/15 0400 11/13/15 0226 11/14/15 0346  WBC 10.3 7.5 12.6* 9.4  NEUTROABS 6.9  --   --   --   HGB 12.6* 11.9* 11.7* 11.5*  HCT 38.5* 35.5* 35.7* 33.5*  MCV 102.9* 102.3* 101.4* 100.6*  PLT 247 230 229 177   Basic Metabolic Panel:  Recent Labs Lab 11/11/15 2145 11/12/15 0400 11/13/15 0226 11/14/15 0346  NA 142  --  139 140  K 4.3  --  3.8 3.4*  CL 105  --  100* 102  CO2 28  --  29 29  GLUCOSE 253*  --  129* 102*  BUN 35*  --  42* 42*  CREATININE 1.34* 1.39* 1.43* 1.48*  CALCIUM 9.6  --  9.1 8.4*   Liver Function Tests:  Recent Labs Lab 11/11/15 2145  AST 27  ALT 14*  ALKPHOS 42  BILITOT 0.7  PROT 7.1  ALBUMIN 4.0   No results for input(s): LIPASE, AMYLASE in the last 168 hours. No results for input(s): AMMONIA in the last 168 hours.  Cardiac Enzymes:  Recent Labs Lab 11/11/15 2227 11/12/15 0335 11/12/15 0819 11/12/15 1441  TROPONINI 2.20* 18.18* 25.52* 25.58*   BNP (last 3 results)  Recent Labs  11/11/15 2227  BNP 414.8*    ProBNP (last 3 results) No results for input(s): PROBNP in the last 8760 hours.   CBG:  Recent Labs Lab 11/13/15 1517 11/13/15 2001 11/13/15 2352 11/14/15 0351 11/14/15 0821  GLUCAP 127* 141* 161* 97 115*    Recent Results (from the past 240 hour(s))  Culture, blood (routine x 2)     Status: None (Preliminary result)   Collection Time: 11/11/15  9:45 PM  Result Value Ref Range Status   Specimen Description BLOOD RIGHT ANTECUBITAL  Final   Special Requests BOTTLES DRAWN AEROBIC AND ANAEROBIC 8ML  Final   Culture   Final    NO GROWTH 3 DAYS [facility]    Report Status PENDING  Incomplete  Urine culture     Status: None   Collection Time: 11/12/15 12:28 AM  Result Value Ref Range Status   Specimen Description URINE, RANDOM  Final   Special Requests NONE  Final   Culture   Final    NO GROWTH 1 DAY [facility]    Report Status 11/13/2015  FINAL  Final  MRSA PCR Screening     Status: None   Collection Time: 11/12/15  2:56 AM  Result Value Ref Range Status   MRSA by PCR NEGATIVE NEGATIVE Final    Comment:        The GeneXpert MRSA Assay (FDA approved for NASAL specimens only), is one component of a comprehensive MRSA colonization surveillance program. It is not intended to diagnose MRSA infection nor to guide or monitor treatment for MRSA infections.   Culture, sputum-assessment     Status: None   Collection Time: 11/14/15  1:40 AM  Result Value Ref Range Status   Specimen Description EXPECTORATED SPUTUM  Final   Special Requests NONE  Final   Sputum evaluation   Final    MICROSCOPIC FINDINGS SUGGEST THAT THIS SPECIMEN IS NOT REPRESENTATIVE OF LOWER RESPIRATORY SECRETIONS. PLEASE RECOLLECT. Gram Stain Report Called to,Read Back By and Verified With: C BENGE @0234  11/14/15 MKELLY    Report Status 11/14/2015 FINAL  Final     Studies: Dg Chest Port 1 View  11/14/2015  CLINICAL DATA:  CHF, hypertension, diabetes mellitus, coronary artery disease post MI, PTCA and CABG EXAM: PORTABLE CHEST 1 VIEW COMPARISON:  Portable exam 0544 hours compared to 11/13/2015 FINDINGS: Enlargement of cardiac silhouette post CABG. Stable mediastinal contours. Atherosclerotic calcification aorta. Chronic elevation of RIGHT diaphragm with RIGHT basilar atelectasis. Diffuse pulmonary infiltrates bilaterally, asymmetrically greater on LEFT, question asymmetric edema versus infection. No gross pleural effusion or pneumothorax. IMPRESSION: Enlargement of cardiac silhouette post CABG. Persistent asymmetric infiltrates LEFT greater than RIGHT question asymmetric edema versus pneumonia. Electronically Signed   By: Ulyses SouthwardMark  Boles M.D.   On: 11/14/2015 08:08   Dg Abd Portable 1v  11/14/2015  CLINICAL DATA:  Abdominal distension EXAM: PORTABLE ABDOMEN - 1 VIEW COMPARISON:  None. FINDINGS: Scattered large and small bowel gas is noted. No obstructive changes  are seen. Postoperative changes are noted in the lumbar spine and right hip. No abnormal mass or abnormal calcifications are seen. Multilevel degenerative changes in the lumbar spine are noted. No free air is seen. IMPRESSION: No acute abnormality noted. Electronically Signed   By: Alcide CleverMark  Lukens M.D.   On: 11/14/2015 12:05  Scheduled Meds: . amLODipine  2.5 mg Oral Daily  . antiseptic oral rinse  7 mL Mouth Rinse BID  . aspirin EC  81 mg Oral Daily  . atenolol  50 mg Oral Daily  . atorvastatin  80 mg Oral q1800  . cefTAZidime (FORTAZ)  IV  1 g Intravenous Q12H  . [START ON 11/15/2015] clopidogrel  75 mg Oral Daily  . enoxaparin (LOVENOX) injection  30 mg Subcutaneous Q24H  . furosemide  40 mg Intravenous Q12H  . gabapentin  300 mg Oral BID  . guaiFENesin  600 mg Oral BID  . insulin aspart  0-15 Units Subcutaneous 6 times per day  . insulin glargine  20 Units Subcutaneous QHS  . isosorbide mononitrate  30 mg Oral Daily  . lisinopril  20 mg Oral Daily  . pantoprazole  40 mg Oral Daily  . potassium chloride  20 mEq Oral BID  . ranolazine  1,000 mg Oral BID  . sodium chloride  3 mL Intravenous Q12H  . sodium chloride  3 mL Intravenous Q12H  . vancomycin  750 mg Intravenous Q12H   Continuous Infusions: . sodium chloride 10 mL/hr at 11/14/15 0645   PRN Meds: sodium chloride, acetaminophen, bisacodyl, ipratropium-albuterol, nitroGLYCERIN, ondansetron (ZOFRAN) IV, sodium chloride  Time spent: 30 minutes  Author: Lynden Oxford, MD [facility] Pager: [phone] 11/14/2015 1:32 PM  If 7PM-7AM, please contact night-coverage at [email or web address], password [email or web address]

## 2015-11-14 NOTE — Progress Notes (Signed)
Patient Name: [name] Date of Encounter: 11/14/2015     Principal Problem:   CAP (community acquired pneumonia) Active Problems:   Essential hypertension   Diabetes mellitus with neurological manifestation (HCC)   CHF (congestive heart failure) (HCC)   Elevated troponin   CKD (chronic kidney disease), stage III   Altered mental state   NSTEMI (non-ST elevated myocardial infarction) (HCC)   Acute congestive heart failure (HCC)   Pressure ulcer   Acute on chronic diastolic CHF (congestive heart failure) (HCC)   Acute respiratory failure with hypoxia (HCC)    SUBJECTIVE  The patient remains dyspneic overnight.  Chest x-ray shows some improvement in the right lung but left lung looks worse.  He tolerated cardiac catheterization yesterday.  No intervention.  Continue medical therapy. Mildly confused overnight.  CURRENT MEDS . amLODipine  2.5 mg Oral Daily  . antiseptic oral rinse  7 mL Mouth Rinse BID  . aspirin EC  81 mg Oral Daily  . atenolol  50 mg Oral Daily  . atorvastatin  80 mg Oral q1800  . cefTAZidime (FORTAZ)  IV  1 g Intravenous Q12H  . clopidogrel  300 mg Oral Once  . [START ON 11/15/2015] clopidogrel  75 mg Oral Daily  . enoxaparin (LOVENOX) injection  30 mg Subcutaneous Q24H  . furosemide  40 mg Intravenous Q12H  . gabapentin  300 mg Oral BID  . guaiFENesin  600 mg Oral BID  . insulin aspart  0-15 Units Subcutaneous 6 times per day  . insulin glargine  20 Units Subcutaneous QHS  . isosorbide mononitrate  30 mg Oral Daily  . lisinopril  20 mg Oral Daily  . pantoprazole  40 mg Oral Daily  . potassium chloride  20 mEq Oral BID  . ranolazine  1,000 mg Oral BID  . sodium chloride  3 mL Intravenous Q12H  . sodium chloride  3 mL Intravenous Q12H  . vancomycin  750 mg Intravenous Q12H    OBJECTIVE  Filed Vitals:   11/14/15 0500 11/14/15 0504 11/14/15 0600 11/14/15 0700  BP:  155/56 159/60 163/65  Pulse: 76 77 77 75  Temp:      TempSrc:        Resp: 29 33 22 26  Height:      Weight:      SpO2: 94% 93% 98% 97%    Intake/Output Summary (Last 24 hours) at 11/14/15 0814 Last data filed at 11/14/15 0700  Gross per 24 hour  Intake   1688 ml  Output   2315 ml  Net   -627 ml   Filed Weights   11/12/15 1935 11/13/15 0406 11/14/15 0355  Weight: 197 lb 8.5 oz (89.6 kg) 197 lb 1.5 oz (89.4 kg) 192 lb 11.2 oz (87.408 kg)    PHYSICAL EXAM  General: Pleasant, NAD. mildly confused  Neuro: Alert and oriented X 3. Moves all extremities spontaneously. Psych: Normal affect. HEENT:  Normal  Neck: Supple without bruits or JVD. Lungs:  Resp regular and unlaborebilateral rales and rhonchi worse on the left  Heart: RRR no s3, s4, or murmurs. Abdomen: Soft, non-tender, non-distended, BS + x 4.  right groin okay following catheter  Extremities: No clubbing, cyanosis or edema. DP/PT/Radials 2+ and equal bilaterally.  Accessory Clinical Findings  CBC  Recent Labs  11/11/15 2145  11/13/15 0226 11/14/15 0346  WBC 10.3  < > 12.6* 9.4  NEUTROABS 6.9  --   --   --   HGB 12.6*  < >  11.7* 11.5*  HCT 38.5*  < > 35.7* 33.5*  MCV 102.9*  < > 101.4* 100.6*  PLT 247  < > 229 177  < > = values in this interval not displayed. Basic Metabolic Panel  Recent Labs  11/13/15 0226 11/14/15 0346  NA 139 140  K 3.8 3.4*  CL 100* 102  CO2 29 29  GLUCOSE 129* 102*  BUN 42* 42*  CREATININE 1.43* 1.48*  CALCIUM 9.1 8.4*   Liver Function Tests  Recent Labs  11/11/15 2145  AST 27  ALT 14*  ALKPHOS 42  BILITOT 0.7  PROT 7.1  ALBUMIN 4.0   No results for input(s): LIPASE, AMYLASE in the last 72 hours. Cardiac Enzymes  Recent Labs  11/12/15 0335 11/12/15 0819 11/12/15 1441  TROPONINI 18.18* 25.52* 25.58*   BNP Invalid input(s): POCBNP D-Dimer No results for input(s): DDIMER in the last 72 hours. Hemoglobin A1C No results for input(s): HGBA1C in the last 72 hours. Fasting Lipid Panel No results for input(s): CHOL, HDL,  LDLCALC, TRIG, CHOLHDL, LDLDIRECT in the last 72 hours. Thyroid Function Tests No results for input(s): TSH, T4TOTAL, T3FREE, THYROIDAB in the last 72 hours.  Invalid input(s): FREET3  TELE  normal sinus rhythm with PACs     Radiology/Studies  Dg Clavicle Left  11/07/2015  CLINICAL DATA:  Recent fall onto left shoulder with clavicular pain, initial encounter EXAM: LEFT CLAVICLE - 2+ VIEWS COMPARISON:  None. FINDINGS: There is no evidence of fracture or other focal bone lesions. Soft tissues are unremarkable. IMPRESSION: No acute abnormality noted. Electronically Signed   By: Alcide Clever M.D.   On: 11/07/2015 19:25   Ct Head Wo Contrast  11/13/2015  CLINICAL DATA:  Altered mental status.  Impaired orientation EXAM: CT HEAD WITHOUT CONTRAST TECHNIQUE: Contiguous axial images were obtained from the base of the skull through the vertex without intravenous contrast. COMPARISON:  08/13/2006 FINDINGS: Skull and Sinuses:Negative for fracture or destructive process. Mucosal thickening focally in the right maxillary antrum, without fluid level and presumed incidental given the history. Visualized orbits: Bilateral cataract resection Brain: No evidence of acute infarction, hemorrhage, hydrocephalus, or mass lesion/mass effect. Generalized cerebral volume loss which is normal for age. Normal cerebral white matter for age. IMPRESSION: 1. Normal intracranial imaging for age. 2. Chronic right maxillary sinusitis. Electronically Signed   By: Marnee Spring M.D.   On: 11/13/2015 04:53   Dg Chest Port 1 View  11/14/2015  CLINICAL DATA:  CHF, hypertension, diabetes mellitus, coronary artery disease post MI, PTCA and CABG EXAM: PORTABLE CHEST 1 VIEW COMPARISON:  Portable exam 0544 hours compared to 11/13/2015 FINDINGS: Enlargement of cardiac silhouette post CABG. Stable mediastinal contours. Atherosclerotic calcification aorta. Chronic elevation of RIGHT diaphragm with RIGHT basilar atelectasis. Diffuse  pulmonary infiltrates bilaterally, asymmetrically greater on LEFT, question asymmetric edema versus infection. No gross pleural effusion or pneumothorax. IMPRESSION: Enlargement of cardiac silhouette post CABG. Persistent asymmetric infiltrates LEFT greater than RIGHT question asymmetric edema versus pneumonia. Electronically Signed   By: Ulyses Southward M.D.   On: 11/14/2015 08:08   Dg Chest Port 1 View  11/13/2015  CLINICAL DATA:  Chest pain EXAM: PORTABLE CHEST - 1 VIEW COMPARISON:  11/11/2015 FINDINGS: Cardiac shadow is mildly enlarged but stable. Postoperative changes are again seen. Increasing bilateral infiltrates are noted consistent with progressive pulmonary edema. Elevation of the right hemidiaphragm is again seen. IMPRESSION: Increasing pulmonary edema bilaterally. Electronically Signed   By: Alcide Clever M.D.   On: 11/13/2015 08:29  Dg Chest Port 1 View  11/11/2015  CLINICAL DATA:  Fever and wheezing EXAM: PORTABLE CHEST - 1 VIEW COMPARISON:  10/28/2011 FINDINGS: Cardiac shadow is again enlarged. Postsurgical changes are again seen. Elevation the right hemidiaphragm is noted. Vascular congestion with patchy edematous changes are seen this likely represents CHF although the possibility of superimposed infiltrate deserves consideration as well. IMPRESSION: Increased vascular congestion and patchy changes likely related to CHF. Electronically Signed   By: Alcide CleverMark  Lukens M.D.   On: 11/11/2015 21:50   Dg Shoulder Left  11/07/2015  CLINICAL DATA:  81 year old male with acute left shoulder pain following fall today. EXAM: LEFT SHOULDER - 2+ VIEW COMPARISON:  None. FINDINGS: There is no evidence of acute fracture, subluxation or dislocation. Degenerative changes at the glenohumeral joint noted. Visualized bony left hemi thorax is unremarkable. IMPRESSION: No acute bony abnormality. Electronically Signed   By: Harmon PierJeffrey  Hu M.D.   On: 11/07/2015 19:43    ASSESSMENT AND PLAN Non-STEMI. Status post remote  CABG. Cardiac cath 11/13/15 -->medical therapy. DAPT.  CAP (community acquired pneumonia)  Active Problems:  Essential hypertension  Diabetes mellitus with neurological manifestation (HCC)  Acute diastolic heart failure with ejection fraction 50-55% by echo  Elevated troponin  CKD (chronic kidney disease), stage III secondary to DMII Altered mental state  Plan: Add plavix. Replete K Increase IV lasix to 40 mg BID  Signed, Ronny FlurryBrackbill, Tom MD

## 2015-11-15 ENCOUNTER — Inpatient Hospital Stay (HOSPITAL_COMMUNITY): Payer: Medicare Other

## 2015-11-15 DIAGNOSIS — N183 Chronic kidney disease, stage 3 (moderate): Secondary | ICD-10-CM

## 2015-11-15 DIAGNOSIS — J9601 Acute respiratory failure with hypoxia: Secondary | ICD-10-CM

## 2015-11-15 DIAGNOSIS — I5033 Acute on chronic diastolic (congestive) heart failure: Secondary | ICD-10-CM

## 2015-11-15 LAB — BASIC METABOLIC PANEL
Anion gap: 9 (ref 5–15)
BUN: 42 mg/dL — AB (ref 6–20)
CALCIUM: 8.1 mg/dL — AB (ref 8.9–10.3)
CHLORIDE: 100 mmol/L — AB (ref 101–111)
CO2: 30 mmol/L (ref 22–32)
CREATININE: 1.61 mg/dL — AB (ref 0.61–1.24)
GFR calc non Af Amer: 37 mL/min — ABNORMAL LOW (ref >60)
GFR, EST AFRICAN AMERICAN: 43 mL/min — AB (ref >60)
GLUCOSE: 142 mg/dL — AB (ref 65–99)
Potassium: 3.5 mmol/L (ref 3.5–5.1)
Sodium: 139 mmol/L (ref 135–145)

## 2015-11-15 LAB — CBC WITH DIFFERENTIAL/PLATELET
BASOS PCT: 0 %
Basophils Absolute: 0 10*3/uL (ref 0.0–0.1)
EOS ABS: 0 10*3/uL (ref 0.0–0.7)
EOS PCT: 0 %
HCT: 35.5 % — ABNORMAL LOW (ref 39.0–52.0)
HEMOGLOBIN: 12.1 g/dL — AB (ref 13.0–17.0)
LYMPHS ABS: 0.6 10*3/uL — AB (ref 0.7–4.0)
Lymphocytes Relative: 8 %
MCH: 34 pg (ref 26.0–34.0)
MCHC: 34.1 g/dL (ref 30.0–36.0)
MCV: 99.7 fL (ref 78.0–100.0)
MONO ABS: 0.6 10*3/uL (ref 0.1–1.0)
MONOS PCT: 8 %
NEUTROS PCT: 84 %
Neutro Abs: 6.5 10*3/uL (ref 1.7–7.7)
Platelets: 167 10*3/uL (ref 150–400)
RBC: 3.56 MIL/uL — ABNORMAL LOW (ref 4.22–5.81)
RDW: 14 % (ref 11.5–15.5)
WBC: 7.8 10*3/uL (ref 4.0–10.5)

## 2015-11-15 LAB — GLUCOSE, CAPILLARY
GLUCOSE-CAPILLARY: 124 mg/dL — AB (ref 65–99)
GLUCOSE-CAPILLARY: 174 mg/dL — AB (ref 65–99)
GLUCOSE-CAPILLARY: 175 mg/dL — AB (ref 65–99)
Glucose-Capillary: 208 mg/dL — ABNORMAL HIGH (ref 65–99)
Glucose-Capillary: 131 mg/dL — ABNORMAL HIGH (ref 65–99)
Glucose-Capillary: 166 mg/dL — ABNORMAL HIGH (ref 65–99)

## 2015-11-15 LAB — POCT I-STAT 3, ART BLOOD GAS (G3+)
Acid-Base Excess: 4 mmol/L — ABNORMAL HIGH (ref 0.0–2.0)
Bicarbonate: 27.8 mEq/L — ABNORMAL HIGH (ref 20.0–24.0)
O2 SAT: 92 %
PCO2 ART: 37 mmHg (ref 35.0–45.0)
PH ART: 7.485 — AB (ref 7.350–7.450)
TCO2: 29 mmol/L (ref 0–100)
pO2, Arterial: 60 mmHg — ABNORMAL LOW (ref 80.0–100.0)

## 2015-11-15 LAB — MAGNESIUM: MAGNESIUM: 1.8 mg/dL (ref 1.7–2.4)

## 2015-11-15 MED ORDER — SODIUM CHLORIDE 0.9 % IV SOLN
25.0000 ug/h | INTRAVENOUS | Status: DC
  Administered 2015-11-15: 25 ug/h via INTRAVENOUS
  Filled 2015-11-15: qty 50

## 2015-11-15 MED ORDER — FENTANYL BOLUS VIA INFUSION
50.0000 ug | INTRAVENOUS | Status: DC | PRN
  Administered 2015-11-15 – 2015-11-16 (×2): 25 ug via INTRAVENOUS
  Filled 2015-11-15: qty 50

## 2015-11-15 MED ORDER — DOPAMINE-DEXTROSE 3.2-5 MG/ML-% IV SOLN
INTRAVENOUS | Status: AC
  Filled 2015-11-15: qty 250

## 2015-11-15 MED ORDER — ETOMIDATE 2 MG/ML IV SOLN
20.0000 mg | Freq: Once | INTRAVENOUS | Status: AC
  Administered 2015-11-15: 20 mg via INTRAVENOUS

## 2015-11-15 MED ORDER — DOPAMINE-DEXTROSE 3.2-5 MG/ML-% IV SOLN
0.0000 ug/kg/min | INTRAVENOUS | Status: DC
  Administered 2015-11-15: 5 ug/kg/min via INTRAVENOUS
  Administered 2015-11-16 (×2): 8 ug/kg/min via INTRAVENOUS
  Filled 2015-11-15 (×2): qty 250

## 2015-11-15 MED ORDER — PANTOPRAZOLE SODIUM 40 MG PO PACK
40.0000 mg | PACK | Freq: Every day | ORAL | Status: DC
  Administered 2015-11-16 – 2015-11-17 (×2): 40 mg
  Filled 2015-11-15 (×2): qty 20

## 2015-11-15 MED ORDER — POTASSIUM CHLORIDE 20 MEQ/15ML (10%) PO SOLN
20.0000 meq | Freq: Two times a day (BID) | ORAL | Status: DC
  Administered 2015-11-15 – 2015-11-19 (×8): 20 meq
  Filled 2015-11-15 (×9): qty 15

## 2015-11-15 MED ORDER — CHLORHEXIDINE GLUCONATE 0.12% ORAL RINSE (MEDLINE KIT)
15.0000 mL | Freq: Two times a day (BID) | OROMUCOSAL | Status: DC
  Administered 2015-11-15 – 2015-11-17 (×4): 15 mL via OROMUCOSAL

## 2015-11-15 MED ORDER — POTASSIUM CHLORIDE 20 MEQ/15ML (10%) PO SOLN: 20.0000 meq | Freq: Two times a day (BID) | ORAL | Status: DC

## 2015-11-15 MED ORDER — SODIUM CHLORIDE 0.9 % IV BOLUS (SEPSIS)
1000.0000 mL | Freq: Once | INTRAVENOUS | Status: AC
  Administered 2015-11-15: 1000 mL via INTRAVENOUS

## 2015-11-15 MED ORDER — INSULIN ASPART 100 UNIT/ML ~~LOC~~ SOLN
0.0000 [IU] | SUBCUTANEOUS | Status: DC
  Administered 2015-11-15: 2 [IU] via SUBCUTANEOUS
  Administered 2015-11-15 – 2015-11-16 (×2): 1 [IU] via SUBCUTANEOUS
  Administered 2015-11-16: 2 [IU] via SUBCUTANEOUS
  Administered 2015-11-16 (×3): 1 [IU] via SUBCUTANEOUS
  Administered 2015-11-17: 3 [IU] via SUBCUTANEOUS
  Administered 2015-11-18: 2 [IU] via SUBCUTANEOUS
  Administered 2015-11-18 (×2): 3 [IU] via SUBCUTANEOUS
  Administered 2015-11-18: 1 [IU] via SUBCUTANEOUS
  Administered 2015-11-18: 3 [IU] via SUBCUTANEOUS
  Administered 2015-11-19: 1 [IU] via SUBCUTANEOUS
  Administered 2015-11-19 (×2): 3 [IU] via SUBCUTANEOUS

## 2015-11-15 MED ORDER — MIDAZOLAM HCL 2 MG/2ML IJ SOLN
INTRAMUSCULAR | Status: AC
  Administered 2015-11-15: 2 mg
  Filled 2015-11-15: qty 2

## 2015-11-15 MED ORDER — MIDAZOLAM HCL 2 MG/2ML IJ SOLN
1.0000 mg | INTRAMUSCULAR | Status: DC | PRN
  Administered 2015-11-15: 1 mg via INTRAVENOUS
  Filled 2015-11-15: qty 2

## 2015-11-15 MED ORDER — ATENOLOL 25 MG PO TABS
50.0000 mg | ORAL_TABLET | Freq: Every day | ORAL | Status: DC
Start: 2015-11-16 — End: 2015-11-19
  Administered 2015-11-16 – 2015-11-19 (×3): 50 mg via NASOGASTRIC
  Filled 2015-11-15: qty 1
  Filled 2015-11-15: qty 2
  Filled 2015-11-15 (×2): qty 1

## 2015-11-15 MED ORDER — FUROSEMIDE 10 MG/ML IJ SOLN
80.0000 mg | Freq: Once | INTRAMUSCULAR | Status: AC
  Administered 2015-11-15: 80 mg via INTRAVENOUS

## 2015-11-15 MED ORDER — AMLODIPINE BESYLATE 5 MG PO TABS
2.5000 mg | ORAL_TABLET | Freq: Every day | ORAL | Status: DC
  Administered 2015-11-18 – 2015-11-19 (×2): 2.5 mg via NASOGASTRIC
  Filled 2015-11-15 (×2): qty 1

## 2015-11-15 MED ORDER — MIDAZOLAM HCL 2 MG/2ML IJ SOLN
1.0000 mg | INTRAMUSCULAR | Status: DC | PRN
  Administered 2015-11-15 (×2): 1 mg via INTRAVENOUS
  Filled 2015-11-15: qty 2

## 2015-11-15 MED ORDER — INSULIN ASPART 100 UNIT/ML ~~LOC~~ SOLN: 0.0000 [IU] | Freq: Every day | SUBCUTANEOUS | Status: DC

## 2015-11-15 MED ORDER — FENTANYL CITRATE (PF) 100 MCG/2ML IJ SOLN: 50.0000 ug | Freq: Once | INTRAMUSCULAR | Status: AC

## 2015-11-15 MED ORDER — ROCURONIUM BROMIDE 50 MG/5ML IV SOLN
50.0000 mg | Freq: Once | INTRAVENOUS | Status: AC
  Administered 2015-11-15: 50 mg via INTRAVENOUS

## 2015-11-15 MED ORDER — FENTANYL CITRATE (PF) 100 MCG/2ML IJ SOLN
INTRAMUSCULAR | Status: AC
  Administered 2015-11-15: 100 ug
  Filled 2015-11-15: qty 2

## 2015-11-15 MED ORDER — ANTISEPTIC ORAL RINSE SOLUTION (CORINZ)
7.0000 mL | Freq: Four times a day (QID) | OROMUCOSAL | Status: DC
  Administered 2015-11-15 – 2015-11-17 (×6): 7 mL via OROMUCOSAL

## 2015-11-15 MED ORDER — LISINOPRIL 20 MG PO TABS
20.0000 mg | ORAL_TABLET | Freq: Every day | ORAL | Status: DC
  Administered 2015-11-16: 20 mg via NASOGASTRIC
  Filled 2015-11-15: qty 1

## 2015-11-15 MED ORDER — INSULIN ASPART 100 UNIT/ML ~~LOC~~ SOLN
0.0000 [IU] | Freq: Three times a day (TID) | SUBCUTANEOUS | Status: DC
  Administered 2015-11-15: 1 [IU] via SUBCUTANEOUS
  Administered 2015-11-15: 2 [IU] via SUBCUTANEOUS

## 2015-11-15 NOTE — Progress Notes (Signed)
[facility] Progress Note    Patient: [name]    WUJ:[id number]  DOB: 1928-09-23     DOA: 11/11/2015 Date of Service: the patient was seen and examined on 11/15/2015  Subjective: Patient appear to be in respiratory distress this morning. Denies having any chest pain or abdominal pain. No nausea. Has not received a flutter device ordered 48 hours ago. Last ambulation received on 23rd at 1 AM. Nutrition: Able to tolerate oral diet Activity: Mostly bedbound Last BM: Prior to admission  Assessment and Plan: 1. CAP (community acquired pneumonia) Influenza PCR negative, blood culture so far negative, urine culture no reports. No sputum culture. Leukocytosis improved, repeat chest x-ray shows possible atypical pneumonia on the left. Pro-calcitonin elevated.  Continue vancomycin and Fortaz. Follow cultures.. Duo nebs, Mucinex, after device. BiPAP has not been used since 11/13/2015. Will change the order to continuous. currently on 4 L, appearing to be developing ARDS like picture. Critical care consulted. Check ABG. Critical care will continue to remain primary medical team for this patient. [facility] will sign off and will follow peripherally.  2. Acute on chronic CHF. Mostly diastolic based on the echocardiogram.  Cardiology recommends Lasix 40 twice a day need to closely monitor renal function since creatinine is creeping up.  Monitor ins and outs and daily weight. Primary management as per cardiology  3. Non-STEMI. Patient underwent cardiac catheterization Shows multivessel disease. Plan is medical management as well as aspirin and Plavix. Currently on heparin. Management as per cardiology.  4. Chronic kidney disease stage III. Patient will be undergoing cardiac catheterization with contrast. monitor urine output as well as renal function.  5 Diabetes mellitus with neuropathy. On insulin Lantus 20 units daily at bedtime, and moderate sliding scale.  Continue  gabapentin for neuropathy.  6 Dyslipidemia. On Lipitor 80.  7 GERD. Continuing PPI.  8 Pressure ulcer. No open ulcers. Continue every 2 hours turns.  DVT Prophylaxis: on therapeutic anticoagulation. Nutrition: Cardiac and diabetic diet Advance goals of care discussion: Full code as per my discussion with patient's wife as well as patient.  Brief Summary of Hospitalization:  HPI: As per the H and P dictated on admission, "[name] is a 81 y.o. male with a past medical history significant for HFpEF, CAD s/p CABG, HTN, and IDDM who presents with fever, confusion, and cough.  History is collected from the patient and his wife, both of whom are judged to be poor historians. They report that over the last one month the patient has had increasing dyspnea on exertion and breathing difficulty. They are unclear of the reason, but yesterday they went to his PCP who prescribed home oxygen 2 L by nasal cannula. Today the patient developed a fever and seemed more confused than usual to his wife brought him to the ER. Because of respiratory difficulty the patient was administered DuoNeb nebs and Solu-Medrol by EMS on route.  In the ED, the patient had a low-grade fever to 100 F, tachycardia, tachypnea, hypertension, and hypoxia requiring 4 L of supplemental oxygen. His renal function was normal but he had an elevated BUN to creatinine ratio. The lactic acid level was normal. He had an high normal WBC. His troponin was 2.2 ng per mL, and BNP was moderately elevated. Chest x-ray showed pulmonary edema. An ECG showed no ST elevations. In the ER the patient was administered Levaquin, heparin infusion was started, and [facility] were asked to admit for presumed CAP and ACS." Daily update: Patient admitted on 11/12/2015,  started on antibiotics. Repeat troponin significantly elevated. Cartilage was consulted. Patient was transferred to [facility] on 11/12/2015. Patient underwent cardiac catheterization  on 11/13/2015. 11/15/2015 critical care consulted Consultants: Cardiology Procedures: Cardiac catheterization Echocardiogram Antibiotics: Anti-infectives    Start     Dose/Rate Route Frequency Ordered Stop   11/13/15 2300  levofloxacin (LEVAQUIN) IVPB 750 mg  Status:  Discontinued     750 mg 100 mL/hr over 90 Minutes Intravenous Every 24 hours 11/12/15 0315 11/12/15 0323   11/13/15 2200  levofloxacin (LEVAQUIN) IVPB 750 mg  Status:  Discontinued     750 mg 100 mL/hr over 90 Minutes Intravenous Every 48 hours 11/12/15 0323 11/13/15 0922   11/13/15 2200  vancomycin (VANCOCIN) IVPB 750 mg/150 ml premix     750 mg 150 mL/hr over 60 Minutes Intravenous Every 12 hours 11/13/15 0921     11/13/15 1000  cefTAZidime (FORTAZ) 1 g in dextrose 5 % 50 mL IVPB     1 g 100 mL/hr over 30 Minutes Intravenous Every 12 hours 11/13/15 0931     11/13/15 0930  vancomycin (VANCOCIN) IVPB 1000 mg/200 mL premix     1,000 mg 200 mL/hr over 60 Minutes Intravenous NOW 11/13/15 0921 11/13/15 1115   11/11/15 2315  levofloxacin (LEVAQUIN) tablet 750 mg     750 mg Oral  Once 11/11/15 2312 11/11/15 2340      Family Communication: family was present at bedside, at the time of interview.  Opportunity was given to ask question and all questions were answered satisfactorily.   Disposition:  Barriers to safe discharge: Respiratory failure   Intake/Output Summary (Last 24 hours) at 11/15/15 1310 Last data filed at 11/15/15 1200  Gross per 24 hour  Intake   1616 ml  Output   1900 ml  Net   -284 ml   Filed Weights   11/13/15 0406 11/14/15 0355 11/15/15 0300  Weight: 89.4 kg (197 lb 1.5 oz) 87.408 kg (192 lb 11.2 oz) 87.544 kg (193 lb)    Objective: Physical Exam: Filed Vitals:   11/15/15 1000 11/15/15 1100 11/15/15 1200 11/15/15 1256  BP: 161/85 140/51 132/48 129/51  Pulse: 79 66 65 66  Temp:   98.6 F (37 C)   TempSrc:   Oral   Resp: 30 24 32 32  Height:      Weight:      SpO2: 99% 96% 94% 94%      General: Appear in mild distress, no Rash; Oral Mucosa moist. Cardiovascular: S1 and S2 Present, no Murmur,  positive  JVD Respiratory: Bilateral Air entry present and bilateral extensive Crackles, occasional wheezes Abdomen: Bowel Sound present , Soft and non- tenderness Extremities: Trace Pedal edema, no calf tenderness  Data Reviewed: CBC:  Recent Labs Lab 11/11/15 2145 11/12/15 0400 11/13/15 0226 11/14/15 0346 11/15/15 0511  WBC 10.3 7.5 12.6* 9.4 7.8  NEUTROABS 6.9  --   --   --  6.5  HGB 12.6* 11.9* 11.7* 11.5* 12.1*  HCT 38.5* 35.5* 35.7* 33.5* 35.5*  MCV 102.9* 102.3* 101.4* 100.6* 99.7  PLT 247 230 229 177 167   Basic Metabolic Panel:  Recent Labs Lab 11/11/15 2145 11/12/15 0400 11/13/15 0226 11/14/15 0346 11/15/15 0511  NA 142  --  139 140 139  K 4.3  --  3.8 3.4* 3.5  CL 105  --  100* 102 100*  CO2 28  --  GLUCOSE 253*  --  129* 102* 142*  BUN 35*  --  42* 42* 42*  CREATININE 1.34* 1.39* 1.43* 1.48* 1.61*  CALCIUM 9.6  --  9.1 8.4* 8.1*  MG  --   --   --   --  1.8   Liver Function Tests:  Recent Labs Lab 11/11/15 2145  AST 27  ALT 14*  ALKPHOS 42  BILITOT 0.7  PROT 7.1  ALBUMIN 4.0   No results for input(s): LIPASE, AMYLASE in the last 168 hours. No results for input(s): AMMONIA in the last 168 hours.  Cardiac Enzymes:  Recent Labs Lab 11/11/15 2227 11/12/15 0335 11/12/15 0819 11/12/15 1441  TROPONINI 2.20* 18.18* 25.52* 25.58*   BNP (last 3 results)  Recent Labs  11/11/15 2227  BNP 414.8*    ProBNP (last 3 results) No results for input(s): PROBNP in the last 8760 hours.   CBG:  Recent Labs Lab 11/14/15 2001 11/14/15 2339 11/15/15 0354 11/15/15 0811 11/15/15 1133  GLUCAP 208* 175* 124* 131* 174*    Recent Results (from the past 240 hour(s))  Culture, blood (routine x 2)     Status: None (Preliminary result)   Collection Time: 11/11/15  9:45 PM  Result Value Ref Range Status   Specimen Description  BLOOD RIGHT ANTECUBITAL  Final   Special Requests BOTTLES DRAWN AEROBIC AND ANAEROBIC  Final   Culture   Final    NO GROWTH 4 DAYS Performed at [facility]    Report Status PENDING  Incomplete  Urine culture     Status: None   Collection Time: 11/12/15 12:28 AM  Result Value Ref Range Status   Specimen Description URINE, RANDOM  Final   Special Requests NONE  Final   Culture   Final    NO GROWTH 1 DAY Performed at [facility]    Report Status 11/13/2015 FINAL  Final  MRSA PCR Screening     Status: None   Collection Time: 11/12/15  2:56 AM  Result Value Ref Range Status   MRSA by PCR NEGATIVE NEGATIVE Final    Comment:        The GeneXpert MRSA Assay (FDA approved for NASAL specimens only), is one component of a comprehensive MRSA colonization surveillance program. It is not intended to diagnose MRSA infection nor to guide or monitor treatment for MRSA infections.   Culture, sputum-assessment     Status: None   Collection Time: 11/14/15  1:40 AM  Result Value Ref Range Status   Specimen Description EXPECTORATED SPUTUM  Final   Special Requests NONE  Final   Sputum evaluation   Final    MICROSCOPIC FINDINGS SUGGEST THAT THIS SPECIMEN IS NOT REPRESENTATIVE OF LOWER RESPIRATORY SECRETIONS. PLEASE RECOLLECT. Gram Stain Report Called to,Read Back By and Verified With: C BENGE  11/14/15 MKELLY    Report Status 11/14/2015 FINAL  Final     Studies: Dg Chest Port 1 View  11/15/2015  CLINICAL DATA:  81 year old male with congestive heart failure EXAM: PORTABLE CHEST 1 VIEW COMPARISON:  Prior chest x-ray 11/14/2015 FINDINGS: Stable cardiac and mediastinal contours. Patient is status post median sternotomy with evidence of prior multivessel CABG. Similar to perhaps incrementally improved interstitial pulmonary edema with slight clearing in the right mid lung. There is persistent asymmetrically dense opacification of the left mid lung. Probable small  bilateral pleural effusions. No pneumothorax. No acute osseous abnormality. IMPRESSION: 1. Perhaps incrementally improved interstitial pulmonary edema. 2. Persistent asymmetric patchy airspace opacity in the left mid lung concerning for either superimposed pneumonia, or (less likely) asymmetric pulmonary  edema. Electronically Signed   By: Malachy MoanHeath  McCullough M.D.   On: 11/15/2015 09:09     Scheduled Meds: . amLODipine  2.5 mg Oral Daily  . antiseptic oral rinse  7 mL Mouth Rinse BID  . aspirin EC  81 mg Oral Daily  . atenolol  50 mg Oral Daily  . atorvastatin  80 mg Oral q1800  . cefTAZidime (FORTAZ)  IV  1 g Intravenous Q12H  . clopidogrel  75 mg Oral Daily  . enoxaparin (LOVENOX) injection  30 mg Subcutaneous Q24H  . fentaNYL      . furosemide  40 mg Intravenous Q12H  . gabapentin  300 mg Oral BID  . guaiFENesin  600 mg Oral BID  . insulin aspart  0-9 Units Subcutaneous 6 times per day  . insulin glargine  20 Units Subcutaneous QHS  . isosorbide mononitrate  30 mg Oral Daily  . lisinopril  20 mg Oral Daily  . midazolam      . pantoprazole  40 mg Oral Daily  . potassium chloride  20 mEq Oral BID  . ranolazine  1,000 mg Oral BID  . sodium chloride  3 mL Intravenous Q12H  . sodium chloride  3 mL Intravenous Q12H  . vancomycin  750 mg Intravenous Q12H   Continuous Infusions: . sodium chloride 10 mL/hr at 11/15/15 0000   PRN Meds: sodium chloride, acetaminophen, bisacodyl, ipratropium-albuterol, nitroGLYCERIN, ondansetron (ZOFRAN) IV, sodium chloride  Time spent: 30 minutes  Author: Lynden OxfordPranav Calen Posch, MD [facility] Pager: [phone] 11/15/2015 1:10 PM  If 7PM-7AM, please contact night-coverage at [email or web address], password [email or web address]

## 2015-11-15 NOTE — Progress Notes (Signed)
Patient Name: [name] Date of Encounter: 11/15/2015     Principal Problem:   CAP (community acquired pneumonia) Active Problems:   Essential hypertension   Diabetes mellitus with neurological manifestation (HCC)   CHF (congestive heart failure) (HCC)   Elevated troponin   CKD (chronic kidney disease), stage III   Altered mental state   NSTEMI (non-ST elevated myocardial infarction) (HCC)   Acute congestive heart failure (HCC)   Pressure ulcer   Acute on chronic diastolic CHF (congestive heart failure) (HCC)   Acute respiratory failure with hypoxia (HCC)    SUBJECTIVE  Underwent cath 12/22 with severe CAD. Only LIMA patent. SVGs occluded. Recommended medical therapy. IV lasix increased yesterday. Weight unchanged. Creatinine bumping.   This morning in respiratory distress. Breathing 30-35x/minute. Feels cold. Hacking cough. No CP. BiPaP ordered.   CXR with improved edema but persistent L PNA.   CURRENT MEDS . amLODipine  2.5 mg Oral Daily  . antiseptic oral rinse  7 mL Mouth Rinse BID  . aspirin EC  81 mg Oral Daily  . atenolol  50 mg Oral Daily  . atorvastatin  80 mg Oral q1800  . cefTAZidime (FORTAZ)  IV  1 g Intravenous Q12H  . clopidogrel  75 mg Oral Daily  . enoxaparin (LOVENOX) injection  30 mg Subcutaneous Q24H  . furosemide  40 mg Intravenous Q12H  . gabapentin  300 mg Oral BID  . guaiFENesin  600 mg Oral BID  . insulin aspart  0-5 Units Subcutaneous QHS  . insulin aspart  0-9 Units Subcutaneous TID WC  . insulin glargine  20 Units Subcutaneous QHS  . isosorbide mononitrate  30 mg Oral Daily  . lisinopril  20 mg Oral Daily  . pantoprazole  40 mg Oral Daily  . potassium chloride  20 mEq Oral BID  . ranolazine  1,000 mg Oral BID  . sodium chloride  3 mL Intravenous Q12H  . sodium chloride  3 mL Intravenous Q12H  . vancomycin  750 mg Intravenous Q12H    OBJECTIVE  Filed Vitals:   11/15/15 0500 11/15/15 0600 11/15/15 0700 11/15/15 0800  BP:  145/62 163/61 172/61 171/59  Pulse: 66 69 71 72  Temp:    98.6 F (37 C)  TempSrc:    Oral  Resp: 32  Height:      Weight:      SpO2: 94% 93% 94% 91%    Intake/Output Summary (Last 24 hours) at 11/15/15 0911 Last data filed at 11/15/15 0800  Gross per 24 hour  Intake   1986 ml  Output   2625 ml  Net   -639 ml   Filed Weights   11/13/15 0406 11/14/15 0355 11/15/15 0300  Weight: 89.4 kg (197 lb 1.5 oz) 87.408 kg (192 lb 11.2 oz) 87.544 kg (193 lb)    PHYSICAL EXAM  General: Dyspneic and tachypneic Neuro: Alert and oriented X 3. Moves all extremities spontaneously. Psych: Normal affect. HEENT:  Normal  Neck: Supple without bruits or JVD. Lungs:  Diffuse rhonchi and crackles  worse on the left  Heart: RRR no s3, s4, 2/6 SEM RUSB Abdomen: Soft, non-tender, non-distended, BS + x 4.  Extremities: No clubbing, cyanosis or edema. DP/PT/Radials 2+ and equal bilaterally.  Accessory Clinical Findings  CBC  Recent Labs  11/14/15 0346 11/15/15 0511  WBC 9.4 7.8  NEUTROABS  --  6.5  HGB 11.5* 12.1*  HCT 33.5* 35.5*  MCV 100.6* 99.7  PLT 177 167  Basic Metabolic Panel  Recent Labs  11/14/15 0346 11/15/15 0511  NA 140 139  K 3.4* 3.5  CL 102 100*  CO2 29 30  GLUCOSE 102* 142*  BUN 42* 42*  CREATININE 1.48* 1.61*  CALCIUM 8.4* 8.1*  MG  --  1.8   Liver Function Tests No results for input(s): AST, ALT, ALKPHOS, BILITOT, PROT, ALBUMIN in the last 72 hours. No results for input(s): LIPASE, AMYLASE in the last 72 hours. Cardiac Enzymes  Recent Labs  11/12/15 1441  TROPONINI 25.58*   BNP Invalid input(s): POCBNP D-Dimer No results for input(s): DDIMER in the last 72 hours. Hemoglobin A1C No results for input(s): HGBA1C in the last 72 hours. Fasting Lipid Panel No results for input(s): CHOL, HDL, LDLCALC, TRIG, CHOLHDL, LDLDIRECT in the last 72 hours. Thyroid Function Tests No results for input(s): TSH, T4TOTAL, T3FREE, THYROIDAB in the last 72  hours.  Invalid input(s): FREET3  TELE  normal sinus rhythm with PACs     Radiology/Studies  Dg Clavicle Left  11/07/2015  CLINICAL DATA:  Recent fall onto left shoulder with clavicular pain, initial encounter EXAM: LEFT CLAVICLE - 2+ VIEWS COMPARISON:  None. FINDINGS: There is no evidence of fracture or other focal bone lesions. Soft tissues are unremarkable. IMPRESSION: No acute abnormality noted. Electronically Signed   By: Alcide Clever M.D.   On: 11/07/2015 19:25   Ct Head Wo Contrast  11/13/2015  CLINICAL DATA:  Altered mental status.  Impaired orientation EXAM: CT HEAD WITHOUT CONTRAST TECHNIQUE: Contiguous axial images were obtained from the base of the skull through the vertex without intravenous contrast. COMPARISON:  08/13/2006 FINDINGS: Skull and Sinuses:Negative for fracture or destructive process. Mucosal thickening focally in the right maxillary antrum, without fluid level and presumed incidental given the history. Visualized orbits: Bilateral cataract resection Brain: No evidence of acute infarction, hemorrhage, hydrocephalus, or mass lesion/mass effect. Generalized cerebral volume loss which is normal for age. Normal cerebral white matter for age. IMPRESSION: 1. Normal intracranial imaging for age. 2. Chronic right maxillary sinusitis. Electronically Signed   By: Marnee Spring M.D.   On: 11/13/2015 04:53   Dg Chest Port 1 View  11/14/2015  CLINICAL DATA:  CHF, hypertension, diabetes mellitus, coronary artery disease post MI, PTCA and CABG EXAM: PORTABLE CHEST 1 VIEW COMPARISON:  Portable exam 0544 hours compared to 11/13/2015 FINDINGS: Enlargement of cardiac silhouette post CABG. Stable mediastinal contours. Atherosclerotic calcification aorta. Chronic elevation of RIGHT diaphragm with RIGHT basilar atelectasis. Diffuse pulmonary infiltrates bilaterally, asymmetrically greater on LEFT, question asymmetric edema versus infection. No gross pleural effusion or pneumothorax.  IMPRESSION: Enlargement of cardiac silhouette post CABG. Persistent asymmetric infiltrates LEFT greater than RIGHT question asymmetric edema versus pneumonia. Electronically Signed   By: Ulyses Southward M.D.   On: 11/14/2015 08:08   Dg Chest Port 1 View  11/13/2015  CLINICAL DATA:  Chest pain EXAM: PORTABLE CHEST - 1 VIEW COMPARISON:  11/11/2015 FINDINGS: Cardiac shadow is mildly enlarged but stable. Postoperative changes are again seen. Increasing bilateral infiltrates are noted consistent with progressive pulmonary edema. Elevation of the right hemidiaphragm is again seen. IMPRESSION: Increasing pulmonary edema bilaterally. Electronically Signed   By: Alcide Clever M.D.   On: 11/13/2015 08:29   Dg Chest Port 1 View  11/11/2015  CLINICAL DATA:  Fever and wheezing EXAM: PORTABLE CHEST - 1 VIEW COMPARISON:  10/28/2011 FINDINGS: Cardiac shadow is again enlarged. Postsurgical changes are again seen. Elevation the right hemidiaphragm is noted. Vascular congestion with patchy edematous changes  are seen this likely represents CHF although the possibility of superimposed infiltrate deserves consideration as well. IMPRESSION: Increased vascular congestion and patchy changes likely related to CHF. Electronically Signed   By: Alcide CleverMark  Lukens M.D.   On: 11/11/2015 21:50   Dg Shoulder Left  11/07/2015  CLINICAL DATA:  81 year old male with acute left shoulder pain following fall today. EXAM: LEFT SHOULDER - 2+ VIEW COMPARISON:  None. FINDINGS: There is no evidence of acute fracture, subluxation or dislocation. Degenerative changes at the glenohumeral joint noted. Visualized bony left hemi thorax is unremarkable. IMPRESSION: No acute bony abnormality. Electronically Signed   By: Harmon PierJeffrey  Hu M.D.   On: 11/07/2015 19:43   Dg Abd Portable 1v  11/14/2015  CLINICAL DATA:  Abdominal distension EXAM: PORTABLE ABDOMEN - 1 VIEW COMPARISON:  None. FINDINGS: Scattered large and small bowel gas is noted. No obstructive changes are  seen. Postoperative changes are noted in the lumbar spine and right hip. No abnormal mass or abnormal calcifications are seen. Multilevel degenerative changes in the lumbar spine are noted. No free air is seen. IMPRESSION: No acute abnormality noted. Electronically Signed   By: Alcide CleverMark  Lukens M.D.   On: 11/14/2015 12:05    ASSESSMENT AND PLAN Non-STEMI. Status post remote CABG. Cardiac cath 11/13/15 -->medical therapy. DAPT.  CAP (community acquired pneumonia) with acute respiratory failure Active Problems:  Essential hypertension  Diabetes mellitus with neurological manifestation (HCC)  Acute diastolic heart failure with ejection fraction 50-55% by echo  CKD (chronic kidney disease), stage III secondary to DMII Acute delirium  He has worsening respiratory failure due to progressive PNA. CXR and neck veins do not support HF. Renal function now getting slightly worse with diuretics too. Agree with bipap and may need intubation. I have discussed with [facility] and Dr. Molli KnockYacoub (CCM). CCM will assume care. Continue Plavix and ASA for demand NSTEMI. Ok to give one more dose IV lasix to see if it will help dry his lungs out a bit and improved oxygenation. Suspect BP will improve as his WOB is addressed.  Destan Franchini,MD 9:31 AM      Signed, Arvilla MeresBensimhon, Ronia Hazelett MD

## 2015-11-15 NOTE — Procedures (Signed)
OGT Insertion By MD  Done under direct visualization of laryngoscopy went in the esophagus and auscultated.  Dylan ReedyWesam G. Joie Barron, M.D. [facility]. Pager: [phone]. After hours pager: [phone].

## 2015-11-15 NOTE — Procedures (Signed)
Intubation Procedure Note Dylan BennettRichard J Barron [id number] [birth year]  Procedure: Intubation Indications: Respiratory insufficiency  Procedure Details Consent: Risks of procedure as well as the alternatives and risks of each were explained to the (patient/caregiver).  Consent for procedure obtained. Time Out: Verified patient identification, verified procedure, site/side was marked, verified correct patient position, special equipment/implants available, medications/allergies/relevent history reviewed, required imaging and test results available.  Performed  Maximum sterile technique was used including gloves, hand hygiene and mask.  MAC    Evaluation Hemodynamic Status: BP stable throughout; O2 sats: stable throughout Patient's Current Condition: stable Complications: No apparent complications Patient did tolerate procedure well. Chest X-ray ordered to verify placement.  CXR: pending.   Dylan BoundYACOUB,WESAM 11/15/2015

## 2015-11-15 NOTE — Progress Notes (Signed)
Nurse took pt off BIPAP for a while.  Pt complaining he could not tolerate it.

## 2015-11-15 NOTE — Consult Note (Signed)
PULMONARY / CRITICAL CARE MEDICINE   Name: [name] MRN: [id number] DOB: [birth year]    ADMISSION DATE:  11/11/2015 CONSULTATION DATE:  12/24   REFERRING MD:  triad   CHIEF COMPLAINT:  Chest pain/increased wob  HISTORY OF PRESENT ILLNESS:   Frail 81 yo remote smoker with diffuse CAD not amenable to invasive intervention , +NSEMI 12/21, left pna with worsening resp distress and intolerant of NIMVS. Suspect he will need elective intubation to prevent emergent intubation later on. Note he has been diuresed and may become hypotensive post intubation. PCCM will assume his care while he is intubated. He is a full code.  PAST MEDICAL HISTORY :  He  has a past medical history of Hypertension; Diabetes mellitus; Dyslipidemia; Coronary artery disease; Heart murmur; Renal calculi; Peripheral autonomic neuropathy due to diabetes mellitus (HCC); Cataract; Myocardial infarction [facility]); and NSTEMI (non-ST elevated myocardial infarction) (HCC) (11/12/2015).  PAST SURGICAL HISTORY: He  has past surgical history that includes Lithotripsy; US ECHOCARDIOGRAPHY (01-16-09); Cardiovascular stress test (09-26-03); Cardiac catheterization; Coronary artery bypass graft; Coronary angioplasty with stent; Tonsillectomy; Cataract extraction w/ intraocular lens implant; Back surgery; Total hip arthroplasty (10/29/2011); Joint replacement; Eye surgery; Spine surgery; and Cardiac catheterization (N/A, 11/13/2015).  Allergies  Allergen Reactions  . Morphine And Related     Altered mental status  . Tetracyclines & Related     "Raw throat"    No current facility-administered medications on file prior to encounter.   Current Outpatient Prescriptions on File Prior to Encounter  Medication Sig  . amLODipine (NORVASC) 2.5 MG tablet TAKE 1 TABLET BY MOUTH EVERY DAY  . atenolol (TENORMIN) 50 MG tablet TAKE 1 TABLET BY MOUTH DAILY  . esomeprazole (NEXIUM) 40 MG capsule Take 40 mg by mouth daily before breakfast.    .  fenofibrate 160 MG tablet Take 160 mg by mouth daily.    Marland Kitchen gabapentin (NEURONTIN) 300 MG capsule Take 300 mg by mouth 2 (two) times daily.   . Insulin Glargine (LANTUS [place]) Inject 40 Units into the skin at bedtime.   . Insulin Lispro, Human, (HUMALOG PEN ) Inject into the skin. Slide Scale  . isosorbide mononitrate (IMDUR) 30 MG 24 hr tablet Take 1 tablet (30 mg total) by mouth daily.  Marland Kitchen lisinopril (PRINIVIL,ZESTRIL) 20 MG tablet Take 20 mg by mouth daily.  Marland Kitchen RANEXA 1000 MG SR tablet TAKE 1 TABLET BY MOUTH TWICE A DAY  . simvastatin (ZOCOR) 20 MG tablet TAKE 1 TABLET BY MOUTH AT BEDTIME  . traMADol (ULTRAM) 50 MG tablet Take 50 mg by mouth 2 (two) times daily.  Marland Kitchen aspirin EC 81 MG tablet Take 1 tablet (81 mg total) by mouth daily. (Patient not taking: Reported on 11/07/2015)  . [DISCONTINUED] ferrous sulfate 325 (65 FE) MG tablet Take 1 tablet (325 mg total) by mouth 3 (three) times daily after meals.    FAMILY HISTORY:  His indicated that his mother is deceased. He indicated that his father is deceased. He indicated that his sister is alive. He indicated that his maternal grandmother is deceased. He indicated that his maternal grandfather is deceased. He indicated that his paternal grandmother is deceased. He indicated that his paternal grandfather is deceased.   SOCIAL HISTORY: He  reports that he has quit smoking. His smoking use included Pipe. He has never used smokeless tobacco. He reports that he does not drink alcohol or use illicit drugs.  REVIEW OF SYSTEMS:   10 point review of system taken, please see HPI for  positives and negatives.  SUBJECTIVE:  In resp distress  VITAL SIGNS: BP 132/48 mmHg  Pulse 65  Temp(Src) 98.6 F (37 C) (Oral)  Resp 32  Ht  (1.753 m)  Wt 193 lb (87.544 kg)  BMI 28.49 kg/m2  SpO2 94%  HEMODYNAMICS:    VENTILATOR SETTINGS:    INTAKE / OUTPUT: I/O last 3 completed shifts: In: 3296 [P.O.:2020; I.V.:676; IV Piggyback:600] Out: 2915  [Urine:2915]  PHYSICAL EXAMINATION: General:  Frail elderly male in moderate resp distress Neuro: Intact HOH HEENT: No JVD/LAN Cardiovascular: HSR  Lungs:  Rhonchi/crackles left Abdomen:  Soft +bs Musculoskeletal:  Intact Skin:  warm  LABS:  BMET  Recent Labs Lab 11/13/15 0226 11/14/15 0346 11/15/15 0511  NA 139 140 139  K 3.8 3.4* 3.5  CL 100* 102 100*  CO2 BUN 42* 42* 42*  CREATININE 1.43* 1.48* 1.61*  GLUCOSE 129* 102* 142*    Electrolytes  Recent Labs Lab 11/13/15 0226 11/14/15 0346 11/15/15 0511  CALCIUM 9.1 8.4* 8.1*  MG  --   --  1.8    CBC  Recent Labs Lab 11/13/15 0226 11/14/15 0346 11/15/15 0511  WBC 12.6* 9.4 7.8  HGB 11.7* 11.5* 12.1*  HCT 35.7* 33.5* 35.5*  PLT 229 177 167    Coag's  Recent Labs Lab 11/11/15 2145 11/13/15 0226  APTT 30  --   INR 1.15 1.32    Sepsis Markers  Recent Labs Lab 11/11/15 2200 11/12/15 0335 11/13/15 0824 11/13/15 0942 11/13/15 1140 11/14/15 0346  LATICACIDVEN 1.47  --  1.1  --  1.1  --   PROCALCITON  --  <0.10  --  <0.10  --  0.10    ABG  Recent Labs Lab 11/13/15 0854  PHART 7.504*  PCO2ART 34.8*  PO2ART 44.0*    Liver Enzymes  Recent Labs Lab 11/11/15 2145  AST 27  ALT 14*  ALKPHOS 42  BILITOT 0.7  ALBUMIN 4.0    Cardiac Enzymes  Recent Labs Lab 11/12/15 0335 11/12/15 0819 11/12/15 1441  TROPONINI 18.18* 25.52* 25.58*    Glucose  Recent Labs Lab 11/14/15 1703 11/14/15 2001 11/14/15 2339 11/15/15 0354 11/15/15 0811 11/15/15 1133  GLUCAP 178* 208* 175* 124* 131* 174*    Imaging Dg Chest Port 1 View  11/15/2015  CLINICAL DATA:  81 year old male with congestive heart failure EXAM: PORTABLE CHEST 1 VIEW COMPARISON:  Prior chest x-ray 11/14/2015 FINDINGS: Stable cardiac and mediastinal contours. Patient is status post median sternotomy with evidence of prior multivessel CABG. Similar to perhaps incrementally improved interstitial pulmonary edema  with slight clearing in the right mid lung. There is persistent asymmetrically dense opacification of the left mid lung. Probable small bilateral pleural effusions. No pneumothorax. No acute osseous abnormality. IMPRESSION: 1. Perhaps incrementally improved interstitial pulmonary edema. 2. Persistent asymmetric patchy airspace opacity in the left mid lung concerning for either superimposed pneumonia, or (less likely) asymmetric pulmonary edema. Electronically Signed   By: Malachy Moan M.D.   On: 11/15/2015 09:09     STUDIES:    CULTURES: 12/24 sputum>> 12/24 bc x2>>  ANTIBIOTICS: 12/22 fortaz>> 12/22 vanc>>  SIGNIFICANT EVENTS: 12/21 NSTEMI 12/22 CC Diffuse dx  LINES/TUBES: 12/24 ETT>>  DISCUSSION: Frail 81 yo remote smoker with diffuse CAD not amenable to invasive intervention , +NSEMI 12/21, left pna with worsening resp distress and intolerant of NIMVS. Suspect he will need elective intubation to prevent emergent intubation later on. Note he has been diuresed and may become hypotensive  post intubation. PCCM will assume his care while he is intubated. He is a full code.   ASSESSMENT / PLAN:  PULMONARY A: Dense left PNA. Failed NIMVS Recent MI P:   Plan for intubation Sputum culture ABX  CARDIOVASCULAR A:  Recent NSTEMI Diffuse dx by cc 12/22 Cards following P:  Watch for hypotension once sedated Plavix LMWH  RENAL Lab Results  Component Value Date   CREATININE 1.61* 11/15/2015   CREATININE 1.48* 11/14/2015   CREATININE 1.43* 11/13/2015    A:   RI on aggressive lasix P:   Follow creatine  GASTROINTESTINAL A:   GI protection P:   PPI TF within 24 hours  HEMATOLOGIC A:   No acute issue P:    INFECTIOUS A:   Left Pna P:   fortaz and vanco day 2/x 12/24 sputum>>  ENDOCRINE A:   DM  P:   SSI  NEUROLOGIC A:   Plan for sedation for tube tolerance(Diprivan may not be best choice with recent MI, agressive diuresis) Interment Fentanyl    P:   RASS goal: -1 Sedation as needed   FAMILY  - Updates: Wife updated at bedside and she and patient agree with intubation  - Inter-disciplinary family meet or Palliative Care meeting due by:  day 7    [facility] ACNP [name] PCCM Pager [phone] till 3 pm If no answer page [phone]  Attending Note:  81 year old male with PMH of CHF, had an MI then developed a left sided PNA.  With PNA developed VDRF and was PCCM was asked to evaluate.  Crackles L>R on exam.  I reviewed CXR myself, left sided infiltrate and cardiomegally.  Will intubate, mechanically ventilate.  I spoke with patient, he is ok with full code for now but no trach/peg.  His wife was there bedside and has agreed to uphold his wishes.  Agree with abx.  Keep dry as able and will call nutrition for TF.  The patient is critically ill with multiple organ systems failure and requires high complexity decision making for assessment and support, frequent evaluation and titration of therapies, application of advanced monitoring technologies and extensive interpretation of multiple databases.   Critical Care Time devoted to patient care services described in this note is  45  Minutes. This time reflects time of care of this signee Dr [name]. This critical care time does not reflect procedure time, or teaching time or supervisory time of PA/NP/Med student/Med Resident etc but could involve care discussion time.  Alyson ReedyWesam G. Barron, M.D. [facility] Pulmonary/Critical Care Medicine. Pager: [phone]. After hours pager: [phone].  11/15/2015, 12:51 PM

## 2015-11-16 DIAGNOSIS — N179 Acute kidney failure, unspecified: Secondary | ICD-10-CM

## 2015-11-16 DIAGNOSIS — R6521 Severe sepsis with septic shock: Secondary | ICD-10-CM

## 2015-11-16 DIAGNOSIS — I5041 Acute combined systolic (congestive) and diastolic (congestive) heart failure: Secondary | ICD-10-CM

## 2015-11-16 DIAGNOSIS — A419 Sepsis, unspecified organism: Secondary | ICD-10-CM

## 2015-11-16 LAB — BASIC METABOLIC PANEL
ANION GAP: 10 (ref 5–15)
BUN: 57 mg/dL — AB (ref 6–20)
CHLORIDE: 98 mmol/L — AB (ref 101–111)
CO2: 29 mmol/L (ref 22–32)
Calcium: 8 mg/dL — ABNORMAL LOW (ref 8.9–10.3)
Creatinine, Ser: 2.6 mg/dL — ABNORMAL HIGH (ref 0.61–1.24)
GFR calc Af Amer: 24 mL/min — ABNORMAL LOW (ref >60)
GFR, EST NON AFRICAN AMERICAN: 21 mL/min — AB (ref >60)
GLUCOSE: 195 mg/dL — AB (ref 65–99)
POTASSIUM: 3.7 mmol/L (ref 3.5–5.1)
SODIUM: 137 mmol/L (ref 135–145)

## 2015-11-16 LAB — GLUCOSE, CAPILLARY
GLUCOSE-CAPILLARY: 122 mg/dL — AB (ref 65–99)
GLUCOSE-CAPILLARY: 150 mg/dL — AB (ref 65–99)
GLUCOSE-CAPILLARY: 152 mg/dL — AB (ref 65–99)
Glucose-Capillary: 148 mg/dL — ABNORMAL HIGH (ref 65–99)
Glucose-Capillary: 149 mg/dL — ABNORMAL HIGH (ref 65–99)
Glucose-Capillary: 147 mg/dL — ABNORMAL HIGH (ref 65–99)
Glucose-Capillary: 117 mg/dL — ABNORMAL HIGH (ref 65–99)

## 2015-11-16 LAB — CULTURE, BLOOD (ROUTINE X 2): CULTURE: NO GROWTH

## 2015-11-16 LAB — CBC
HCT: 35.3 % — ABNORMAL LOW (ref 39.0–52.0)
HEMOGLOBIN: 12.2 g/dL — AB (ref 13.0–17.0)
MCH: 34 pg (ref 26.0–34.0)
MCHC: 34.6 g/dL (ref 30.0–36.0)
MCV: 98.3 fL (ref 78.0–100.0)
Platelets: 168 10*3/uL (ref 150–400)
RBC: 3.59 MIL/uL — AB (ref 4.22–5.81)
RDW: 13.9 % (ref 11.5–15.5)
WBC: 7.1 10*3/uL (ref 4.0–10.5)

## 2015-11-16 LAB — VANCOMYCIN, TROUGH: VANCOMYCIN TR: 31 ug/mL — AB (ref 10.0–20.0)

## 2015-11-16 LAB — PROCALCITONIN: Procalcitonin: 0.25 ng/mL

## 2015-11-16 MED ORDER — VANCOMYCIN HCL IN DEXTROSE 1-5 GM/200ML-% IV SOLN
1000.0000 mg | INTRAVENOUS | Status: DC
  Administered 2015-11-17: 1000 mg via INTRAVENOUS
  Filled 2015-11-16: qty 200

## 2015-11-16 MED ORDER — CEFTAZIDIME 1 G IJ SOLR
1.0000 g | INTRAMUSCULAR | Status: DC
  Administered 2015-11-17: 1 g via INTRAVENOUS
  Filled 2015-11-16 (×2): qty 1

## 2015-11-16 MED ORDER — SODIUM CHLORIDE 0.9 % IV BOLUS (SEPSIS)
500.0000 mL | Freq: Once | INTRAVENOUS | Status: AC
  Administered 2015-11-16: 500 mL via INTRAVENOUS

## 2015-11-16 MED ORDER — SODIUM CHLORIDE 0.9 % IV SOLN
INTRAVENOUS | Status: DC
  Administered 2015-11-16 – 2015-11-17 (×3): 75 mL/h via INTRAVENOUS
  Administered 2015-11-18: 05:00:00 via INTRAVENOUS

## 2015-11-16 NOTE — Progress Notes (Addendum)
ANTICOAGULATION and ANTIBIOTIC CONSULT NOTE - Follow Up Consult  Pharmacy Consult for Vancomycin, Elita QuickFortaz  Indication: chest pain/ACS, suspected CAP  Allergies  Allergen Reactions  . Morphine And Related     Altered mental status  . Tetracyclines & Related     "Raw throat"    Patient Measurements: Height: 5\' 9"  (175.3 cm) Weight: 174 lb (78.926 kg) IBW/kg (Calculated) : 70.7  Vital Signs: Temp: 101.9 F (38.8 C) (12/25 0734) Temp Source: Oral (12/25 0734) BP: 131/54 mmHg (12/25 0800) Pulse Rate: 57 (12/25 0800)  Labs:  Recent Labs  11/13/15 1140  11/14/15 0346 11/15/15 0511 11/16/15 0500  HGB  --   < > 11.5* 12.1* 12.2*  HCT  --   --  33.5* 35.5* 35.3*  PLT  --   --  177 167 168  HEPARINUNFRC 0.60  --   --   --   --   CREATININE  --   --  1.48* 1.61* 2.60*  < > = values in this interval not displayed.  Estimated Creatinine Clearance: 20.8 mL/min (by C-G formula based on Cr of 2.6).   Assessment: 81 [facility]. Then had increase in WBC, with Tmax 101.8, CXR showing pulmonary edema consistent w/ CHF. Pt also has AKI w/ SCR 1.34 > 1.43 > 2.60, CrCl 20.8. Pt was initiated on levofloxacin, which was changed to vancomycin and ceftazidime.  Levaquin 12/20>>12/22  Vanc 12/22>>  Elita QuickFortaz 12/22>>   12/21 BCx x2: NG<12hr  12/21 UCx: NG  MRSA PCR: neg  Flu panel: neg   Goal of Therapy:  Heparin level 0.3-0.7 units/ml Monitor platelets by anticoagulation protocol: Yes   Plan:  - Hold Vancomycin today, then decrease Vancomycin to 1000mg  Q48H - Decrease fortaz 1g IV Q24H - Trough at Css if therapy to continue - F/U cultures, LOT, renal fxn, clinical improvement   Sherron MondayAubrey N. Marda Breidenbach, PharmD Clinical Pharmacy Resident Pager: [phone] 11/16/2015 10:38 AM

## 2015-11-16 NOTE — Progress Notes (Signed)
PULMONARY / CRITICAL CARE MEDICINE   Name: [name] MRN: [id number] DOB: [birth year]    ADMISSION DATE:  11/11/2015 CONSULTATION DATE:  12/24   REFERRING MD:  triad   CHIEF COMPLAINT:  Chest pain/increased wob  HISTORY OF PRESENT ILLNESS:   Frail 81 yo remote smoker with diffuse CAD not amenable to invasive intervention , +NSEMI 12/21, left pna with worsening resp distress and intolerant of NIMVS. Suspect he will need elective intubation to prevent emergent intubation later on. Note he has been diuresed and may become hypotensive post intubation. PCCM will assume his care while he is intubated. He is a full code.  PAST MEDICAL HISTORY :  He  has a past medical history of Hypertension; Diabetes mellitus; Dyslipidemia; Coronary artery disease; Heart murmur; Renal calculi; Peripheral autonomic neuropathy due to diabetes mellitus (HCC); Cataract; Myocardial infarction [facility]); and NSTEMI (non-ST elevated myocardial infarction) (HCC) (11/12/2015).  PAST SURGICAL HISTORY: He  has past surgical history that includes Lithotripsy; US ECHOCARDIOGRAPHY (01-16-09); Cardiovascular stress test (09-26-03); Cardiac catheterization; Coronary artery bypass graft; Coronary angioplasty with stent; Tonsillectomy; Cataract extraction w/ intraocular lens implant; Back surgery; Total hip arthroplasty (10/29/2011); Joint replacement; Eye surgery; Spine surgery; and Cardiac catheterization (N/A, 11/13/2015).  Allergies  Allergen Reactions  . Morphine And Related     Altered mental status  . Tetracyclines & Related     "Raw throat"    No current facility-administered medications on file prior to encounter.   Current Outpatient Prescriptions on File Prior to Encounter  Medication Sig  . amLODipine (NORVASC) 2.5 MG tablet TAKE 1 TABLET BY MOUTH EVERY DAY  . atenolol (TENORMIN) 50 MG tablet TAKE 1 TABLET BY MOUTH DAILY  . esomeprazole (NEXIUM) 40 MG capsule Take 40 mg by mouth daily before breakfast.    .  fenofibrate 160 MG tablet Take 160 mg by mouth daily.    Marland Kitchen gabapentin (NEURONTIN) 300 MG capsule Take 300 mg by mouth 2 (two) times daily.   . Insulin Glargine (LANTUS [place]) Inject 40 Units into the skin at bedtime.   . Insulin Lispro, Human, (HUMALOG PEN [place]) Inject into the skin. Slide Scale  . isosorbide mononitrate (IMDUR) 30 MG 24 hr tablet Take 1 tablet (30 mg total) by mouth daily.  Marland Kitchen lisinopril (PRINIVIL,ZESTRIL) 20 MG tablet Take 20 mg by mouth daily.  Marland Kitchen RANEXA 1000 MG SR tablet TAKE 1 TABLET BY MOUTH TWICE A DAY  . simvastatin (ZOCOR) 20 MG tablet TAKE 1 TABLET BY MOUTH AT BEDTIME  . traMADol (ULTRAM) 50 MG tablet Take 50 mg by mouth 2 (two) times daily.  Marland Kitchen aspirin EC 81 MG tablet Take 1 tablet (81 mg total) by mouth daily. (Patient not taking: Reported on 11/07/2015)  . [DISCONTINUED] ferrous sulfate 325 (65 FE) MG tablet Take 1 tablet (325 mg total) by mouth 3 (three) times daily after meals.    FAMILY HISTORY:  His indicated that his mother is deceased. He indicated that his father is deceased. He indicated that his sister is alive. He indicated that his maternal grandmother is deceased. He indicated that his maternal grandfather is deceased. He indicated that his paternal grandmother is deceased. He indicated that his paternal grandfather is deceased.   SOCIAL HISTORY: He  reports that he has quit smoking. His smoking use included Pipe. He has never used smokeless tobacco. He reports that he does not drink alcohol or use illicit drugs.  REVIEW OF SYSTEMS:   10 point review of system taken, please see HPI for  positives and negatives.  SUBJECTIVE:  In resp distress  VITAL SIGNS: BP 131/54 mmHg  Pulse 57  Temp(Src) 101.9 F (38.8 C) (Oral)  Resp 22  Ht  (1.753 m)  Wt 78.926 kg (174 lb)  BMI 25.68 kg/m2  SpO2 97%  HEMODYNAMICS:    VENTILATOR SETTINGS: Vent Mode:  [-] CPAP;PSV FiO2 (%):  [40 %-50 %] 40 % Set Rate:  [15 bmp] 15 bmp Vt Set:  [560 mL] 560 mL PEEP:   [5 cmH20] 5 cmH20 Pressure Support:  [10 cmH20] 10 cmH20 Plateau Pressure:  [20 cmH20-21 cmH20] 21 cmH20  INTAKE / OUTPUT: I/O last 3 completed shifts: In: 3296.4 [P.O.:840; I.V.:656.4; NG/GT:150; IV Piggyback:1650] Out: 2510 [Urine:2480; Emesis/NG output:30]  PHYSICAL EXAMINATION: General:  Frail elderly male in moderate resp distress Neuro: Sedated but withdraws to pain. HEENT: [place]/AT, PERRL, EOM-I and MMM Cardiovascular: RRR, Nl S1/S2, -M/R/G. Lungs:  L>R crackles. Abdomen:  Soft, NT, ND and +BS. Musculoskeletal:  -edema and -tenderness. Skin:  warm  LABS:  BMET  Recent Labs Lab 11/14/15 0346 11/15/15 0511 11/16/15 0500  NA 140 139 137  K 3.4* 3.5 3.7  CL 102 100* 98*  CO2 BUN 42* 42* 57*  CREATININE 1.48* 1.61* 2.60*  GLUCOSE 102* 142* 195*   Electrolytes  Recent Labs Lab 11/14/15 0346 11/15/15 0511 11/16/15 0500  CALCIUM 8.4* 8.1* 8.0*  MG  --  1.8  --    CBC  Recent Labs Lab 11/14/15 0346 11/15/15 0511 11/16/15 0500  WBC 9.4 7.8 7.1  HGB 11.5* 12.1* 12.2*  HCT 33.5* 35.5* 35.3*  PLT 177 167 168   Coag's  Recent Labs Lab 11/11/15 2145 11/13/15 0226  APTT 30  --   INR 1.15 1.32   Sepsis Markers  Recent Labs Lab 11/11/15 2200  11/13/15 0824 11/13/15 0942 11/13/15 1140 11/14/15 0346 11/16/15 0500  LATICACIDVEN 1.47  --  1.1  --  1.1  --   --   PROCALCITON  --   < >  --  <0.10  --  0.10 0.25  < > = values in this interval not displayed.  ABG  Recent Labs Lab 11/13/15 0854 11/15/15 1454  PHART 7.504* 7.485*  PCO2ART 34.8* 37.0  PO2ART 44.0* 60.0*   Liver Enzymes  Recent Labs Lab 11/11/15 2145  AST 27  ALT 14*  ALKPHOS 42  BILITOT 0.7  ALBUMIN 4.0   Cardiac Enzymes  Recent Labs Lab 11/12/15 0335 11/12/15 0819 11/12/15 1441  TROPONINI 18.18* 25.52* 25.58*   Glucose  Recent Labs Lab 11/15/15 1133 11/15/15 1548 11/15/15 1931 11/16/15 0030 11/16/15 0436 11/16/15 0733  GLUCAP 174* 166* 148*  150* 152* 149*   Imaging Dg Chest Port 1 View  11/15/2015  CLINICAL DATA:  Difficult intubation EXAM: PORTABLE CHEST 1 VIEW COMPARISON:  Radiograph 11/15/2015 FINDINGS: Endotracheal tube positioned 4 cm from carina. NG tube extends into the stomach. Normal cardiac silhouette. There is perihilar airspace disease greater on the LEFT similar prior. No pneumothorax. IMPRESSION: 1. Endotracheal tube appears in good position. 2. Stable bilateral airspace disease greater on the LEFT. Electronically Signed   By: Genevive Bi M.D.   On: 11/15/2015 13:57   Dg Abd Portable 1v  11/15/2015  CLINICAL DATA:  OG tube placement EXAM: PORTABLE ABDOMEN - 1 VIEW COMPARISON:  11/14/2015 FINDINGS: The enteric tube tip is in the body of the stomach. Side port is below GE junction. The bowel gas pattern is unremarkable. IMPRESSION: 1. Tip of the  enteric tube is in the stomach with side port below GE junction. Electronically Signed   By: Signa Kellaylor  Stroud M.D.   On: 11/15/2015 17:03   STUDIES:    CULTURES: 12/24 sputum>> 12/24 bc x2>>  ANTIBIOTICS: 12/22 fortaz>> 12/22 vanc>>  SIGNIFICANT EVENTS: 12/21 NSTEMI 12/22 CC Diffuse dx  LINES/TUBES: 12/24 ETT>>  DISCUSSION: Frail 81 yo remote smoker with diffuse CAD not amenable to invasive intervention , +NSEMI 12/21, left pna with worsening resp distress and intolerant of NIMVS. Suspect he will need elective intubation to prevent emergent intubation later on. Note he has been diuresed and may become hypotensive post intubation. PCCM will assume his care while he is intubated. He is a full code.   ASSESSMENT / PLAN:  PULMONARY A: Dense left PNA. Failed NIMVS Recent MI P:   Continue full vent support. ABG and CXR in AM. ABX as above.  CARDIOVASCULAR A:  Recent NSTEMI Diffuse dx by cc 12/22 Cards following P:  Dopamine. IVF. Plavix LMWH Cards following.  RENAL Lab Results  Component Value Date   CREATININE 2.60* 11/16/2015   CREATININE  1.61* 11/15/2015   CREATININE 1.48* 11/14/2015    A:   Deteriorating renal function. P:   Follow creatine Start IVF D/C lasix.  GASTROINTESTINAL A:   GI protection P:   PPI. TF per nutrition.  HEMATOLOGIC A:   No acute issue P:  CBC in AM. Transfuse per ICU protocol.  INFECTIOUS A:   Left Pna P:   Fortaz and vanco day 3/x 12/24 sputum>>  ENDOCRINE A:   DM  P:   SSI  NEUROLOGIC A:   Plan for sedation for tube tolerance(Diprivan may not be best choice with recent MI, agressive diuresis) Interment Fentanyl   P:   RASS goal: -1 Fentanyl drip as ordered.  FAMILY  - Updates: Wife updated at bedside.  - Inter-disciplinary family meet or Palliative Care meeting due by:  day 7  The patient is critically ill with multiple organ systems failure and requires high complexity decision making for assessment and support, frequent evaluation and titration of therapies, application of advanced monitoring technologies and extensive interpretation of multiple databases.   Critical Care Time devoted to patient care services described in this note is  45  Minutes. This time reflects time of care of this signee Dr Koren BoundWesam Petrea [place]enburg. This critical care time does not reflect procedure time, or teaching time or supervisory time of PA/NP/Med student/Med Resident etc but could involve care discussion time.  Alyson ReedyWesam G. Zakaria Fromer, M.D. [facility] Pulmonary/Critical Care Medicine. Pager: [phone]. After hours pager: [phone].  11/16/2015, 9:48 AM

## 2015-11-16 NOTE — Progress Notes (Signed)
Patient Name: [name] Date of Encounter: 11/16/2015     Principal Problem:   CAP (community acquired pneumonia) Active Problems:   Essential hypertension   Diabetes mellitus with neurological manifestation (HCC)   CHF (congestive heart failure) (HCC)   Elevated troponin   CKD (chronic kidney disease), stage III   Altered mental state   NSTEMI (non-ST elevated myocardial infarction) (HCC)   Acute congestive heart failure (HCC)   Pressure ulcer   Acute on chronic diastolic CHF (congestive heart failure) (HCC)   Acute respiratory failure with hypoxia (HCC)    SUBJECTIVE  Underwent cath 12/22 with severe CAD. Only LIMA patent. SVGs occluded. Recommended medical therapy. IV lasix increased yesterday. Weight unchanged. Creatinine bumping.   Intubated yesterday for respiratory failure from PNA. Awake on vent at 40% FiO2. Denies CP. Weight stable.   CURRENT MEDS . amLODipine  2.5 mg Per NG tube Daily  . antiseptic oral rinse  7 mL Mouth Rinse BID  . antiseptic oral rinse  7 mL Mouth Rinse QID  . atenolol  50 mg Per NG tube Daily  . [START ON 11/17/2015] cefTAZidime (FORTAZ)  IV  1 g Intravenous Q24H  . chlorhexidine gluconate  15 mL Mouth Rinse BID  . clopidogrel  75 mg Oral Daily  . enoxaparin (LOVENOX) injection  30 mg Subcutaneous Q24H  . gabapentin  300 mg Oral BID  . insulin aspart  0-9 Units Subcutaneous 6 times per day  . insulin glargine  20 Units Subcutaneous QHS  . isosorbide mononitrate  30 mg Oral Daily  . lisinopril  20 mg Per NG tube Daily  . pantoprazole sodium  40 mg Per Tube Q1200  . potassium chloride  20 mEq Per Tube BID  . ranolazine  1,000 mg Oral BID  . sodium chloride  3 mL Intravenous Q12H  . sodium chloride  3 mL Intravenous Q12H  . [START ON 11/17/2015] vancomycin  1,000 mg Intravenous Q48H    OBJECTIVE  Filed Vitals:   11/16/15 0731 11/16/15 0734 11/16/15 0800 11/16/15 1112  BP: 158/63  131/54 151/63  Pulse: 65  57 67  Temp:   101.9 F (38.8 C)    TempSrc:  Oral    Resp: Height:      Weight:      SpO2: 100%  97% 97%    Intake/Output Summary (Last 24 hours) at 11/16/15 1118 Last data filed at 11/16/15 0800  Gross per 24 hour  Intake   1890 ml  Output   1945 ml  Net    -55 ml   Filed Weights   11/15/15 0300 11/16/15 0000 11/16/15 0120  Weight: 87.544 kg (193 lb) 78.926 kg (174 lb) 78.926 kg (174 lb)    PHYSICAL EXAM  General: Awake on vent.  Neuro: Alert and follows commands Moves all extremities spontaneously. Psych: Normal affect. HEENT:  Normal x for ETT  Neck: Supple without bruits or JVD. Lungs:  Diffuse rhonchi and crackles  worse on the left  Heart: RRR no s3, s4, 2/6 SEM RUSB Abdomen: Soft, non-tender, non-distended, BS + x 4.  Extremities: No clubbing, cyanosis or edema. DP/PT/Radials 2+ and equal bilaterally.  Accessory Clinical Findings  CBC  Recent Labs  11/15/15 0511 11/16/15 0500  WBC 7.8 7.1  NEUTROABS 6.5  --   HGB 12.1* 12.2*  HCT 35.5* 35.3*  MCV 99.7 98.3  PLT 167 168   Basic Metabolic Panel  Recent Labs  11/15/15 0511 11/16/15  0500  NA 139 137  K 3.5 3.7  CL 100* 98*  CO2 30 29  GLUCOSE 142* 195*  BUN 42* 57*  CREATININE 1.61* 2.60*  CALCIUM 8.1* 8.0*  MG 1.8  --    Liver Function Tests No results for input(s): AST, ALT, ALKPHOS, BILITOT, PROT, ALBUMIN in the last 72 hours. No results for input(s): LIPASE, AMYLASE in the last 72 hours. Cardiac Enzymes No results for input(s): CKTOTAL, CKMB, CKMBINDEX, TROPONINI in the last 72 hours. BNP Invalid input(s): POCBNP D-Dimer No results for input(s): DDIMER in the last 72 hours. Hemoglobin A1C No results for input(s): HGBA1C in the last 72 hours. Fasting Lipid Panel No results for input(s): CHOL, HDL, LDLCALC, TRIG, CHOLHDL, LDLDIRECT in the last 72 hours. Thyroid Function Tests No results for input(s): TSH, T4TOTAL, T3FREE, THYROIDAB in the last 72 hours.  Invalid input(s):  FREET3  TELE  normal sinus rhythm with PACs     Radiology/Studies  Dg Clavicle Left  11/07/2015  CLINICAL DATA:  Recent fall onto left shoulder with clavicular pain, initial encounter EXAM: LEFT CLAVICLE - 2+ VIEWS COMPARISON:  None. FINDINGS: There is no evidence of fracture or other focal bone lesions. Soft tissues are unremarkable. IMPRESSION: No acute abnormality noted. Electronically Signed   By: Alcide Clever M.D.   On: 11/07/2015 19:25   Ct Head Wo Contrast  11/13/2015  CLINICAL DATA:  Altered mental status.  Impaired orientation EXAM: CT HEAD WITHOUT CONTRAST TECHNIQUE: Contiguous axial images were obtained from the base of the skull through the vertex without intravenous contrast. COMPARISON:  08/13/2006 FINDINGS: Skull and Sinuses:Negative for fracture or destructive process. Mucosal thickening focally in the right maxillary antrum, without fluid level and presumed incidental given the history. Visualized orbits: Bilateral cataract resection Brain: No evidence of acute infarction, hemorrhage, hydrocephalus, or mass lesion/mass effect. Generalized cerebral volume loss which is normal for age. Normal cerebral white matter for age. IMPRESSION: 1. Normal intracranial imaging for age. 2. Chronic right maxillary sinusitis. Electronically Signed   By: Marnee Spring M.D.   On: 11/13/2015 04:53   Dg Chest Port 1 View  11/15/2015  CLINICAL DATA:  Difficult intubation EXAM: PORTABLE CHEST 1 VIEW COMPARISON:  Radiograph 11/15/2015 FINDINGS: Endotracheal tube positioned 4 cm from carina. NG tube extends into the stomach. Normal cardiac silhouette. There is perihilar airspace disease greater on the LEFT similar prior. No pneumothorax. IMPRESSION: 1. Endotracheal tube appears in good position. 2. Stable bilateral airspace disease greater on the LEFT. Electronically Signed   By: Genevive Bi M.D.   On: 11/15/2015 13:57   Dg Chest Port 1 View  11/15/2015  CLINICAL DATA:  81 year old male with  congestive heart failure EXAM: PORTABLE CHEST 1 VIEW COMPARISON:  Prior chest x-ray 11/14/2015 FINDINGS: Stable cardiac and mediastinal contours. Patient is status post median sternotomy with evidence of prior multivessel CABG. Similar to perhaps incrementally improved interstitial pulmonary edema with slight clearing in the right mid lung. There is persistent asymmetrically dense opacification of the left mid lung. Probable small bilateral pleural effusions. No pneumothorax. No acute osseous abnormality. IMPRESSION: 1. Perhaps incrementally improved interstitial pulmonary edema. 2. Persistent asymmetric patchy airspace opacity in the left mid lung concerning for either superimposed pneumonia, or (less likely) asymmetric pulmonary edema. Electronically Signed   By: Malachy Moan M.D.   On: 11/15/2015 09:09   Dg Chest Port 1 View  11/14/2015  CLINICAL DATA:  CHF, hypertension, diabetes mellitus, coronary artery disease post MI, PTCA and CABG EXAM: PORTABLE CHEST  1 VIEW COMPARISON:  Portable exam 0544 hours compared to 11/13/2015 FINDINGS: Enlargement of cardiac silhouette post CABG. Stable mediastinal contours. Atherosclerotic calcification aorta. Chronic elevation of RIGHT diaphragm with RIGHT basilar atelectasis. Diffuse pulmonary infiltrates bilaterally, asymmetrically greater on LEFT, question asymmetric edema versus infection. No gross pleural effusion or pneumothorax. IMPRESSION: Enlargement of cardiac silhouette post CABG. Persistent asymmetric infiltrates LEFT greater than RIGHT question asymmetric edema versus pneumonia. Electronically Signed   By: Ulyses Southward M.D.   On: 11/14/2015 08:08   Dg Chest Port 1 View  11/13/2015  CLINICAL DATA:  Chest pain EXAM: PORTABLE CHEST - 1 VIEW COMPARISON:  11/11/2015 FINDINGS: Cardiac shadow is mildly enlarged but stable. Postoperative changes are again seen. Increasing bilateral infiltrates are noted consistent with progressive pulmonary edema. Elevation of  the right hemidiaphragm is again seen. IMPRESSION: Increasing pulmonary edema bilaterally. Electronically Signed   By: Alcide Clever M.D.   On: 11/13/2015 08:29   Dg Chest Port 1 View  11/11/2015  CLINICAL DATA:  Fever and wheezing EXAM: PORTABLE CHEST - 1 VIEW COMPARISON:  10/28/2011 FINDINGS: Cardiac shadow is again enlarged. Postsurgical changes are again seen. Elevation the right hemidiaphragm is noted. Vascular congestion with patchy edematous changes are seen this likely represents CHF although the possibility of superimposed infiltrate deserves consideration as well. IMPRESSION: Increased vascular congestion and patchy changes likely related to CHF. Electronically Signed   By: Alcide Clever M.D.   On: 11/11/2015 21:50   Dg Shoulder Left  11/07/2015  CLINICAL DATA:  81 year old male with acute left shoulder pain following fall today. EXAM: LEFT SHOULDER - 2+ VIEW COMPARISON:  None. FINDINGS: There is no evidence of acute fracture, subluxation or dislocation. Degenerative changes at the glenohumeral joint noted. Visualized bony left hemi thorax is unremarkable. IMPRESSION: No acute bony abnormality. Electronically Signed   By: Harmon Pier M.D.   On: 11/07/2015 19:43   Dg Abd Portable 1v  11/15/2015  CLINICAL DATA:  OG tube placement EXAM: PORTABLE ABDOMEN - 1 VIEW COMPARISON:  11/14/2015 FINDINGS: The enteric tube tip is in the body of the stomach. Side port is below GE junction. The bowel gas pattern is unremarkable. IMPRESSION: 1. Tip of the enteric tube is in the stomach with side port below GE junction. Electronically Signed   By: Signa Kell M.D.   On: 11/15/2015 17:03   Dg Abd Portable 1v  11/14/2015  CLINICAL DATA:  Abdominal distension EXAM: PORTABLE ABDOMEN - 1 VIEW COMPARISON:  None. FINDINGS: Scattered large and small bowel gas is noted. No obstructive changes are seen. Postoperative changes are noted in the lumbar spine and right hip. No abnormal mass or abnormal calcifications are  seen. Multilevel degenerative changes in the lumbar spine are noted. No free air is seen. IMPRESSION: No acute abnormality noted. Electronically Signed   By: Alcide Clever M.D.   On: 11/14/2015 12:05    ASSESSMENT AND PLAN Non-STEMI. Status post remote CABG. Cardiac cath 11/13/15 -->medical therapy. DAPT.  CAP (community acquired pneumonia) with acute respiratory failure Active Problems:  Essential hypertension  Diabetes mellitus with neurological manifestation (HCC)  Acute diastolic heart failure with ejection fraction 50-55% by echo  Acute on CKD (chronic kidney disease), stage III secondary to DMII Acute delirium  He is now s/p intubation for severe PNA. Volume status looks good, if not low. Weight stable. Creatinine worse likely due to overdiuresis. Would hold lasix. Can hydrate gently as needed. Follow BMET. Continue Plavix and ASA for demand NSTEMI. We will follow from  a distance. Call with questions.   Cobie Leidner,MD 11:18 AM      Signed, Arvilla MeresBensimhon, Iriel Nason MD

## 2015-11-17 ENCOUNTER — Inpatient Hospital Stay (HOSPITAL_COMMUNITY): Payer: Medicare Other

## 2015-11-17 LAB — CBC
HCT: 33.8 % — ABNORMAL LOW (ref 39.0–52.0)
HEMOGLOBIN: 11.2 g/dL — AB (ref 13.0–17.0)
MCH: 32.6 pg (ref 26.0–34.0)
MCHC: 33.1 g/dL (ref 30.0–36.0)
MCV: 98.3 fL (ref 78.0–100.0)
PLATELETS: 169 10*3/uL (ref 150–400)
RBC: 3.44 MIL/uL — AB (ref 4.22–5.81)
RDW: 14 % (ref 11.5–15.5)
WBC: 7 10*3/uL (ref 4.0–10.5)

## 2015-11-17 LAB — GLUCOSE, CAPILLARY
GLUCOSE-CAPILLARY: 122 mg/dL — AB (ref 65–99)
GLUCOSE-CAPILLARY: 117 mg/dL — AB (ref 65–99)
GLUCOSE-CAPILLARY: 217 mg/dL — AB (ref 65–99)
Glucose-Capillary: 113 mg/dL — ABNORMAL HIGH (ref 65–99)
Glucose-Capillary: 118 mg/dL — ABNORMAL HIGH (ref 65–99)

## 2015-11-17 LAB — BLOOD GAS, ARTERIAL
Acid-Base Excess: 1.6 mmol/L (ref 0.0–2.0)
BICARBONATE: 25.3 meq/L — AB (ref 20.0–24.0)
Drawn by: 419773
FIO2: 0.4
O2 Saturation: 95.3 %
PATIENT TEMPERATURE: 98.6
PCO2 ART: 37.4 mmHg (ref 35.0–45.0)
PEEP: 5 cmH2O
RATE: 15 resp/min
TCO2: 26.5 mmol/L (ref 0–100)
VT: 560 mL
pH, Arterial: 7.446 (ref 7.350–7.450)
pO2, Arterial: 79.2 mmHg — ABNORMAL LOW (ref 80.0–100.0)

## 2015-11-17 LAB — PHOSPHORUS: Phosphorus: 3.5 mg/dL (ref 2.5–4.6)

## 2015-11-17 LAB — MAGNESIUM: MAGNESIUM: 1.9 mg/dL (ref 1.7–2.4)

## 2015-11-17 LAB — BASIC METABOLIC PANEL
ANION GAP: 10 (ref 5–15)
BUN: 66 mg/dL — ABNORMAL HIGH (ref 6–20)
CO2: 27 mmol/L (ref 22–32)
Calcium: 7.8 mg/dL — ABNORMAL LOW (ref 8.9–10.3)
Chloride: 103 mmol/L (ref 101–111)
Creatinine, Ser: 2.42 mg/dL — ABNORMAL HIGH (ref 0.61–1.24)
GFR calc Af Amer: 26 mL/min — ABNORMAL LOW (ref >60)
GFR, EST NON AFRICAN AMERICAN: 23 mL/min — AB (ref >60)
Glucose, Bld: 150 mg/dL — ABNORMAL HIGH (ref 65–99)
POTASSIUM: 3.9 mmol/L (ref 3.5–5.1)
SODIUM: 140 mmol/L (ref 135–145)

## 2015-11-17 LAB — LACTIC ACID, PLASMA: LACTIC ACID, VENOUS: 1.1 mmol/L (ref 0.5–2.0)

## 2015-11-17 MED ORDER — VITAL AF 1.2 CAL PO LIQD
1000.0000 mL | ORAL | Status: DC
  Filled 2015-11-17 (×5): qty 1000

## 2015-11-17 MED ORDER — VITAL HIGH PROTEIN PO LIQD: 1000.0000 mL | ORAL | Status: DC

## 2015-11-17 MED ORDER — CHLORHEXIDINE GLUCONATE 0.12 % MT SOLN
15.0000 mL | Freq: Two times a day (BID) | OROMUCOSAL | Status: DC
  Administered 2015-11-17 – 2015-11-24 (×12): 15 mL via OROMUCOSAL
  Filled 2015-11-17 (×13): qty 15

## 2015-11-17 MED ORDER — FENTANYL CITRATE (PF) 100 MCG/2ML IJ SOLN: 12.5000 ug | INTRAMUSCULAR | Status: DC | PRN

## 2015-11-17 MED ORDER — CETYLPYRIDINIUM CHLORIDE 0.05 % MT LIQD
7.0000 mL | Freq: Two times a day (BID) | OROMUCOSAL | Status: DC
  Administered 2015-11-17 – 2015-11-24 (×12): 7 mL via OROMUCOSAL

## 2015-11-17 NOTE — Progress Notes (Signed)
Initial Nutrition Assessment  DOCUMENTATION CODES:   Not applicable  INTERVENTION:    Initiate Vital 1.2 formula at 20 ml/hr and increase by 10 ml every 4 hours to goal rate of 60 ml/hr  TF regimen provides 2016 kcals, 126 gm protein, 1362 ml of free water  NUTRITION DIAGNOSIS:   Inadequate oral intake related to inability to eat as evidenced by NPO status  GOAL:   Patient will meet greater than or equal to 90% of their needs  MONITOR:   TF tolerance, Vent status, Labs, Weight trends, I & O's  REASON FOR ASSESSMENT:   Consult Enteral/tube feeding initiation and management  ASSESSMENT:   81 yo remote smoker with diffuse CAD not amenable to invasive intervention , +NSEMI 12/21, left pna with worsening resp distress and intolerant of NIMVS. Suspect he will need elective intubation to prevent emergent intubation later on. Note he has been diuresed and may become hypotensive post intubation. PCCM will assume his care while he is intubated. He is a full code.  Patient is currently intubated on ventilator support -- OGT in place MV: 8.1 L/min Temp (24hrs), Avg:99.6 F (37.6 C), Min:97.7 F (36.5 C), Max:102.2 F (39 C)   Nutrition focused physical exam completed.  No muscle or subcutaneous fat depletion noticed.  Diet Order:  Diet NPO time specified  Skin:   Reviewed, no issues  Last BM:  12/24  Height:   Ht Readings from Last 1 Encounters:  11/11/15 5\' 9"  (1.753 m)    Weight:   Wt Readings from Last 1 Encounters:  11/17/15 174 lb (78.926 kg)    Ideal Body Weight:  73 kg  BMI:  Body mass index is 25.68 kg/(m^2).  Estimated Nutritional Needs:   Kcal:  1953  Protein:  120-130 gm  Fluid:  per MD  EDUCATION NEEDS:   No education needs identified at this time  Maureen ChattersKatie Garen Woolbright, RD, LDN Pager #: [phone] After-Hours Pager #: [phone]

## 2015-11-17 NOTE — Progress Notes (Signed)
PULMONARY / CRITICAL CARE MEDICINE   Name: [name] MRN: [id number] DOB: [birth year]    ADMISSION DATE:  11/11/2015 CONSULTATION DATE:  12/24   REFERRING MD:  triad   CHIEF COMPLAINT:  Chest pain/increased wob  Brief:   81 y/o male with CAD, admitted on 12/21 with NSTEMI, had a cath 12/22, no interventions made.  Developed HCAP, intubated 12/24.    SUBJECTIVE:  Weaned all day yesterday Tolerating pressure support this morning  VITAL SIGNS: BP 104/43 mmHg  Pulse 54  Temp(Src) 99.1 F (37.3 C) (Oral)  Resp 22  Ht  (1.753 m)  Wt 78.926 kg (174 lb)  BMI 25.68 kg/m2  SpO2 94%  HEMODYNAMICS:    VENTILATOR SETTINGS: Vent Mode:  [-] PSV;CPAP FiO2 (%):  [40 %] 40 % Set Rate:  [15 bmp] 15 bmp Vt Set:  [560 mL] 560 mL PEEP:  [5 cmH20] 5 cmH20 Pressure Support:  [5 cmH20] 5 cmH20 Plateau Pressure:  [19 cmH20-21 cmH20] 21 cmH20  INTAKE / OUTPUT: I/O last 3 completed shifts: In: 2988.3 [I.V.:2388.3; NG/GT:150; IV Piggyback:450] Out: 1545 [Urine:1515; Emesis/NG output:30]  PHYSICAL EXAMINATION: General: no distress on vent Neuro: awake, following commands HEENT: NCAT ETT in place Cardiovascular: RRR, normal S1/S2 Lungs: crackles left lung, otherwise clear, vent supported breaths Abdomen:  Soft, nontender, non distended. Musculoskeletal:  No edema, warm Skin:  Acyanotic, no rash  LABS:  BMET  Recent Labs Lab 11/15/15 0511 11/16/15 0500 11/17/15 0408  NA 139 137 140  K 3.5 3.7 3.9  CL 100* 98* 103  CO2 BUN 42* 57* 66*  CREATININE 1.61* 2.60* 2.42*  GLUCOSE 142* 195* 150*   Electrolytes  Recent Labs Lab 11/15/15 0511 11/16/15 0500 11/17/15 0408  CALCIUM 8.1* 8.0* 7.8*  MG 1.8  --  1.9  PHOS  --   --  3.5   CBC  Recent Labs Lab 11/15/15 0511 11/16/15 0500 11/17/15 0408  WBC 7.8 7.1 7.0  HGB 12.1* 12.2* 11.2*  HCT 35.5* 35.3* 33.8*  PLT 167 168 169   Coag's  Recent Labs Lab 11/11/15 2145 11/13/15 0226  APTT 30   --   INR 1.15 1.32   Sepsis Markers  Recent Labs Lab 11/11/15 2200  11/13/15 0824 11/13/15 0942 11/13/15 1140 11/14/15 0346 11/16/15 0500  LATICACIDVEN 1.47  --  1.1  --  1.1  --   --   PROCALCITON  --   < >  --  <0.10  --  0.10 0.25  < > = values in this interval not displayed.  ABG  Recent Labs Lab 11/13/15 0854 11/15/15 1454 11/17/15 0325  PHART 7.504* 7.485* 7.446  PCO2ART 34.8* 37.0 37.4  PO2ART 44.0* 60.0* 79.2*   Liver Enzymes  Recent Labs Lab 11/11/15 2145  AST 27  ALT 14*  ALKPHOS 42  BILITOT 0.7  ALBUMIN 4.0   Cardiac Enzymes  Recent Labs Lab 11/12/15 0335 11/12/15 0819 11/12/15 1441  TROPONINI 18.18* 25.52* 25.58*   Glucose  Recent Labs Lab 11/16/15 0733 11/16/15 1116 11/16/15 1557 11/16/15 1951 11/17/15 0037 11/17/15 0747  GLUCAP 149* 147* 122* 117* 113* 122*   Imaging  12/26 CXR > left lung infiltrate, ETT in place  STUDIES:    CULTURES: 12/24 sputum>> poor specimen 12/24 bc x2>>  ANTIBIOTICS: 12/22 fortaz>> 12/22 vanc>> 12/26  SIGNIFICANT EVENTS: 12/21 NSTEMI 12/22 CC Diffuse dx 12/24 intubated  LINES/TUBES: 12/24 ETT>>  DISCUSSION: Frail 81 yo remote smoker with diffuse CAD not amenable to  invasive intervention , +NSTEMI 12/21, 12/22 LHC. no targets, diffuse disease, rec med managemnt.  Then intubated 12/24 due to HCAP.  Better today, ready for extubation.  Still on low dose dopamine.  ASSESSMENT / PLAN:  PULMONARY A: HCAP Acute respiratory failure with hypoemia P:   Extubate today NPO for four hours post extubation Monitor O2 sats, keep above 92%  CARDIOVASCULAR A:  Recent NSTEMI Diffuse dx by cc 12/22, rec med management Cards following Hypotension 12/26> sedation related? P:  Hold antihypertensives for now, restart 12/27 Wean off dopamine Plavix   RENAL  A:   Deteriorating renal function > due to over diuresis, stable 12/26 with good UOP P:   Monitor BMET and UOP Replace electrolytes  as needed   GASTROINTESTINAL A:   GI protection P:   PPI TF per nutrition > hold today Advance diet 12/26 if able  HEMATOLOGIC A:   No acute issues P:  CBC in AM Transfuse per ICU protocol  INFECTIOUS A:   Left lung HCAP P:   Elita QuickFortaz 12/22 > continue Stop vanc today   ENDOCRINE A:   DM  P:   SSI  NEUROLOGIC A:   No acute issues P:   Stop sedation now Prn APAP/fentanyl for pain  FAMILY  - Updates: Wife updated at bedside 12/26  - Inter-disciplinary family meet or Palliative Care meeting due by:  day 7  My cc time 40 minutes  Heber CarolinaBrent McQuaid, MD [place] [facility] Pager: [phone] Cell: [phone] After 3pm or if no response, call [phone]   11/17/2015, 11:23 AM

## 2015-11-17 NOTE — Procedures (Signed)
Extubation Procedure Note  Patient Details:   Name: Dylan BennettRichard J Barron DOB: 1927/11/24 MRN: [id number]   Airway Documentation:  Airway 8 mm (Active)  Secured at (cm) 24 cm 11/17/2015  8:36 AM  Measured From Lips 11/17/2015  8:36 AM  Secured Location Center 11/17/2015  8:36 AM  Secured By [employer] Tube Holder 11/17/2015  8:36 AM  Tube Holder Repositioned Yes 11/17/2015  8:36 AM  Cuff Pressure (cm H2O) 26 cm H2O 11/16/2015  7:21 PM  Site Condition Dry 11/17/2015  8:36 AM    Evaluation  O2 sats: stable throughout Complications: No apparent complications Patient did tolerate procedure well. Bilateral Breath Sounds: Clear, Diminished Suctioning: Airway Yes   Positive cuff leak noted prior to extubation. Pt placed on [place] 4 Lpm with humidity. Sat 93% Rt instructed patient on IS. Patient was able to do 900 mls using the IS consistently. RT will continue to monitor as needed.  Rayburn FeltJean S Tracie Lindbloom 11/17/2015, 12:32 PM

## 2015-11-17 NOTE — Progress Notes (Signed)
eLink Physician-Brief Progress Note Patient Name: Dylan BennettRichard J Barron DOB: 02/14/1928 MRN: [id number]   Date of Service  11/17/2015  HPI/Events of Note  Remains on dopamine but doing well, UOP adequate   eICU Interventions  Change map goal for dopamine to > 55 Repeat lactic acid     Intervention Category Major Interventions: Shock - evaluation and management  Max FickleDouglas Sie Formisano 11/17/2015, 7:33 PM

## 2015-11-18 DIAGNOSIS — Z794 Long term (current) use of insulin: Secondary | ICD-10-CM

## 2015-11-18 DIAGNOSIS — E1140 Type 2 diabetes mellitus with diabetic neuropathy, unspecified: Secondary | ICD-10-CM

## 2015-11-18 LAB — BASIC METABOLIC PANEL
Anion gap: 8 (ref 5–15)
BUN: 65 mg/dL — AB (ref 6–20)
CALCIUM: 7.8 mg/dL — AB (ref 8.9–10.3)
CO2: 25 mmol/L (ref 22–32)
CREATININE: 1.88 mg/dL — AB (ref 0.61–1.24)
Chloride: 106 mmol/L (ref 101–111)
GFR calc Af Amer: 36 mL/min — ABNORMAL LOW (ref >60)
GFR, EST NON AFRICAN AMERICAN: 31 mL/min — AB (ref >60)
Glucose, Bld: 185 mg/dL — ABNORMAL HIGH (ref 65–99)
Potassium: 4.1 mmol/L (ref 3.5–5.1)
SODIUM: 139 mmol/L (ref 135–145)

## 2015-11-18 LAB — CULTURE, RESPIRATORY: SPECIAL REQUESTS: NORMAL

## 2015-11-18 LAB — GLUCOSE, CAPILLARY
GLUCOSE-CAPILLARY: 242 mg/dL — AB (ref 65–99)
Glucose-Capillary: 200 mg/dL — ABNORMAL HIGH (ref 65–99)
Glucose-Capillary: 150 mg/dL — ABNORMAL HIGH (ref 65–99)
Glucose-Capillary: 117 mg/dL — ABNORMAL HIGH (ref 65–99)
Glucose-Capillary: 239 mg/dL — ABNORMAL HIGH (ref 65–99)

## 2015-11-18 LAB — CBC WITH DIFFERENTIAL/PLATELET
BASOS PCT: 0 %
Basophils Absolute: 0 10*3/uL (ref 0.0–0.1)
EOS ABS: 0.1 10*3/uL (ref 0.0–0.7)
EOS PCT: 1 %
HCT: 30 % — ABNORMAL LOW (ref 39.0–52.0)
Hemoglobin: 10.2 g/dL — ABNORMAL LOW (ref 13.0–17.0)
LYMPHS ABS: 1.2 10*3/uL (ref 0.7–4.0)
Lymphocytes Relative: 18 %
MCH: 33.6 pg (ref 26.0–34.0)
MCHC: 34 g/dL (ref 30.0–36.0)
MCV: 98.7 fL (ref 78.0–100.0)
MONO ABS: 0.6 10*3/uL (ref 0.1–1.0)
Monocytes Relative: 9 %
Neutro Abs: 4.8 10*3/uL (ref 1.7–7.7)
Neutrophils Relative %: 72 %
PLATELETS: 164 10*3/uL (ref 150–400)
RBC: 3.04 MIL/uL — AB (ref 4.22–5.81)
RDW: 14.1 % (ref 11.5–15.5)
WBC: 6.7 10*3/uL (ref 4.0–10.5)

## 2015-11-18 LAB — CULTURE, RESPIRATORY W GRAM STAIN

## 2015-11-18 MED ORDER — PANTOPRAZOLE SODIUM 40 MG PO TBEC
40.0000 mg | DELAYED_RELEASE_TABLET | Freq: Every day | ORAL | Status: DC
  Administered 2015-11-18 – 2015-11-24 (×7): 40 mg via ORAL
  Filled 2015-11-18 (×7): qty 1

## 2015-11-18 MED ORDER — TRAMADOL HCL 50 MG PO TABS
50.0000 mg | ORAL_TABLET | Freq: Four times a day (QID) | ORAL | Status: DC | PRN
  Administered 2015-11-22 (×2): 50 mg via ORAL
  Filled 2015-11-18 (×2): qty 1

## 2015-11-18 MED ORDER — ENOXAPARIN SODIUM 40 MG/0.4ML ~~LOC~~ SOLN
40.0000 mg | SUBCUTANEOUS | Status: DC
  Administered 2015-11-19 – 2015-11-24 (×6): 40 mg via SUBCUTANEOUS
  Filled 2015-11-18 (×6): qty 0.4

## 2015-11-18 MED ORDER — DEXTROSE 5 % IV SOLN
1.0000 g | Freq: Two times a day (BID) | INTRAVENOUS | Status: AC
  Administered 2015-11-18 – 2015-11-19 (×4): 1 g via INTRAVENOUS
  Filled 2015-11-18 (×5): qty 1

## 2015-11-18 NOTE — Progress Notes (Signed)
Rehab Admissions Coordinator Note:  Patient was screened by Trish MageLogue, [place] M for appropriateness for an Inpatient Acute Rehab Consult.  At this time, we are recommending Skilled Nursing Facility. [facility] and given current diagnosis, it is highly unlikely that we could get authorization for an acute inpatient rehab stay.  Therefore, recommend SNF.  Trish MageLogue, Wilmon Conover M 11/18/2015, 2:54 PM  I can be reached at [phone].

## 2015-11-18 NOTE — Evaluation (Signed)
Physical Therapy Evaluation Patient Details Name: Dylan BennettRichard J Chaudhuri MRN: [id number] DOB: [birth year] Today's Date: 11/18/2015   History of Present Illness  81 y/o male with CAD, admitted on 12/21 with NSTEMI, had a cath 12/22, no interventions made. Developed HCAP, intubated 12/24.   Clinical Impression  Pt admitted with above diagnosis. Pt currently with functional limitations due to the deficits listed below (see PT Problem List). Pt was able to stand with mod assist but could not take steps.  Flexed posture as well. Will need Rehab prior to going home with wife. Will follow acutely.   Pt will benefit from skilled PT to increase their independence and safety with mobility to allow discharge to the venue listed below.    Follow Up Recommendations CIR;Supervision/Assistance - 24 hour    Equipment Recommendations  Other (comment) (TBA)    Recommendations for Other Services Rehab consult     Precautions / Restrictions Precautions Precautions: Fall Restrictions Weight Bearing Restrictions: No      Mobility  Bed Mobility               General bed mobility comments: pt in chair on arrival  Transfers Overall transfer level: Needs assistance Equipment used: Rolling walker (2 wheeled) Transfers: Sit to/from Stand Sit to Stand: Mod assist         General transfer comment: Pt required mod assist to stand from chair with cues for hand placement.  Pt unable to stand fully upright with posture flexed over with pt unable to push up on UEs to stand upright.  Pt stood for up to a minute flexed over which continued to worsen to where the pt was flexed toa 90 degree position.  Pt was unable to pick feet up even though he tried.  Pt was exhausted after one stand to RW.   Ambulation/Gait                Stairs            Wheelchair Mobility    Modified Rankin (Stroke Patients Only)       Balance Overall balance assessment: Needs assistance;History of Falls          Standing balance support: Bilateral upper extremity supported;During functional activity Standing balance-Leahy Scale: Zero Standing balance comment: Pt stood with RW with mod assist but could not stand fully upright with trunk flexed to 90 degrees.                               Pertinent Vitals/Pain Pain Assessment: Faces Faces Pain Scale: Hurts even more Pain Location: back pain Pain Descriptors / Indicators: Aching;Sore Pain Intervention(s): Limited activity within patient's tolerance;Monitored during session;Repositioned  Desat to 86% on 4LO2 with activity.  90% and greater at rest on 4LO2.  Other VSS.     Home Living Family/patient expects to be discharged to:: Private residence Living Arrangements: Spouse/significant other Available Help at Discharge: Family;Available 24 hours/day (wife can't provide the physical assist pt will need) Type of Home: House Home Access: Stairs to enter Entrance Stairs-Rails: Right;Left;Can reach both Entrance Stairs-Number of Steps: 4 Home Layout: One level Home Equipment: Walker - 4 wheels;Bedside commode;Tub bench;Grab bars - tub/shower (3LO2 at home)      Prior Function Level of Independence: Independent with assistive device(s);Needs assistance   Gait / Transfers Assistance Needed: pt states he used 4 wheeled RW at home with Modif I  ADL's / Homemaking Assistance Needed: wife assisted  him some per pt        Hand Dominance        Extremity/Trunk Assessment   Upper Extremity Assessment: Defer to OT evaluation           Lower Extremity Assessment: RLE deficits/detail;LLE deficits/detail RLE Deficits / Details: grossly 3-/5 LLE Deficits / Details: grossly 3-/5  Cervical / Trunk Assessment: Kyphotic  Communication   Communication: No difficulties  Cognition Arousal/Alertness: Awake/alert Behavior During Therapy: Flat affect Overall Cognitive Status: Within Functional Limits for tasks assessed                       General Comments      Exercises General Exercises - Lower Extremity Ankle Circles/Pumps: AROM;Both;10 reps;Seated Long Arc Quad: AROM;Both;10 reps;Seated Hip Flexion/Marching: AROM;Both;10 reps;Seated      Assessment/Plan    PT Assessment Patient needs continued PT services  PT Diagnosis Generalized weakness;Acute pain   PT Problem List Decreased activity tolerance;Decreased balance;Decreased strength;Decreased mobility;Decreased knowledge of use of DME;Decreased safety awareness;Decreased knowledge of precautions;Pain  PT Treatment Interventions DME instruction;Gait training;Functional mobility training;Therapeutic activities;Therapeutic exercise;Balance training;Neuromuscular re-education;Patient/family education   PT Goals (Current goals can be found in the Care Plan section) Acute Rehab PT Goals Patient Stated Goal: to get better PT Goal Formulation: With patient Time For Goal Achievement: 12/02/15 Potential to Achieve Goals: Good    Frequency Min 3X/week   Barriers to discharge Decreased caregiver support      Co-evaluation               End of Session Equipment Utilized During Treatment: Gait belt;Oxygen Activity Tolerance: Patient limited by fatigue Patient left: in chair;with call bell/phone within reach;with chair alarm set Nurse Communication: Mobility status         Time: 8295-6213 PT Time Calculation (min) (ACUTE ONLY): 18 min   Charges:   PT Evaluation $Initial PT Evaluation Tier I: 1 Procedure     PT G CodesBerline Lopes 16-Dec-2015, 12:18 PM  [facility] Pain[facility] Acute Rehabilitation [phone] (pager)

## 2015-11-18 NOTE — Progress Notes (Signed)
Wasted fentanyl in sink. Approx 150 ml, witnessed with Daine Florasyan Gundrum, RN.

## 2015-11-18 NOTE — Care Management Note (Signed)
Case Management Note  Patient Details  Name: Dylan BennettRichard J Barron MRN: [id number] Date of Birth: 10-15-1928  Subjective/Objective:         Hypoxia with sepsis and chf vs pnc           Action/Plan: 11/18/2015 Pt transferred from ICU today 11/18/15.  Recommendation is for CIR post discharge, CIR assessed pt and determined pt be be a better fit for SNF.  CM ordered  CSW consult  Date: November 12, 2015 Chart reviewed for concurrent status and case management needs. Will continue to follow patient for changes and needs: Marcelle Smilinghonda [name], RN, BSN, [place]   [phone]  Expected Discharge Date:   (unknown)               Expected Discharge Plan:  Home w Home Health Services  In-House Referral:  NA  Discharge planning Services  CM Consult  Post Acute Care Choice:  Home Health, Durable Medical Equipment Choice offered to:  Spouse  DME Arranged:    DME Agency:     HH Arranged:    HH Agency:     Status of Service:  In process, will continue to follow  Medicare Important Message Given:    Date Medicare IM Given:    Medicare IM give by:    Date Additional Medicare IM Given:    Additional Medicare Important Message give by:     If discussed at Long Length of Stay Meetings, dates discussed:    Additional Comments:  [name], [name], RN 11/18/2015, 3:01 PM

## 2015-11-18 NOTE — Progress Notes (Signed)
Bedside swallowing per eval. Per nurse completed. Patient tolerated eval. Diet advanced per MD order. Will continue to monitor

## 2015-11-18 NOTE — Progress Notes (Signed)
PULMONARY / CRITICAL CARE MEDICINE   Name: Dylan BennettRichard J Barron MRN: [id number] DOB: 08/03/1928    ADMISSION DATE:  11/11/2015 CONSULTATION DATE:  12/24   REFERRING MD:  [facility]   CHIEF COMPLAINT:  Chest pain/increased wob  Brief:   81 y/o male with CAD, admitted on 12/21 with NSTEMI, had a cath 12/22, no interventions made.  Developed HCAP, intubated 12/24.    SUBJECTIVE:  Extubated  Yesterday Off pressors Out of bed this mornign Eating a little  VITAL SIGNS: BP 142/51 mmHg  Pulse 62  Temp(Src) 97.7 F (36.5 C) (Oral)  Resp 24  Ht 5\' 9"  (1.753 m)  Wt 88.905 kg (196 lb)  BMI 28.93 kg/m2  SpO2 99%  HEMODYNAMICS:    VENTILATOR SETTINGS:    INTAKE / OUTPUT: I/O last 3 completed shifts: In: 3273.8 [I.V.:3023.8; IV Piggyback:250] Out: 1860 [Urine:1860]  PHYSICAL EXAMINATION: General: no distress in chair Neuro: awake, conversant HEENT: NCAT  Cardiovascular: RRR, normal S1/S2 Lungs: crackles left lung, some wheezing Abdomen:  Soft, nontender, non distended. Musculoskeletal:  No edema, warm Skin:  Acyanotic, no rash  LABS:  BMET  Recent Labs Lab 11/16/15 0500 11/17/15 0408 11/18/15 0239  NA 137 140 139  K 3.7 3.9 4.1  CL 98* 103 106  CO2 29 27 25   BUN 57* 66* 65*  CREATININE 2.60* 2.42* 1.88*  GLUCOSE 195* 150* 185*   Electrolytes  Recent Labs Lab 11/15/15 0511 11/16/15 0500 11/17/15 0408 11/18/15 0239  CALCIUM 8.1* 8.0* 7.8* 7.8*  MG 1.8  --  1.9  --   PHOS  --   --  3.5  --    CBC  Recent Labs Lab 11/16/15 0500 11/17/15 0408 11/18/15 0239  WBC 7.1 7.0 6.7  HGB 12.2* 11.2* 10.2*  HCT 35.3* 33.8* 30.0*  PLT 168 169 164   Coag's  Recent Labs Lab 11/11/15 2145 11/13/15 0226  APTT 30  --   INR 1.15 1.32   Sepsis Markers  Recent Labs Lab 11/13/15 0824 11/13/15 0942 11/13/15 1140 11/14/15 0346 11/16/15 0500 11/17/15 2045  LATICACIDVEN 1.1  --  1.1  --   --  1.1  PROCALCITON  --  <0.10  --  0.10 0.25  --      ABG  Recent Labs Lab 11/13/15 0854 11/15/15 1454 11/17/15 0325  PHART 7.504* 7.485* 7.446  PCO2ART 34.8* 37.0 37.4  PO2ART 44.0* 60.0* 79.2*   Liver Enzymes  Recent Labs Lab 11/11/15 2145  AST 27  ALT 14*  ALKPHOS 42  BILITOT 0.7  ALBUMIN 4.0   Cardiac Enzymes  Recent Labs Lab 11/12/15 0335 11/12/15 0819 11/12/15 1441  TROPONINI 18.18* 25.52* 25.58*   Glucose  Recent Labs Lab 11/17/15 1122 11/17/15 1552 11/17/15 2106 11/17/15 2353 11/18/15 0329 11/18/15 0807  GLUCAP 118* 117* 217* 200* 150* 117*   Imaging  12/26 CXR > left lung infiltrate, ETT in place  STUDIES:    CULTURES: 12/24 sputum>> poor specimen 12/24 bc x2>>  ANTIBIOTICS: 12/22 fortaz>> 12/22 vanc>> 12/26  SIGNIFICANT EVENTS: 12/21 NSTEMI 12/22 CC Diffuse dx 12/24 intubated  LINES/TUBES: 12/24 ETT>>  DISCUSSION: Frail 81 yo remote smoker with diffuse CAD not amenable to invasive intervention , +NSTEMI 12/21, 12/22 LHC. no targets, diffuse disease, rec med managemnt.  Then intubated 12/24 due to HCAP. Extubated 12/27.  Deconditioned and limited by neuropathy, will need long term rehab/PT.   ASSESSMENT / PLAN:  PULMONARY A: HCAP Acute respiratory failure with hypoxemia > resolved P:   Pulmonary toilette measures  Flutter Diet as tolerated Monitor O2 sats, keep above 92%  CARDIOVASCULAR A:  Recent NSTEMI Diffuse dx by cc 12/22, rec med management Cards following Hypotension 12/26> resolved P:  Cardiac meds per cardiology Plavix   RENAL  A:   Deteriorating renal function > improving P:   Monitor BMET and UOP Replace electrolytes as needed   GASTROINTESTINAL A:   GERD P:   PPI > change to PO Diet as tolerated   HEMATOLOGIC A:   No acute issues P:  Transfuse per ICU protocol  INFECTIOUS A:   Left lung HCAP >improving P:   Fortaz 12/22 > continue 7 day course Stop vanc today   ENDOCRINE A:   DM  P:   SSI  NEUROLOGIC A:    Deconditioning Neuropathy P:   PT following Prn APAP/fentanyl for pain  FAMILY  - Updates: Wife updated at bedside 12/26  - Inter-disciplinary family meet or Palliative Care meeting due by:  day 7  Transfer to telemetry, [facility] service, PCCM off  Heber Tuttle, MD [place] PCCM Pager: [phone] Cell: [phone] After 3pm or if no response, call [phone]   11/18/2015, 11:39 AM

## 2015-11-19 LAB — BASIC METABOLIC PANEL
ANION GAP: 9 (ref 5–15)
BUN: 54 mg/dL — ABNORMAL HIGH (ref 6–20)
CO2: 26 mmol/L (ref 22–32)
Calcium: 8.2 mg/dL — ABNORMAL LOW (ref 8.9–10.3)
Chloride: 105 mmol/L (ref 101–111)
Creatinine, Ser: 1.53 mg/dL — ABNORMAL HIGH (ref 0.61–1.24)
GFR, EST AFRICAN AMERICAN: 46 mL/min — AB (ref >60)
GFR, EST NON AFRICAN AMERICAN: 40 mL/min — AB (ref >60)
GLUCOSE: 126 mg/dL — AB (ref 65–99)
Potassium: 4.2 mmol/L (ref 3.5–5.1)
SODIUM: 140 mmol/L (ref 135–145)

## 2015-11-19 LAB — CBC
HEMATOCRIT: 29.6 % — AB (ref 39.0–52.0)
Hemoglobin: 9.8 g/dL — ABNORMAL LOW (ref 13.0–17.0)
MCH: 32.6 pg (ref 26.0–34.0)
MCHC: 33.1 g/dL (ref 30.0–36.0)
MCV: 98.3 fL (ref 78.0–100.0)
PLATELETS: 211 10*3/uL (ref 150–400)
RBC: 3.01 MIL/uL — ABNORMAL LOW (ref 4.22–5.81)
RDW: 14.1 % (ref 11.5–15.5)
WBC: 7.7 10*3/uL (ref 4.0–10.5)

## 2015-11-19 LAB — GLUCOSE, CAPILLARY
GLUCOSE-CAPILLARY: 120 mg/dL — AB (ref 65–99)
GLUCOSE-CAPILLARY: 123 mg/dL — AB (ref 65–99)
GLUCOSE-CAPILLARY: 201 mg/dL — AB (ref 65–99)
GLUCOSE-CAPILLARY: 217 mg/dL — AB (ref 65–99)
GLUCOSE-CAPILLARY: 261 mg/dL — AB (ref 65–99)
Glucose-Capillary: 214 mg/dL — ABNORMAL HIGH (ref 65–99)
Glucose-Capillary: 221 mg/dL — ABNORMAL HIGH (ref 65–99)

## 2015-11-19 MED ORDER — AMLODIPINE BESYLATE 5 MG PO TABS
5.0000 mg | ORAL_TABLET | Freq: Every day | ORAL | Status: DC
  Administered 2015-11-20 – 2015-11-24 (×5): 5 mg via ORAL
  Filled 2015-11-19 (×5): qty 1

## 2015-11-19 MED ORDER — ASPIRIN EC 81 MG PO TBEC
81.0000 mg | DELAYED_RELEASE_TABLET | Freq: Every day | ORAL | Status: DC
  Administered 2015-11-19 – 2015-11-24 (×6): 81 mg via ORAL
  Filled 2015-11-19 (×6): qty 1

## 2015-11-19 MED ORDER — INSULIN ASPART 100 UNIT/ML ~~LOC~~ SOLN
0.0000 [IU] | Freq: Three times a day (TID) | SUBCUTANEOUS | Status: DC
  Administered 2015-11-19: 3 [IU] via SUBCUTANEOUS
  Administered 2015-11-20: 2 [IU] via SUBCUTANEOUS
  Administered 2015-11-20 – 2015-11-21 (×3): 3 [IU] via SUBCUTANEOUS
  Administered 2015-11-21 (×2): 2 [IU] via SUBCUTANEOUS
  Administered 2015-11-22: 1 [IU] via SUBCUTANEOUS
  Administered 2015-11-22: 5 [IU] via SUBCUTANEOUS
  Administered 2015-11-22: 3 [IU] via SUBCUTANEOUS
  Administered 2015-11-23: 7 [IU] via SUBCUTANEOUS
  Administered 2015-11-23: 2 [IU] via SUBCUTANEOUS
  Administered 2015-11-23: 5 [IU] via SUBCUTANEOUS

## 2015-11-19 MED ORDER — ZOLPIDEM TARTRATE 5 MG PO TABS
5.0000 mg | ORAL_TABLET | Freq: Every evening | ORAL | Status: DC | PRN
  Administered 2015-11-19: 5 mg via ORAL
  Filled 2015-11-19: qty 1

## 2015-11-19 MED ORDER — ATENOLOL 25 MG PO TABS
50.0000 mg | ORAL_TABLET | Freq: Every day | ORAL | Status: DC
  Administered 2015-11-20 – 2015-11-24 (×5): 50 mg via ORAL
  Filled 2015-11-19 (×6): qty 2

## 2015-11-19 MED ORDER — HYDROCOD POLST-CPM POLST ER 10-8 MG/5ML PO SUER
5.0000 mL | Freq: Once | ORAL | Status: AC
  Administered 2015-11-19: 5 mL via ORAL
  Filled 2015-11-19: qty 5

## 2015-11-19 NOTE — Progress Notes (Signed)
CSW received referral for SNF placement- pt agreeable- bed search initiated- [facility]  CSW will continue to follow  Merlyn LotJenna Holoman, [facility]SWA Clinical Social Worker [phone]

## 2015-11-19 NOTE — Progress Notes (Signed)
[place] TEAM 1 - Stepdown/ICU TEAM PROGRESS NOTE  Dylan BennettRichard J Barron ZOX:[id number] DOB: 1928-05-27 DOA: 11/11/2015 PCP: Gwen PoundsUSSO,JOHN M, MD  Admit HPI / Brief Narrative: 81 y.o. male with a past medical history significant for CHF, CAD s/p CABG, HTN, and IDDM who presented with fever, confusion, and cough.  The patient had increasing dyspnea on exertion and breathing difficulty for a month. He went to his PCP who prescribed home oxygen 2 L by nasal cannula. The patient then developed a fever and seemed more confused than usual, so his wife brought him to the ER.  In the ED, the patient had a low-grade fever to 100 F, tachycardia, tachypnea, hypertension, and hypoxia requiring 4 L of supplemental oxygen. His renal function was normal but he had an elevated BUN to creatinine ratio. The lactic acid level was normal. He had an high normal WBC. His troponin was 2.2 ng per mL, and BNP was moderately elevated. Chest x-ray showed pulmonary edema. An ECG showed no ST elevations.  Since admit: Admitted on 12/21 with NSTEMI, had a cath 12/22, no interventions made. Developed HCAP, intubated 12/24.  Extubated 12/26.  Significant Events: 12/21 NSTEMI 12/22 Cardiac cath - diffuse dx 12/24 intubated  HPI/Subjective: Pt c/o difficulty sleeping.  He denies cp, n/v, or abom pain.  He admits to intermittent sob.    Assessment/Plan:  L lung HCAP - Acute respiratory failure with hypoxemia  To complete a 7 day course of abx tx today - WBC has normalized - afebrile - still requiring 4L [place] oxygen support - attempt to wean O2 - needs eval to determine if home O2 will be required   NSTEMI - known hx of CAD s/p CABG Diffuse dx by cath 12/22 (Only LIMA patent. SVGs occluded) - Cards rec med management w/ DAPT   CKD stage III Baseline crt appears to be ~1.2 - improving - recheck in AM   Hypotension Resolved  Hx HTN BP currently reasonably controlled but there is room for improvement - increase norvasc -  follow trend   DM CBG variable - follow w/o change today   GERD  Code Status: FULL Family Communication: no family present at time of exam Disposition Plan: wean O2 as able - complete abx tx - possible d/c to SNF for rehab 12/29  Consultants: PCCM [facility] Cardiology   Antibiotics: 12/22 fortaz > 12/28 12/22 vanc > 12/26  DVT prophylaxis: lovenox  Objective: Blood pressure 138/44, pulse 63, temperature 98.4 F (36.9 C), temperature source Oral, resp. rate 20, height 5\' 9"  (1.753 m), weight 82.192 kg (181 lb 3.2 oz), SpO2 93 %.  Intake/Output Summary (Last 24 hours) at 11/19/15 1319 Last data filed at 11/19/15 1114  Gross per 24 hour  Intake    486 ml  Output    550 ml  Net    -64 ml   Exam: General: No acute respiratory distress at rest  Lungs: mild bibasilar crackles L>R - no wheeze  Cardiovascular: Regular rate and rhythm without murmur gallop or rub normal S1 and S2 Abdomen: Nontender, nondistended, soft, bowel sounds positive, no rebound, no ascites, no appreciable mass Extremities: No significant cyanosis, clubbing, or edema bilateral lower extremities  Data Reviewed: Basic Metabolic Panel:  Recent Labs Lab 11/15/15 0511 11/16/15 0500 11/17/15 0408 11/18/15 0239 11/19/15 0356  NA 139 137 140 139 140  K 3.5 3.7 3.9 4.1 4.2  CL 100* 98* 103 106 105  CO2 30 29 27 25 26   GLUCOSE 142* 195* 150* 185*  126*  BUN 42* 57* 66* 65* 54*  CREATININE 1.61* 2.60* 2.42* 1.88* 1.53*  CALCIUM 8.1* 8.0* 7.8* 7.8* 8.2*  MG 1.8  --  1.9  --   --   PHOS  --   --  3.5  --   --     CBC:  Recent Labs Lab 11/15/15 0511 11/16/15 0500 11/17/15 0408 11/18/15 0239 11/19/15 0356  WBC 7.8 7.1 7.0 6.7 7.7  NEUTROABS 6.5  --   --  4.8  --   HGB 12.1* 12.2* 11.2* 10.2* 9.8*  HCT 35.5* 35.3* 33.8* 30.0* 29.6*  MCV 99.7 98.3 98.3 98.7 98.3  PLT 167 168 169 164 211   Coags:  Recent Labs Lab 11/13/15 0226  INR 1.32   Cardiac Enzymes:  Recent Labs Lab 11/12/15 1441    TROPONINI 25.58*    CBG:  Recent Labs Lab 11/18/15 2007 11/19/15 0025 11/19/15 0341 11/19/15 0802 11/19/15 1206  GLUCAP 242* 201* 123* 120* 217*    Recent Results (from the past 240 hour(s))  Culture, blood (routine x 2)     Status: None   Collection Time: 11/11/15  9:45 PM  Result Value Ref Range Status   Specimen Description BLOOD RIGHT ANTECUBITAL  Final   Special Requests BOTTLES DRAWN AEROBIC AND ANAEROBIC  Final   Culture   Final    NO GROWTH 5 DAYS Performed at [facility]    Report Status 11/16/2015 FINAL  Final  Urine culture     Status: None   Collection Time: 11/12/15 12:28 AM  Result Value Ref Range Status   Specimen Description URINE, RANDOM  Final   Special Requests NONE  Final   Culture   Final    NO GROWTH 1 DAY Performed at [facility]    Report Status 11/13/2015 FINAL  Final  MRSA PCR Screening     Status: None   Collection Time: 11/12/15  2:56 AM  Result Value Ref Range Status   MRSA by PCR NEGATIVE NEGATIVE Final    Comment:        The GeneXpert MRSA Assay (FDA approved for NASAL specimens only), is one component of a comprehensive MRSA colonization surveillance program. It is not intended to diagnose MRSA infection nor to guide or monitor treatment for MRSA infections.   Culture, sputum-assessment     Status: None   Collection Time: 11/14/15  1:40 AM  Result Value Ref Range Status   Specimen Description EXPECTORATED SPUTUM  Final   Special Requests NONE  Final   Sputum evaluation   Final    MICROSCOPIC FINDINGS SUGGEST THAT THIS SPECIMEN IS NOT REPRESENTATIVE OF LOWER RESPIRATORY SECRETIONS. PLEASE RECOLLECT. Gram Stain Report Called to,Read Back By and Verified With: C BENGE  11/14/15 MKELLY    Report Status 11/14/2015 FINAL  Final  Culture, respiratory (NON-Expectorated)     Status: None   Collection Time: 11/15/15  1:24 PM  Result Value Ref Range Status   Specimen Description TRACHEAL ASPIRATE  Final    Special Requests Normal  Final   Gram Stain   Final    ABUNDANT WBC FEW SQUAMOUS EPITHELIAL CELLS PRESENT FEW YEAST Performed at [facility]    Culture   Final    ABUNDANT YEAST CONSISTENT WITH CANDIDA SPECIES Performed at [facility]    Report Status 11/18/2015 FINAL  Final  Culture, blood (Routine X 2) w Reflex to ID Panel     Status: None (Preliminary result)   Collection  Time: 11/15/15  3:15 PM  Result Value Ref Range Status   Specimen Description BLOOD LEFT ANTECUBITAL  Final   Special Requests BOTTLES DRAWN AEROBIC AND ANAEROBIC 10CC  Final   Culture NO GROWTH 3 DAYS  Final   Report Status PENDING  Incomplete  Culture, blood (Routine X 2) w Reflex to ID Panel     Status: None (Preliminary result)   Collection Time: 11/15/15  3:30 PM  Result Value Ref Range Status   Specimen Description BLOOD BLOOD LEFT HAND  Final   Special Requests BOTTLES DRAWN AEROBIC AND ANAEROBIC 10CC  Final   Culture NO GROWTH 3 DAYS  Final   Report Status PENDING  Incomplete     Studies:   Recent x-ray studies have been reviewed in detail by the Attending Physician  Scheduled Meds:  Scheduled Meds: . amLODipine  2.5 mg Per NG tube Daily  . antiseptic oral rinse  7 mL Mouth Rinse q12n4p  . atenolol  50 mg Per NG tube Daily  . cefTAZidime (FORTAZ)  IV  1 g Intravenous Q12H  . chlorhexidine  15 mL Mouth Rinse BID  . clopidogrel  75 mg Oral Daily  . enoxaparin (LOVENOX) injection  40 mg Subcutaneous Q24H  . gabapentin  300 mg Oral BID  . insulin aspart  0-9 Units Subcutaneous 6 times per day  . insulin glargine  20 Units Subcutaneous QHS  . isosorbide mononitrate  30 mg Oral Daily  . pantoprazole  40 mg Oral Daily  . potassium chloride  20 mEq Per Tube BID  . ranolazine  1,000 mg Oral BID  . sodium chloride  3 mL Intravenous Q12H    Time spent on care of this patient: 35 mins   MCCLUNG,JEFFREY T , MD   [facility] Office  [phone] Pager - Text Page per  Loretha Stapler as per below:  On-Call/Text Page:      [email or web address]      password [email or web address]  If 7PM-7AM, please contact night-coverage [email or web address] Password [email or web address] 11/19/2015, 1:19 PM   LOS: 7 days

## 2015-11-19 NOTE — Care Management Important Message (Signed)
Important Message  Patient Details  Name: Dylan BennettRichard J Pung MRN: [id number] Date of Birth: 1928-07-05   Medicare Important Message Given:  Yes    Kyla BalzarineShealy, Latesa Fratto Abena 11/19/2015, 2:21 PM

## 2015-11-19 NOTE — [place] FL2 (Signed)
[place] MEDICAID FL2 LEVEL OF CARE SCREENING TOOL     IDENTIFICATION  Patient Name: [name] Birthdate: 05-01-1928 Sex: male Admission Date (Current Location): 11/11/2015  [place]   Facility and Address:  [facility] [place], [address], [place], [place] [address]      Provider Number: [id number]  Attending Physician Name and Address:  Lonia BloodJeffrey T McClung, MD  Relative Name and Phone Number:       Current Level of Care: Hospital Recommended Level of Care: Skilled Nursing Facility Prior Approval Number:    Date Approved/Denied:   PASRR Number: [id number] [id number]  Discharge Plan: SNF    Current Diagnoses: Patient Active Problem List   Diagnosis Date Noted  . Pressure ulcer 11/13/2015  . Acute on chronic diastolic CHF (congestive heart failure) (HCC)   . Acute respiratory failure with hypoxia (HCC)   . CHF (congestive heart failure) (HCC) 11/12/2015  . Elevated troponin 11/12/2015  . CKD (chronic kidney disease), stage III 11/12/2015  . Altered mental state 11/12/2015  . NSTEMI (non-ST elevated myocardial infarction) (HCC) 11/12/2015  . Acute congestive heart failure (HCC)   . Dyslipidemia 11/28/2014  . Type II or unspecified type diabetes mellitus with peripheral circulatory disorders, not stated as uncontrolled(250.70) 07/30/2014  . CAP (community acquired pneumonia) 10/30/2011  . Fracture of femoral neck, right (HCC) 10/28/2011  . Essential hypertension 10/28/2011  . Diabetes mellitus with neurological manifestation (HCC) 10/28/2011  . CAD (coronary artery disease) 08/06/2011    Orientation RESPIRATION BLADDER Height & Weight    Self, Time, Situation, Place  O2 (4L [place]) Indwelling catheter 5\' 9"  (175.3 cm) 181 lbs.  BEHAVIORAL SYMPTOMS/MOOD NEUROLOGICAL BOWEL NUTRITION STATUS      Continent Diet (clear liquid)  AMBULATORY STATUS COMMUNICATION OF NEEDS Skin   Extensive Assist Verbally Surgical wounds                       Personal Care Assistance Level of Assistance  Bathing, Dressing Bathing Assistance: Maximum assistance   Dressing Assistance: Maximum assistance     Functional Limitations Info             SPECIAL CARE FACTORS FREQUENCY  PT (By licensed PT)     PT Frequency: 5/wk              Contractures      Additional Factors Info  Code Status, Allergies, Insulin Sliding Scale Code Status Info: FULL Allergies Info: Morphine And Related, Tetracyclines & Related   Insulin Sliding Scale Info: 3/day       Current Medications (11/19/2015):  This is the current hospital active medication list Current Facility-Administered Medications  Medication Dose Route Frequency Provider Last Rate Last Dose  . acetaminophen (TYLENOL) tablet 650 mg  650 mg Oral Q4H PRN Lennette Biharihomas A Kelly, MD   650 mg at 11/16/15 1028  . [START ON 11/20/2015] amLODipine (NORVASC) tablet 5 mg  5 mg Oral Daily Lonia BloodJeffrey T McClung, MD      . antiseptic oral rinse (CPC / CETYLPYRIDINIUM CHLORIDE 0.05%) solution 7 mL  7 mL Mouth Rinse q12n4p Lupita Leashouglas B McQuaid, MD   7 mL at 11/19/15 1251  . aspirin EC tablet 81 mg  81 mg Oral Daily Lonia BloodJeffrey T McClung, MD      . Melene Muller[START ON 11/20/2015] atenolol (TENORMIN) tablet 50 mg  50 mg Oral Daily Lonia BloodJeffrey T McClung, MD      . bisacodyl (DULCOLAX) suppository 10 mg  10 mg Rectal Daily PRN Rolly Salter, MD      . cefTAZidime (FORTAZ) 1 g in dextrose 5 % 50 mL IVPB  1 g Intravenous Q12H Lonia Blood, MD   1 g at 11/19/15 1129  . chlorhexidine (PERIDEX) 0.12 % solution 15 mL  15 mL Mouth Rinse BID Lupita Leash, MD   15 mL at 11/19/15 1100  . clopidogrel (PLAVIX) tablet 75 mg  75 mg Oral Daily Cassell Clement, MD   75 mg at 11/19/15 1100  . enoxaparin (LOVENOX) injection 40 mg  40 mg Subcutaneous Q24H Alyson Reedy, MD   40 mg at 11/19/15 0843  . fentaNYL (SUBLIMAZE) injection 12.5-25 mcg  12.5-25 mcg Intravenous Q2H PRN Lupita Leash, MD      . gabapentin  (NEURONTIN) capsule 300 mg  300 mg Oral BID Alberteen Sam, MD   300 mg at 11/19/15 1100  . insulin aspart (novoLOG) injection 0-9 Units  0-9 Units Subcutaneous TID WC Lonia Blood, MD      . insulin glargine (LANTUS) injection 20 Units  20 Units Subcutaneous QHS Alberteen Sam, MD   20 Units at 11/18/15 2231  . ipratropium-albuterol (DUONEB) 0.5-2.5 (3) MG/3ML nebulizer solution 3 mL  3 mL Nebulization Q4H PRN Rolly Salter, MD   3 mL at 11/15/15 0918  . isosorbide mononitrate (IMDUR) 24 hr tablet 30 mg  30 mg Oral Daily Vilinda Blanks Minor, NP   30 mg at 11/19/15 1100  . nitroGLYCERIN (NITROSTAT) SL tablet 0.4 mg  0.4 mg Sublingual Q5 min PRN Alberteen Sam, MD      . ondansetron [facility]) injection 4 mg  4 mg Intravenous Q6H PRN Lennette Bihari, MD      . pantoprazole (PROTONIX) EC tablet 40 mg  40 mg Oral Daily Lupita Leash, MD   40 mg at 11/19/15 1100  . ranolazine (RANEXA) 12 hr tablet 1,000 mg  1,000 mg Oral BID Vilinda Blanks Minor, NP   1,000 mg at 11/19/15 1100  . sodium chloride 0.9 % injection 3 mL  3 mL Intravenous Q12H Alberteen Sam, MD   3 mL at 11/19/15 1114  . traMADol (ULTRAM) tablet 50 mg  50 mg Oral Q6H PRN Lupita Leash, MD      . zolpidem (AMBIEN) tablet 5 mg  5 mg Oral QHS PRN Lonia Blood, MD         Discharge Medications: Please see discharge summary for a list of discharge medications.  Relevant Imaging Results:  Relevant Lab Results:   Additional Information SS#: [id number]  [name], [place]

## 2015-11-19 NOTE — Evaluation (Signed)
Clinical/Bedside Swallow Evaluation Patient Details  Name: [name] MRN: [id number] Date of Birth: 1928-06-08  Today's Date: 11/19/2015 Time: SLP Start Time (ACUTE ONLY): 4098 SLP Stop Time (ACUTE ONLY): 0912 SLP Time Calculation (min) (ACUTE ONLY): 20 min  Past Medical History:  Past Medical History  Diagnosis Date  . Hypertension   . Diabetes mellitus   . Dyslipidemia   . Coronary artery disease     status post CABG. He is also status post PTCA and stenting of the circumflex artery  . Heart murmur   . Renal calculi   . 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 Cataract   . Myocardial infarction (HCC)   . NSTEMI (non-ST elevated myocardial infarction) (HCC) 11/12/2015   Past Surgical History:  Past Surgical History  Procedure Laterality Date  . Lithotripsy    . US echocardiography  01-16-09    EF 55-60  . Cardiovascular stress test  09-26-03    EF 43%  . Cardiac catheterization    . Coronary artery bypass graft      CABG X 4  . Coronary angioplasty with stent placement    . Tonsillectomy    . Cataract extraction w/ intraocular lens implant      right  . Back surgery      x6  . Total hip arthroplasty  10/29/2011    Procedure: TOTAL HIP ARTHROPLASTY;  Surgeon: Shelda Pal;  Location: [facility] ORS;  Service: Orthopedics;  Laterality: Right;  . Joint replacement    . Eye surgery    . Spine surgery    . Cardiac catheterization N/A 11/13/2015    Procedure: Left Heart Cath and Cors/Grafts Angiography;  Surgeon: Lennette Bihari, MD;  Location: [facility] INVASIVE CV LAB;  Service: Cardiovascular;  Laterality: N/A;   HPI:  81 y.o. male with a past medical history significant for HFpEF, CAD s/p CABG, HTN, and IDDM who presents with fever, confusion, and cough. Pt intubated for 2 days and extubated 11/17/15; recent CXR on 11/17/15 indicated No change in asymmetric airspace disease, worse in the left lung compatible with pneumonia versus asymmetric  edema.  Assessment / Plan / Recommendation Clinical Impression   Pt consumed thin with varying volumes through solids without frank s/s of aspiration with a delayed cough at end of BSE only; pt had initial wheezing prior to BSE and dyspnea as well.  Pt is at a mild risk for aspiration d/t breathing/swallowing balance needed during po intake; ST to f/u x1 to complete skilled observation of meal intake to assess fatigue factor and if diet modification needs to be considered further (i.e.: Dysphagia 2); Dysphagia 3 (mechanical/soft) diet with thin liquids recommended at this time.    Aspiration Risk  Mild aspiration risk    Diet Recommendation   Dysphagia 3(Mechanical soft)/thin liquids  Medication Administration: Whole meds with liquid    Other  Recommendations Oral Care Recommendations: Oral care BID   Follow up Recommendations  None    Frequency and Duration min 1 x/week  1 week       Prognosis Prognosis for Safe Diet Advancement: Good      Swallow Study   General Date of Onset: 11/12/15 HPI: 81 y.o. male with a past medical history significant for HFpEF, CAD s/p CABG, HTN, and IDDM who presents with fever, confusion, and cough. Type of Study: Bedside Swallow Evaluation Previous Swallow Assessment: Nursing stated SS completed, but couldn't find in notes; progressed to soft diet Diet Prior to  this Study: Dysphagia 3 (soft);Thin liquids Temperature Spikes Noted: No Respiratory Status: Nasal cannula History of Recent Intubation: Yes Length of Intubations (days): 2 days Date extubated: 11/17/15 Behavior/Cognition: Alert;Cooperative Oral Cavity Assessment: Within Functional Limits Oral Care Completed by SLP: No Oral Cavity - Dentition: Adequate natural dentition Vision: Functional for self-feeding Self-Feeding Abilities: Able to feed self Patient Positioning: Upright in chair Baseline Vocal Quality: Low vocal intensity Volitional Cough: Strong Volitional Swallow: Able to  elicit    Oral/Motor/Sensory Function Overall Oral Motor/Sensory Function: Within functional limits   Ice Chips Ice chips: Within functional limits Presentation: Spoon   Thin Liquid Thin Liquid: Within functional limits Presentation: Cup;Straw    Nectar Thick Nectar Thick Liquid: Not tested   Honey Thick Honey Thick Liquid: Not tested   Puree Puree: Within functional limits Presentation: Spoon   Solid Solid: Within functional limits Presentation: Self Fed       ADAMS,PAT, M.S., CCC-SLP 11/19/2015,9:20 AM

## 2015-11-19 NOTE — Clinical Social Work Placement (Signed)
   CLINICAL SOCIAL WORK PLACEMENT  NOTE  Date:  11/19/2015  Patient Details  Name: [name] MRN: [id number] Date of Birth: 1928/01/09  Clinical Social Work is seeking post-discharge placement for this patient at the Skilled  Nursing Facility level of care (*CSW will initial, date and re-position this form in  chart as items are completed):  Yes   Patient/family provided with [place] Clinical Social Work Department's list of facilities offering this level of care within the geographic area requested by the patient (or if unable, by the patient's family).  Yes   Patient/family informed of their freedom to choose among providers that offer the needed level of care, that participate in Medicare, Medicaid or managed care program needed by the patient, have an available bed and are willing to accept the patient.  Yes   Patient/family informed of [facility] ownership interest in [facility] and [facility], as well as of the fact that they are under no obligation to receive care at these facilities.  PASRR submitted to EDS on       PASRR number received on       Existing PASRR number confirmed on 11/19/15     FL2 transmitted to all facilities in geographic area requested by pt/family on 11/19/15     FL2 transmitted to all facilities within larger geographic area on       Patient informed that his/her managed care company has contracts with or will negotiate with certain facilities, including the following:            Patient/family informed of bed offers received.  Patient chooses bed at       Physician recommends and patient chooses bed at      Patient to be transferred to   on  .  Patient to be transferred to facility by       Patient family notified on   of transfer.  Name of family member notified:        PHYSICIAN Please sign FL2     Additional Comment:    _______________________________________________ Izora RibasHoloman, Aasiyah Auerbach M, LCSW 11/19/2015, 4:56  PM

## 2015-11-19 NOTE — Clinical Social Work Note (Signed)
Clinical Social Work Assessment  Patient Details  Name: Dylan BennettRichard J Danielson MRN: [id number] Date of Birth: 07-21-1928  Date of referral:  11/19/15               Reason for consult:  Facility Placement                Permission sought to share information with:  Family Supports Permission granted to share information::  Yes, Verbal Permission Granted  Name::     Venita SheffieldGladys  [facility]  Relationship::  wife  Contact Information:     Housing/Transportation Living arrangements for the past 2 months:  Single Family Home Source of Information:  Patient Patient Interpreter Needed:  None Criminal Activity/Legal Involvement Pertinent to Current Situation/Hospitalization:  No - Comment as needed Significant Relationships:  Spouse Lives with:  Spouse Do you feel safe going back to the place where you live?  No Need for family participation in patient care:  No (Coment)  Care giving concerns:  Pt lives at home with wife- not enough physical support at home with current impairment   Office managerocial Worker assessment / plan:  CSW spoke with pt concerning PT recommendation for SNF.  Employment status:  Retired Database administratornsurance information:  Managed Medicare PT Recommendations:  Inpatient Rehab Consult Information / Referral to community resources:  Skilled Nursing Facility  Patient/Family's Response to care:  Pt agreeable to SNF- has been multiple times in the past- [facility]  Patient/Family's Understanding of and Emotional Response to Diagnosis, Current Treatment, and Prognosis:  No questions or concerns  Emotional Assessment Appearance:  Appears stated age Attitude/Demeanor/Rapport:    Affect (typically observed):  Appropriate Orientation:  Oriented to Self, Oriented to Place, Oriented to  Time, Oriented to Situation Alcohol / Substance use:    Psych involvement (Current and /or in the community):     Discharge Needs  Concerns to be addressed:  Care  Coordination Readmission within the last 30 days:  No Current discharge risk:  Physical Impairment Barriers to Discharge:  Continued Medical Work up   Izora RibasHoloman, Malijah Lietz M, LCSW 11/19/2015, 4:54 PM

## 2015-11-20 ENCOUNTER — Inpatient Hospital Stay (HOSPITAL_COMMUNITY): Payer: Medicare Other

## 2015-11-20 LAB — GLUCOSE, CAPILLARY
GLUCOSE-CAPILLARY: 209 mg/dL — AB (ref 65–99)
Glucose-Capillary: 191 mg/dL — ABNORMAL HIGH (ref 65–99)
Glucose-Capillary: 227 mg/dL — ABNORMAL HIGH (ref 65–99)
Glucose-Capillary: 228 mg/dL — ABNORMAL HIGH (ref 65–99)

## 2015-11-20 LAB — BRAIN NATRIURETIC PEPTIDE: B NATRIURETIC PEPTIDE 5: 1407.2 pg/mL — AB (ref 0.0–100.0)

## 2015-11-20 LAB — CBC
HEMATOCRIT: 29.5 % — AB (ref 39.0–52.0)
HEMOGLOBIN: 9.9 g/dL — AB (ref 13.0–17.0)
MCH: 33.3 pg (ref 26.0–34.0)
MCHC: 33.6 g/dL (ref 30.0–36.0)
MCV: 99.3 fL (ref 78.0–100.0)
Platelets: 267 10*3/uL (ref 150–400)
RBC: 2.97 MIL/uL — ABNORMAL LOW (ref 4.22–5.81)
RDW: 14.2 % (ref 11.5–15.5)
WBC: 10.1 10*3/uL (ref 4.0–10.5)

## 2015-11-20 LAB — COMPREHENSIVE METABOLIC PANEL
ALBUMIN: 2.2 g/dL — AB (ref 3.5–5.0)
ALT: 43 U/L (ref 17–63)
AST: 65 U/L — AB (ref 15–41)
Alkaline Phosphatase: 50 U/L (ref 38–126)
Anion gap: 9 (ref 5–15)
BILIRUBIN TOTAL: 1.2 mg/dL (ref 0.3–1.2)
BUN: 46 mg/dL — AB (ref 6–20)
CHLORIDE: 102 mmol/L (ref 101–111)
CO2: 28 mmol/L (ref 22–32)
CREATININE: 1.56 mg/dL — AB (ref 0.61–1.24)
Calcium: 8.3 mg/dL — ABNORMAL LOW (ref 8.9–10.3)
GFR calc Af Amer: 45 mL/min — ABNORMAL LOW (ref >60)
GFR calc non Af Amer: 39 mL/min — ABNORMAL LOW (ref >60)
GLUCOSE: 240 mg/dL — AB (ref 65–99)
POTASSIUM: 4.9 mmol/L (ref 3.5–5.1)
Sodium: 139 mmol/L (ref 135–145)
TOTAL PROTEIN: 5.9 g/dL — AB (ref 6.5–8.1)

## 2015-11-20 LAB — CULTURE, BLOOD (ROUTINE X 2)
CULTURE: NO GROWTH
Culture: NO GROWTH

## 2015-11-20 LAB — BLOOD GAS, ARTERIAL
ACID-BASE EXCESS: 2.6 mmol/L — AB (ref 0.0–2.0)
Bicarbonate: 26.5 mEq/L — ABNORMAL HIGH (ref 20.0–24.0)
DRAWN BY: 129711
O2 CONTENT: 5 L/min
O2 Saturation: 88.9 %
PATIENT TEMPERATURE: 98.6
TCO2: 27.7 mmol/L (ref 0–100)
pCO2 arterial: 40.4 mmHg (ref 35.0–45.0)
pH, Arterial: 7.433 (ref 7.350–7.450)
pO2, Arterial: 58.8 mmHg — ABNORMAL LOW (ref 80.0–100.0)

## 2015-11-20 MED ORDER — BENZONATATE 100 MG PO CAPS
100.0000 mg | ORAL_CAPSULE | Freq: Three times a day (TID) | ORAL | Status: DC | PRN
  Administered 2015-11-20 – 2015-11-22 (×3): 100 mg via ORAL
  Filled 2015-11-20 (×3): qty 1

## 2015-11-20 MED ORDER — FUROSEMIDE 10 MG/ML IJ SOLN
40.0000 mg | Freq: Once | INTRAMUSCULAR | Status: AC
  Administered 2015-11-20: 40 mg via INTRAVENOUS
  Filled 2015-11-20: qty 4

## 2015-11-20 MED ORDER — FUROSEMIDE 10 MG/ML IJ SOLN
60.0000 mg | Freq: Once | INTRAMUSCULAR | Status: AC
  Administered 2015-11-20: 60 mg via INTRAVENOUS
  Filled 2015-11-20: qty 6

## 2015-11-20 MED ORDER — DEXTROMETHORPHAN POLISTIREX ER 30 MG/5ML PO SUER
60.0000 mg | Freq: Once | ORAL | Status: AC
  Administered 2015-11-20: 60 mg via ORAL
  Filled 2015-11-20: qty 10

## 2015-11-20 NOTE — Progress Notes (Signed)
RT note- Asked to assess and treat patient with PRN nebulizer. Moderately short of breath with audible and expiratory wheezes, RR-28, non productive stronge cough. Slightly improved post treatment. ABG ordered and drawn. Remains on 5l/min [place].

## 2015-11-20 NOTE — Progress Notes (Signed)
11/20/2015 3:02 AM Pt started having strong coughing fit three hours after Tussionex was given.  Dr. Clearence PedSchorr was informed and order to give the pt Delsym 60 mg by mouth once.  Will continue to monitor pt. Harriet Massonavidson, Jamaurion Slemmer E, RN

## 2015-11-20 NOTE — Progress Notes (Signed)
Pt was having continuous coughing fits without much relief.  No coughing medicine was available.  Dr. Clearence PedSchorr was informed and ordered a one time dose of Tussionex 5 mL by mouth.  Dr. Alden ServerShorr also ordered to give the patient a nebulizer treatment.  Pt has received both the Tussionex and neb treatment.  Will continue to monitor pt. Harriet Massonavidson, Allee Busk E, RN

## 2015-11-20 NOTE — Progress Notes (Signed)
Inpatient Diabetes Program Recommendations  AACE/ADA: New Consensus Statement on Inpatient Glycemic Control (2015)  Target Ranges:  Prepandial:   less than 140 mg/dL      Peak postprandial:   less than 180 mg/dL (1-2 hours)      Critically ill patients:  140 - 180 mg/dL  Results for [name], Naithen J (MRN [id number]) as of 11/20/2015 10:02  Ref. Range 11/19/2015 08:02 11/19/2015 12:06 11/19/2015 16:14 11/19/2015 22:10 11/20/2015 06:43  Glucose-Capillary Latest Ref Range: 65-99 mg/dL 409120 (H) 811217 (H) 914221 (H) 261 (H) 191 (H)   Review of Glycemic Control  Current orders for Inpatient glycemic control: Lantus 20 units QHS, Novolog 0-9 units TID with meals  Inpatient Diabetes Program Recommendations: Correction (SSI): Please consider ordering Novolog bedtime correction scale. Insulin - Meal Coverage: Please consider ordering Novolog 3 units TID with meals for meal coverage (in addition to Novolog correction scale).  Thanks, Orlando PennerMarie Baine Decesare, RN, MSN, CDE Diabetes Coordinator Inpatient Diabetes Program [phone] (Team Pager from 8am to 5pm) [phone] ([facility] office) [phone] [facility] office) [phone] [facility] office)

## 2015-11-20 NOTE — Progress Notes (Signed)
PROGRESS NOTE  [name]:[id number] DOB: 1928-11-09 DOA: 11/11/2015 PCP: Gwen Pounds, MD  Admit HPI / Brief Narrative: 81 y.o. male with a past medical history significant for CHF, CAD s/p CABG, HTN, and IDDM who presented with fever, confusion, and cough.  The patient had increasing dyspnea on exertion and breathing difficulty for a month. He went to his PCP who prescribed home oxygen 2 L by nasal cannula. The patient then developed a fever and seemed more confused than usual, so his wife brought him to the ER.  In the ED, the patient had a low-grade fever to 100 F, tachycardia, tachypnea, hypertension, and hypoxia requiring 4 L of supplemental oxygen. His renal function was normal but he had an elevated BUN to creatinine ratio. The lactic acid level was normal. He had an high normal WBC. His troponin was 2.2 ng per mL, and BNP was moderately elevated. Chest x-ray showed pulmonary edema. An ECG showed no ST elevations.  Since admit: Admitted on 12/21 with NSTEMI, had a cath 12/22, no interventions made. Developed HCAP, intubated 12/24.  Extubated 12/26.  Significant Events: 12/21 NSTEMI 12/22 Cardiac cath - diffuse dx 12/24 intubated  HPI/Subjective: Had wheezing and SOB today-- more confused per nursing  Assessment/Plan:  L lung HCAP - Acute respiratory failure with hypoxemia  completed a 7 day course of abx tx today - WBC has normalized - afebrile - still requiring 4L [place] oxygen support - attempt to wean O2   NSTEMI - known hx of CAD s/p CABG Diffuse dx by cath 12/22 (Only LIMA patent. SVGs occluded) - Cards rec med management w/ DAPT  -appears volume overloaded: lasix x 1 dose with 500 out, will give another dose tonight, wheezing resolved  CKD stage III Baseline crt appears to be ~1.2 -  recheck in AM   Hypotension Resolved  Hx HTN BP currently reasonably controlled but there is room for improvement - increase norvasc - follow trend   DM CBG variable -  follow w/o change today   GERD  Code Status: FULL Family Communication: wife at bedside Disposition Plan: SNf in AM  Consultants: PCCM [facility]   Antibiotics: 12/22 fortaz > 12/28 12/22 vanc > 12/26  DVT prophylaxis: lovenox  Objective: Blood pressure 122/47, pulse 66, temperature 98.4 F (36.9 C), temperature source Oral, resp. rate 19, height  (1.753 m), weight 89.812 kg (198 lb), SpO2 95 %.  Intake/Output Summary (Last 24 hours) at 11/20/15 1414 Last data filed at 11/20/15 1404  Gross per 24 hour  Intake    930 ml  Output   1500 ml  Net   -570 ml   Exam: General: No acute respiratory distress at rest  Lungs: wheezing b/l Cardiovascular: Regular rate and rhythm without murmur gallop or rub normal S1 and S2 Abdomen: Nontender, nondistended, soft, bowel sounds positive, no rebound, no ascites, no appreciable mass Extremities: No significant cyanosis, clubbing, or edema bilateral lower extremities  Data Reviewed: Basic Metabolic Panel:  Recent Labs Lab 11/15/15 0511 11/16/15 0500 11/17/15 0408 11/18/15 0239 11/19/15 0356 11/20/15 0322  NA 139 137 140 139 140 139  K 3.5 3.7 3.9 4.1 4.2 4.9  CL 100* 98* 103 106 105 102  CO2 GLUCOSE 142* 195* 150* 185* 126* 240*  BUN 42* 57* 66* 65* 54* 46*  CREATININE 1.61* 2.60* 2.42* 1.88* 1.53* 1.56*  CALCIUM 8.1* 8.0* 7.8* 7.8* 8.2* 8.3*  MG 1.8  --  1.9  --   --   --  PHOS  --   --  3.5  --   --   --     CBC:  Recent Labs Lab 11/15/15 0511 11/16/15 0500 11/17/15 0408 11/18/15 0239 11/19/15 0356 11/20/15 0322  WBC 7.8 7.1 7.0 6.7 7.7 10.1  NEUTROABS 6.5  --   --  4.8  --   --   HGB 12.1* 12.2* 11.2* 10.2* 9.8* 9.9*  HCT 35.5* 35.3* 33.8* 30.0* 29.6* 29.5*  MCV 99.7 98.3 98.3 98.7 98.3 99.3  PLT 167 168 169 164 211 267   Coags: No results for input(s): INR in the last 168 hours.  Invalid input(s): PT Cardiac Enzymes: No results for input(s): CKTOTAL, CKMB, CKMBINDEX,  TROPONINI in the last 168 hours.  CBG:  Recent Labs Lab 11/19/15 1206 11/19/15 1614 11/19/15 2210 11/20/15 0643 11/20/15 1144  GLUCAP 217* 221* 261* 191* 209*    Recent Results (from the past 240 hour(s))  Culture, blood (routine x 2)     Status: None   Collection Time: 11/11/15  9:45 PM  Result Value Ref Range Status   Specimen Description BLOOD RIGHT ANTECUBITAL  Final   Special Requests BOTTLES DRAWN AEROBIC AND ANAEROBIC  Final   Culture   Final    NO GROWTH 5 DAYS Performed at [facility]    Report Status 11/16/2015 FINAL  Final  Urine culture     Status: None   Collection Time: 11/12/15 12:28 AM  Result Value Ref Range Status   Specimen Description URINE, RANDOM  Final   Special Requests NONE  Final   Culture   Final    NO GROWTH 1 DAY Performed at [facility]    Report Status 11/13/2015 FINAL  Final  MRSA PCR Screening     Status: None   Collection Time: 11/12/15  2:56 AM  Result Value Ref Range Status   MRSA by PCR NEGATIVE NEGATIVE Final    Comment:        The GeneXpert MRSA Assay (FDA approved for NASAL specimens only), is one component of a comprehensive MRSA colonization surveillance program. It is not intended to diagnose MRSA infection nor to guide or monitor treatment for MRSA infections.   Culture, sputum-assessment     Status: None   Collection Time: 11/14/15  1:40 AM  Result Value Ref Range Status   Specimen Description EXPECTORATED SPUTUM  Final   Special Requests NONE  Final   Sputum evaluation   Final    MICROSCOPIC FINDINGS SUGGEST THAT THIS SPECIMEN IS NOT REPRESENTATIVE OF LOWER RESPIRATORY SECRETIONS. PLEASE RECOLLECT. Gram Stain Report Called to,Read Back By and Verified With: C BENGE  11/14/15 MKELLY    Report Status 11/14/2015 FINAL  Final  Culture, respiratory (NON-Expectorated)     Status: None   Collection Time: 11/15/15  1:24 PM  Result Value Ref Range Status   Specimen Description TRACHEAL  ASPIRATE  Final   Special Requests Normal  Final   Gram Stain   Final    ABUNDANT WBC FEW SQUAMOUS EPITHELIAL CELLS PRESENT FEW YEAST Performed at [facility]    Culture   Final    ABUNDANT YEAST CONSISTENT WITH CANDIDA SPECIES Performed at [facility]    Report Status 11/18/2015 FINAL  Final  Culture, blood (Routine X 2) w Reflex to ID Panel     Status: None   Collection Time: 11/15/15  3:15 PM  Result Value Ref Range Status   Specimen Description BLOOD LEFT ANTECUBITAL  Final  Special Requests BOTTLES DRAWN AEROBIC AND ANAEROBIC 10CC  Final   Culture NO GROWTH 5 DAYS  Final   Report Status 11/20/2015 FINAL  Final  Culture, blood (Routine X 2) w Reflex to ID Panel     Status: None   Collection Time: 11/15/15  3:30 PM  Result Value Ref Range Status   Specimen Description BLOOD BLOOD LEFT HAND  Final   Special Requests BOTTLES DRAWN AEROBIC AND ANAEROBIC 10CC  Final   Culture NO GROWTH 5 DAYS  Final   Report Status 11/20/2015 FINAL  Final       Scheduled Meds:  Scheduled Meds: . amLODipine  5 mg Oral Daily  . antiseptic oral rinse  7 mL Mouth Rinse q12n4p  . aspirin EC  81 mg Oral Daily  . atenolol  50 mg Oral Daily  . chlorhexidine  15 mL Mouth Rinse BID  . clopidogrel  75 mg Oral Daily  . enoxaparin (LOVENOX) injection  40 mg Subcutaneous Q24H  . furosemide  40 mg Intravenous Once  . gabapentin  300 mg Oral BID  . insulin aspart  0-9 Units Subcutaneous TID WC  . insulin glargine  20 Units Subcutaneous QHS  . isosorbide mononitrate  30 mg Oral Daily  . pantoprazole  40 mg Oral Daily  . ranolazine  1,000 mg Oral BID  . sodium chloride  3 mL Intravenous Q12H    Time spent on care of this patient: 25 mins   Yumi Insalaco Juanetta GoslingU Rilei Kravitz , DO   [facility] Office  [phone] Pager - Text Page per Loretha StaplerAmion as per below:  On-Call/Text Page:      [email or web address]      password [email or web address]  If 7PM-7AM, please contact night-coverage [email or web address] Password  [email or web address] 11/20/2015, 2:14 PM   LOS: 8 days

## 2015-11-20 NOTE — Progress Notes (Signed)
Speech Language Pathology Treatment: Dysphagia  Patient Details Name: Dylan BennettRichard J Barron MRN: [id number] DOB: 06/26/1928 Today's Date: 11/20/2015 Time: [id number] SLP Time Calculation (min) (ACUTE ONLY): 20 min  Assessment / Plan / Recommendation Clinical Impression  Pt's respiratory status has decreased since initial BSE on 11/18/15 as pt exhibited audible wheezing upon SLP arrival after breakfast tray intake and [place] with 4L given continuously; nectar-thickened liquids utilized with pt with minimal verbal cueing and he swallowed without difficulty noted via cup; downgrade recommended at this time d/t decreased respiratory status and balance with swallowing/breathing compromised at this time; MBS recommended as well to r/o aspiration and or determine safest diet for po intake at present.   HPI HPI: 81 y.o. male with a past medical history significant for HFpEF, CAD s/p CABG, HTN, and IDDM who presents with fever, confusion, and cough.      SLP Plan  Goals updated     Recommendations  Diet recommendations: Dysphagia 3 (mechanical soft);Nectar-thick liquid Liquids provided via: Cup;No straw Medication Administration: Whole meds with puree Supervision: Staff to assist with self feeding;Intermittent supervision to cue for compensatory strategies Compensations: Slow rate;Small sips/bites Postural Changes and/or Swallow Maneuvers: Seated upright 90 degrees              Oral Care Recommendations: Oral care BID Follow up Recommendations: Other (comment) (TBD) Plan: Goals updated   Eliska Hamil,PAT, M.S., CCC-SLP 11/20/2015, 9:34 AM

## 2015-11-20 NOTE — Progress Notes (Addendum)
Patient discussed with Dr. Benjamine MolaVann. Not stable for d/c today; hopefully tomorrow. Current plan is for d/c to [facility] when stable. Updated admissions at facility- anticipates a bed to be available tomorrow.CSW discussed with patient who [facility] He stated that he would notify his wife of above. Patient is alert and oriented.   Lorri Frederickonna T. Jaci LazierCrowder, [place]CSW [phone]

## 2015-11-21 ENCOUNTER — Inpatient Hospital Stay (HOSPITAL_COMMUNITY): Payer: Medicare Other

## 2015-11-21 DIAGNOSIS — I509 Heart failure, unspecified: Secondary | ICD-10-CM

## 2015-11-21 LAB — BASIC METABOLIC PANEL
Anion gap: 9 (ref 5–15)
BUN: 43 mg/dL — AB (ref 6–20)
CHLORIDE: 102 mmol/L (ref 101–111)
CO2: 29 mmol/L (ref 22–32)
CREATININE: 1.66 mg/dL — AB (ref 0.61–1.24)
Calcium: 8.2 mg/dL — ABNORMAL LOW (ref 8.9–10.3)
GFR, EST AFRICAN AMERICAN: 42 mL/min — AB (ref >60)
GFR, EST NON AFRICAN AMERICAN: 36 mL/min — AB (ref >60)
Glucose, Bld: 180 mg/dL — ABNORMAL HIGH (ref 65–99)
POTASSIUM: 4.3 mmol/L (ref 3.5–5.1)
SODIUM: 140 mmol/L (ref 135–145)

## 2015-11-21 LAB — GLUCOSE, CAPILLARY
GLUCOSE-CAPILLARY: 236 mg/dL — AB (ref 65–99)
GLUCOSE-CAPILLARY: 183 mg/dL — AB (ref 65–99)
Glucose-Capillary: 175 mg/dL — ABNORMAL HIGH (ref 65–99)
Glucose-Capillary: 177 mg/dL — ABNORMAL HIGH (ref 65–99)

## 2015-11-21 LAB — CBC
HCT: 28.3 % — ABNORMAL LOW (ref 39.0–52.0)
Hemoglobin: 9.3 g/dL — ABNORMAL LOW (ref 13.0–17.0)
MCH: 32.6 pg (ref 26.0–34.0)
MCHC: 32.9 g/dL (ref 30.0–36.0)
MCV: 99.3 fL (ref 78.0–100.0)
PLATELETS: 299 10*3/uL (ref 150–400)
RBC: 2.85 MIL/uL — AB (ref 4.22–5.81)
RDW: 14.2 % (ref 11.5–15.5)
WBC: 10.3 10*3/uL (ref 4.0–10.5)

## 2015-11-21 MED ORDER — ALBUTEROL SULFATE (2.5 MG/3ML) 0.083% IN NEBU
2.5000 mg | INHALATION_SOLUTION | Freq: Four times a day (QID) | RESPIRATORY_TRACT | Status: DC
  Administered 2015-11-21 – 2015-11-22 (×5): 2.5 mg via RESPIRATORY_TRACT
  Filled 2015-11-21 (×5): qty 3

## 2015-11-21 MED ORDER — FUROSEMIDE 10 MG/ML IJ SOLN
60.0000 mg | Freq: Two times a day (BID) | INTRAMUSCULAR | Status: DC
  Administered 2015-11-21 – 2015-11-22 (×4): 60 mg via INTRAVENOUS
  Filled 2015-11-21 (×4): qty 6

## 2015-11-21 MED ORDER — ALBUTEROL SULFATE (2.5 MG/3ML) 0.083% IN NEBU: 2.5000 mg | INHALATION_SOLUTION | Freq: Four times a day (QID) | RESPIRATORY_TRACT | Status: DC

## 2015-11-21 NOTE — Progress Notes (Signed)
Patient Name: Dylan DimesRichard J Schwimmer Date of Encounter: 11/21/2015     Principal Problem:   CAP (community acquired pneumonia) Active Problems:   Essential hypertension   Diabetes mellitus with neurological manifestation (HCC)   CHF (congestive heart failure) (HCC)   Elevated troponin   CKD (chronic kidney disease), stage III   Altered mental state   NSTEMI (non-ST elevated myocardial infarction) (HCC)   Acute congestive heart failure (HCC)   Pressure ulcer   Acute on chronic diastolic CHF (congestive heart failure) (HCC)   Acute respiratory failure with hypoxia (HCC)    SUBJECTIVE  Underwent cath 12/22 with severe CAD. Only LIMA patent. SVGs occluded. Recommended medical therapy. IV lasix increased yesterday. Weight unchanged. Creatinine bumping.   Was seen several weeks ago. Has had increasing respiratory issues and we were asked to see him again  Has significant DOE Echo Dec. 21 shows EF 50-55%      CURRENT MEDS . amLODipine  5 mg Oral Daily  . antiseptic oral rinse  7 mL Mouth Rinse q12n4p  . aspirin EC  81 mg Oral Daily  . atenolol  50 mg Oral Daily  . chlorhexidine  15 mL Mouth Rinse BID  . clopidogrel  75 mg Oral Daily  . enoxaparin (LOVENOX) injection  40 mg Subcutaneous Q24H  . furosemide  60 mg Intravenous BID  . gabapentin  300 mg Oral BID  . insulin aspart  0-9 Units Subcutaneous TID WC  . insulin glargine  20 Units Subcutaneous QHS  . isosorbide mononitrate  30 mg Oral Daily  . pantoprazole  40 mg Oral Daily  . ranolazine  1,000 mg Oral BID  . sodium chloride  3 mL Intravenous Q12H    OBJECTIVE  Filed Vitals:   11/20/15 0938 11/20/15 1404 11/20/15 2006 11/21/15 0600  BP:  122/47 129/45 140/52  Pulse:  66 65 65  Temp:  98.4 F (36.9 C) 98.3 F (36.8 C) 98 F (36.7 C)  TempSrc:  Oral Oral Oral  Resp:  19 18 18   Height:      Weight:    194 lb 14.2 oz (88.4 kg)  SpO2: 94% 95% 100% 94%    Intake/Output Summary (Last 24 hours) at 11/21/15  1417 Last data filed at 11/21/15 0800  Gross per 24 hour  Intake    600 ml  Output   1525 ml  Net   -925 ml   Filed Weights   11/19/15 0412 11/20/15 0335 11/21/15 0600  Weight: 181 lb 3.2 oz (82.192 kg) 198 lb (89.812 kg) 194 lb 14.2 oz (88.4 kg)    PHYSICAL EXAM  General: sleeping .   Neuro:  NA Psych: Normal affect. HEENT:  [place]  Neck: Supple without bruits or JVD. Lungs:  Diffuse rhonchi and crackles , few wheezes Heart: RRR no s3, s4, 2/6 SEM RUSB Abdomen: Soft, non-tender, non-distended, BS + x 4.  Extremities: No clubbing, cyanosis or edema. DP/PT/Radials 2+ and equal bilaterally.  Accessory Clinical Findings  CBC  Recent Labs  11/20/15 0322 11/21/15 0242  WBC 10.1 10.3  HGB 9.9* 9.3*  HCT 29.5* 28.3*  MCV 99.3 99.3  PLT 267 299   Basic Metabolic Panel  Recent Labs  11/20/15 0322 11/21/15 0242  NA 139 140  K 4.9 4.3  CL 102 102  CO2 28 29  GLUCOSE 240* 180*  BUN 46* 43*  CREATININE 1.56* 1.66*  CALCIUM 8.3* 8.2*   Liver Function Tests  Recent Labs  11/20/15 0322  AST  65*  ALT 43  ALKPHOS 50  BILITOT 1.2  PROT 5.9*  ALBUMIN 2.2*   No results for input(s): LIPASE, AMYLASE in the last 72 hours. Cardiac Enzymes No results for input(s): CKTOTAL, CKMB, CKMBINDEX, TROPONINI in the last 72 hours. BNP Invalid input(s): POCBNP D-Dimer No results for input(s): DDIMER in the last 72 hours. Hemoglobin A1C No results for input(s): HGBA1C in the last 72 hours. Fasting Lipid Panel No results for input(s): CHOL, HDL, LDLCALC, TRIG, CHOLHDL, LDLDIRECT in the last 72 hours. Thyroid Function Tests No results for input(s): TSH, T4TOTAL, T3FREE, THYROIDAB in the last 72 hours.  Invalid input(s): FREET3  TELE  normal sinus rhythm with PACs     Radiology/Studies    ASSESSMENT AND PLAN 1.  Non-STEMI. Status post remote CABG. Cardiac cath 11/13/15 -->medical therapy. DAPT.  I have reviewed the angiograms and agree that medical therapy is  indicated   2.  CAP (community acquired pneumonia) with acute respiratory failure  3.  Acute diastolic heart failure with ejection fraction 50-55% by echo I suspect that his LV function is still about the same as it was on Dec. 21.   Continue diuresis and will add nebs.   Acute on CKD (chronic kidney disease), stage III secondary to DMII Acute delirium    Breann Losano, Deloris Ping, MD  11/21/2015 2:21 PM    [facility] [address] [place],  [address] [place], [place]  [address] Pager [phone] Phone: [phone]; Fax: [phone]

## 2015-11-21 NOTE — Progress Notes (Signed)
Utilization review completed.  

## 2015-11-21 NOTE — Progress Notes (Signed)
No needs expressed at this time, no pain or distress. Patient is very drowsy, call light within reach.

## 2015-11-21 NOTE — Progress Notes (Signed)
MBSS complete. Full report located under chart review in imaging section. Lauren Aguayo, MA CCC-SLP [phone]  

## 2015-11-21 NOTE — Progress Notes (Signed)
Physical Therapy Treatment Patient Details Name: Dylan BennettRichard J Barron MRN: [id number] DOB: 08-Dec-1927 Today's Date: 11/21/2015    History of Present Illness 81 y/o male with CAD, admitted on 12/21 with NSTEMI, had a cath 12/22, no interventions made. Developed HCAP, intubated 12/24.     PT Comments    Pt able to ambulate this session however remains to have extremely low activity tolerance. Pt amb 10' and then was unable to tolerate LE there ex and returned to sleep in chair despite max v/c's to stay awake. +SOB, spO2 > 95% on 2Lo2 via [place]. Pt remains appropriate for SNF upon d/c for maximal functional recovery.   Follow Up Recommendations  SNF     Equipment Recommendations       Recommendations for Other Services       Precautions / Restrictions Precautions Precautions: Fall Restrictions Weight Bearing Restrictions: No    Mobility  Bed Mobility               General bed mobility comments: pt up in chair  Transfers Overall transfer level: Needs assistance Equipment used: Rolling walker (2 wheeled) Transfers: Sit to/from Stand Sit to Stand: Mod assist;+2 physical assistance         General transfer comment: used of bed pad to assist into standing, max v/c's for safe hand placement  Ambulation/Gait Ambulation/Gait assistance: Mod assist;+2 physical assistance;+2 safety/equipment Ambulation Distance (Feet): 10 Feet Assistive device: Rolling walker (2 wheeled) Gait Pattern/deviations: Step-to pattern;Decreased stride length;Shuffle Gait velocity: slow   General Gait Details: pt unable to stand upright, amb with trunk flexion so he was parallel to flloor despite max v/c's to achieve trunk extension   Stairs            Wheelchair Mobility    Modified Rankin (Stroke Patients Only)       Balance Overall balance assessment: Needs assistance         Standing balance support: Bilateral upper extremity supported Standing balance-Leahy Scale:  Zero Standing balance comment: unable to achieve full upright posture                    Cognition Arousal/Alertness: Awake/alert (but sleepy) Behavior During Therapy: WFL for tasks assessed/performed Overall Cognitive Status: Within Functional Limits for tasks assessed                      Exercises      General Comments        Pertinent Vitals/Pain Pain Assessment: Faces Faces Pain Scale: Hurts even more Pain Location: R LE when coughing Pain Descriptors / Indicators: Discomfort Pain Intervention(s): Limited activity within patient's tolerance    Home Living                      Prior Function            PT Goals (current goals can now be found in the care plan section) Acute Rehab PT Goals Patient Stated Goal: go home    Frequency  Min 2X/week    PT Plan Discharge plan needs to be updated    Co-evaluation             End of Session Equipment Utilized During Treatment: Gait belt;Oxygen Activity Tolerance: Patient limited by fatigue Patient left: in chair;with call bell/phone within reach;with chair alarm set     Time: [id number] PT Time Calculation (min) (ACUTE ONLY): 16 min  Charges:  $Gait Training: 8-22 mins  G CodesMarcene Brawn 11/21/2015, 3:38 PM   Lewis Shock, PT, DPT Pager #: [phone] Office #: [phone]

## 2015-11-21 NOTE — Progress Notes (Signed)
PROGRESS NOTE  Dylan BennettRichard J Barron AOZ:[id number] DOB: 09-11-1928 DOA: 11/11/2015 PCP: Gwen PoundsUSSO,JOHN M, MD  Admit HPI / Brief Narrative: 81 y.o. male with a past medical history significant for CHF, CAD s/p CABG, HTN, and IDDM who presented with fever, confusion, and cough.  The patient had increasing dyspnea on exertion and breathing difficulty for a month. He went to his PCP who prescribed home oxygen 2 L by nasal cannula. The patient then developed a fever and seemed more confused than usual, so his wife brought him to the ER.  In the ED, the patient had a low-grade fever to 100 F, tachycardia, tachypnea, hypertension, and hypoxia requiring 4 L of supplemental oxygen. His renal function was normal but he had an elevated BUN to creatinine ratio. The lactic acid level was normal. He had an high normal WBC. His troponin was 2.2 ng per mL, and BNP was moderately elevated. Chest x-ray showed pulmonary edema. An ECG showed no ST elevations.  Since admit: Admitted on 12/21 with NSTEMI, had a cath 12/22, no interventions made. Developed HCAP, intubated 12/24.  Extubated 12/26.  Severe wheezing and respiratory distress on 12/29-- lasix given due to elevated BNP.  Cardiology re-consulted  Significant Events: 12/21 NSTEMI 12/22 Cardiac cath - diffuse dx 12/24 intubated  HPI/Subjective: Still wheezing and SOB Coughing hurts his hip  Assessment/Plan:  L lung HCAP - Acute respiratory failure with hypoxemia  completed a 7 day course of abx tx - WBC has normalized - afebrile - still requiring 4L [place] oxygen support - attempt to wean O2   NSTEMI - known hx of CAD s/p CABG Diffuse dx by cath 12/22 (Only LIMA patent. SVGs occluded) - Cards rec med management w/ DAPT  -appears volume overloaded: lasix 60 mg BID-- re-consult cards---- ? If needs repeat echo-- no murmur heard - down 1L yesterday  CKD stage III Baseline crt appears to be ~1.2 -  recheck in AM with diuresis  Hypotension Resolved  Hx  HTN follow  DM CBG variable   GERD  Code Status: FULL Family Communication: wife at bedside 12/29 Disposition Plan: SNf once breathing status improved  [facility]   Antibiotics: 12/22 fortaz > 12/28 12/22 vanc > 12/26  DVT prophylaxis: lovenox  Objective: Blood pressure 140/52, pulse 65, temperature 98 F (36.7 C), temperature source Oral, resp. rate 18, height 5\' 9"  (1.753 m), weight 88.4 kg (194 lb 14.2 oz), SpO2 94 %.  Intake/Output Summary (Last 24 hours) at 11/21/15 1147 Last data filed at 11/21/15 0800  Gross per 24 hour  Intake    840 ml  Output   2025 ml  Net  -1185 ml   Exam: General: still working to breathe, cough+ Lungs: wheezing b/l, rhonchus Cardiovascular: Regular rate and rhythm- did not hear murmur Abdomen: Nontender, nondistended, soft, bowel sounds positive, no rebound, no ascites, no appreciable mass Extremities: No significant cyanosis, clubbing, or edema bilateral lower extremities  Data Reviewed: Basic Metabolic Panel:  Recent Labs Lab 11/15/15 0511  11/17/15 0408 11/18/15 0239 11/19/15 0356 11/20/15 0322 11/21/15 0242  NA 139  < > 140 139 140 139 140  K 3.5  < > 3.9 4.1 4.2 4.9 4.3  CL 100*  < > 103 106 105 102 102  CO2 30  < > 27 25 26 28 29   GLUCOSE 142*  < > 150* 185* 126* 240* 180*  BUN 42*  < > 66* 65* 54* 46* 43*  CREATININE 1.61*  < > 2.42* 1.88*  1.53* 1.56* 1.66*  CALCIUM 8.1*  < > 7.8* 7.8* 8.2* 8.3* 8.2*  MG 1.8  --  1.9  --   --   --   --   PHOS  --   --  3.5  --   --   --   --   < > = values in this interval not displayed.  CBC:  Recent Labs Lab 11/15/15 0511  11/17/15 0408 11/18/15 0239 11/19/15 0356 11/20/15 0322 11/21/15 0242  WBC 7.8  < > 7.0 6.7 7.7 10.1 10.3  NEUTROABS 6.5  --   --  4.8  --   --   --   HGB 12.1*  < > 11.2* 10.2* 9.8* 9.9* 9.3*  HCT 35.5*  < > 33.8* 30.0* 29.6* 29.5* 28.3*  MCV 99.7  < > 98.3 98.7 98.3 99.3 99.3  PLT 167  < > 169 164 211 267 299  < > =  values in this interval not displayed. Coags: No results for input(s): INR in the last 168 hours.  Invalid input(s): PT Cardiac Enzymes: No results for input(s): CKTOTAL, CKMB, CKMBINDEX, TROPONINI in the last 168 hours.  CBG:  Recent Labs Lab 11/20/15 0643 11/20/15 1144 11/20/15 1701 11/20/15 2016 11/21/15 0558  GLUCAP 191* 209* 227* 228* 175*    Recent Results (from the past 240 hour(s))  Culture, blood (routine x 2)     Status: None   Collection Time: 11/11/15  9:45 PM  Result Value Ref Range Status   Specimen Description BLOOD RIGHT ANTECUBITAL  Final   Special Requests BOTTLES DRAWN AEROBIC AND ANAEROBIC  Final   Culture   Final    NO GROWTH 5 DAYS Performed at [facility]    Report Status 11/16/2015 FINAL  Final  Urine culture     Status: None   Collection Time: 11/12/15 12:28 AM  Result Value Ref Range Status   Specimen Description URINE, RANDOM  Final   Special Requests NONE  Final   Culture   Final    NO GROWTH 1 DAY Performed at [facility]    Report Status 11/13/2015 FINAL  Final  MRSA PCR Screening     Status: None   Collection Time: 11/12/15  2:56 AM  Result Value Ref Range Status   MRSA by PCR NEGATIVE NEGATIVE Final    Comment:        The GeneXpert MRSA Assay (FDA approved for NASAL specimens only), is one component of a comprehensive MRSA colonization surveillance program. It is not intended to diagnose MRSA infection nor to guide or monitor treatment for MRSA infections.   Culture, sputum-assessment     Status: None   Collection Time: 11/14/15  1:40 AM  Result Value Ref Range Status   Specimen Description EXPECTORATED SPUTUM  Final   Special Requests NONE  Final   Sputum evaluation   Final    MICROSCOPIC FINDINGS SUGGEST THAT THIS SPECIMEN IS NOT REPRESENTATIVE OF LOWER RESPIRATORY SECRETIONS. PLEASE RECOLLECT. Gram Stain Report Called to,Read Back By and Verified With: C BENGE  11/14/15 MKELLY    Report Status  11/14/2015 FINAL  Final  Culture, respiratory (NON-Expectorated)     Status: None   Collection Time: 11/15/15  1:24 PM  Result Value Ref Range Status   Specimen Description TRACHEAL ASPIRATE  Final   Special Requests Normal  Final   Gram Stain   Final    ABUNDANT WBC FEW SQUAMOUS EPITHELIAL CELLS PRESENT FEW YEAST Performed at [facility]    Culture   Final    ABUNDANT YEAST CONSISTENT WITH CANDIDA SPECIES Performed at [facility]    Report Status 11/18/2015 FINAL  Final  Culture, blood (Routine X 2) w Reflex to ID Panel     Status: None   Collection Time: 11/15/15  3:15 PM  Result Value Ref Range Status   Specimen Description BLOOD LEFT ANTECUBITAL  Final   Special Requests BOTTLES DRAWN AEROBIC AND ANAEROBIC 10CC  Final   Culture NO GROWTH 5 DAYS  Final   Report Status 11/20/2015 FINAL  Final  Culture, blood (Routine X 2) w Reflex to ID Panel     Status: None   Collection Time: 11/15/15  3:30 PM  Result Value Ref Range Status   Specimen Description BLOOD BLOOD LEFT HAND  Final   Special Requests BOTTLES DRAWN AEROBIC AND ANAEROBIC 10CC  Final   Culture NO GROWTH 5 DAYS  Final   Report Status 11/20/2015 FINAL  Final       Scheduled Meds:  Scheduled Meds: . amLODipine  5 mg Oral Daily  . antiseptic oral rinse  7 mL Mouth Rinse q12n4p  . aspirin EC  81 mg Oral Daily  . atenolol  50 mg Oral Daily  . chlorhexidine  15 mL Mouth Rinse BID  . clopidogrel  75 mg Oral Daily  . enoxaparin (LOVENOX) injection  40 mg Subcutaneous Q24H  . furosemide  60 mg Intravenous BID  . gabapentin  300 mg Oral BID  . insulin aspart  0-9 Units Subcutaneous TID WC  . insulin glargine  20 Units Subcutaneous QHS  . isosorbide mononitrate  30 mg Oral Daily  . pantoprazole  40 mg Oral Daily  . ranolazine  1,000 mg Oral BID  . sodium chloride  3 mL Intravenous Q12H    Time spent on care of this patient: 25 mins   Makalynn Berwanger Juanetta Gosling , DO   [facility] Office   [phone] Pager - Text Page per Loretha Stapler as per below:  On-Call/Text Page:      [email or web address]      password [email or web address]  If 7PM-7AM, please contact night-coverage [email or web address] Password [email or web address] 11/21/2015, 11:47 AM   LOS: 9 days

## 2015-11-22 ENCOUNTER — Inpatient Hospital Stay (HOSPITAL_COMMUNITY): Payer: Medicare Other

## 2015-11-22 LAB — BASIC METABOLIC PANEL
ANION GAP: 10 (ref 5–15)
BUN: 45 mg/dL — ABNORMAL HIGH (ref 6–20)
CALCIUM: 8.3 mg/dL — AB (ref 8.9–10.3)
CHLORIDE: 101 mmol/L (ref 101–111)
CO2: 31 mmol/L (ref 22–32)
Creatinine, Ser: 1.63 mg/dL — ABNORMAL HIGH (ref 0.61–1.24)
GFR calc Af Amer: 43 mL/min — ABNORMAL LOW (ref >60)
GFR calc non Af Amer: 37 mL/min — ABNORMAL LOW (ref >60)
GLUCOSE: 162 mg/dL — AB (ref 65–99)
POTASSIUM: 4 mmol/L (ref 3.5–5.1)
Sodium: 142 mmol/L (ref 135–145)

## 2015-11-22 LAB — CBC
HEMATOCRIT: 28.6 % — AB (ref 39.0–52.0)
HEMOGLOBIN: 9.3 g/dL — AB (ref 13.0–17.0)
MCH: 32.1 pg (ref 26.0–34.0)
MCHC: 32.5 g/dL (ref 30.0–36.0)
MCV: 98.6 fL (ref 78.0–100.0)
Platelets: 328 10*3/uL (ref 150–400)
RBC: 2.9 MIL/uL — ABNORMAL LOW (ref 4.22–5.81)
RDW: 14.3 % (ref 11.5–15.5)
WBC: 10.3 10*3/uL (ref 4.0–10.5)

## 2015-11-22 LAB — GLUCOSE, CAPILLARY
GLUCOSE-CAPILLARY: 147 mg/dL — AB (ref 65–99)
GLUCOSE-CAPILLARY: 235 mg/dL — AB (ref 65–99)
Glucose-Capillary: 257 mg/dL — ABNORMAL HIGH (ref 65–99)
Glucose-Capillary: 176 mg/dL — ABNORMAL HIGH (ref 65–99)

## 2015-11-22 MED ORDER — ALBUTEROL SULFATE (2.5 MG/3ML) 0.083% IN NEBU
2.5000 mg | INHALATION_SOLUTION | Freq: Three times a day (TID) | RESPIRATORY_TRACT | Status: DC
  Administered 2015-11-23 – 2015-11-24 (×5): 2.5 mg via RESPIRATORY_TRACT
  Filled 2015-11-22 (×5): qty 3

## 2015-11-22 MED ORDER — POLYETHYLENE GLYCOL 3350 17 G PO PACK
17.0000 g | PACK | Freq: Every day | ORAL | Status: DC
  Administered 2015-11-23 – 2015-11-24 (×2): 17 g via ORAL
  Filled 2015-11-22 (×2): qty 1

## 2015-11-22 NOTE — Progress Notes (Signed)
PROGRESS NOTE  Jeralyn BennettRichard J Zaro RUE:[id number] DOB: 04/16/1928 DOA: 11/11/2015 PCP: Gwen PoundsUSSO,JOHN M, MD  Admit HPI / Brief Narrative: 81 y.o. male with a past medical history significant for CHF, CAD s/p CABG, HTN, and IDDM who presented with fever, confusion, and cough.  The patient had increasing dyspnea on exertion and breathing difficulty for a month. He went to his PCP who prescribed home oxygen 2 L by nasal cannula. The patient then developed a fever and seemed more confused than usual, so his wife brought him to the ER.  In the ED, the patient had a low-grade fever to 100 F, tachycardia, tachypnea, hypertension, and hypoxia requiring 4 L of supplemental oxygen. His renal function was normal but he had an elevated BUN to creatinine ratio. The lactic acid level was normal. He had an high normal WBC. His troponin was 2.2 ng per mL, and BNP was moderately elevated. Chest x-ray showed pulmonary edema. An ECG showed no ST elevations.  Since admit: Admitted on 12/21 with NSTEMI, had a cath 12/22, no interventions made. Developed HCAP, intubated 12/24.  Extubated 12/26.  Severe wheezing and respiratory distress on 12/29-- lasix given due to elevated BNP.  Cardiology re-consulted  Significant Events: 12/21 NSTEMI 12/22 Cardiac cath - diffuse dx 12/24 intubated  HPI/Subjective: breathing better but hip hurts a lot  When he cough  Assessment/Plan:  L lung HCAP - Acute respiratory failure with hypoxemia  completed a 7 day course of abx tx - WBC has normalized - afebrile - still requiring 4L [place] oxygen support - attempt to wean O2  -duonebs  NSTEMI - known hx of CAD s/p CABG Diffuse dx by cath 12/22 (Only LIMA patent. SVGs occluded) - Cards rec med management w/ DAPT  -appears volume overloaded: lasix 60 mg BID - down 2L  -repeat x ray  Right hip pain -check hip x ray  CKD stage III Baseline crt appears to be ~1.2 -  recheck in AM with diuresis  Hypotension Resolved  Hx  HTN follow  DM CBG variable   GERD  Code Status: FULL Family Communication: wife at bedside 12/31 Disposition Plan: SNf once breathing status improved  [facility]   Antibiotics: 12/22 fortaz > 12/28 12/22 vanc > 12/26  DVT prophylaxis: lovenox  Objective: Blood pressure 130/44, pulse 68, temperature 97.9 F (36.6 C), temperature source Oral, resp. rate 18, height 5\' 9"  (1.753 m), weight 83.3 kg (183 lb 10.3 oz), SpO2 89 %.  Intake/Output Summary (Last 24 hours) at 11/22/15 1319 Last data filed at 11/22/15 1100  Gross per 24 hour  Intake    483 ml  Output   1825 ml  Net  -1342 ml   Exam: General: breathing easier, still on 4L Lungs: moving more air Cardiovascular: Regular rate and rhythm- did not hear murmur Abdomen: Nontender, nondistended, soft, bowel sounds positive, no rebound, no ascites, no appreciable mass Extremities: No significant cyanosis, clubbing, or edema bilateral lower extremities  Data Reviewed: Basic Metabolic Panel:  Recent Labs Lab 11/17/15 0408 11/18/15 0239 11/19/15 0356 11/20/15 0322 11/21/15 0242 11/22/15 0324  NA 140 139 140 139 140 142  K 3.9 4.1 4.2 4.9 4.3 4.0  CL 103 106 105 102 102 101  CO2 27 25 26 28 29 31   GLUCOSE 150* 185* 126* 240* 180* 162*  BUN 66* 65* 54* 46* 43* 45*  CREATININE 2.42* 1.88* 1.53* 1.56* 1.66* 1.63*  CALCIUM 7.8* 7.8* 8.2* 8.3* 8.2* 8.3*  MG 1.9  --   --   --   --   --  PHOS 3.5  --   --   --   --   --     CBC:  Recent Labs Lab 11/18/15 0239 11/19/15 0356 11/20/15 0322 11/21/15 0242 11/22/15 0324  WBC 6.7 7.7 10.1 10.3 10.3  NEUTROABS 4.8  --   --   --   --   HGB 10.2* 9.8* 9.9* 9.3* 9.3*  HCT 30.0* 29.6* 29.5* 28.3* 28.6*  MCV 98.7 98.3 99.3 99.3 98.6  PLT 164 211 267 299 328   Coags: No results for input(s): INR in the last 168 hours.  Invalid input(s): PT Cardiac Enzymes: No results for input(s): CKTOTAL, CKMB, CKMBINDEX, TROPONINI in the last 168  hours.  CBG:  Recent Labs Lab 11/21/15 1120 11/21/15 1653 11/21/15 2133 11/22/15 0644 11/22/15 1145  GLUCAP 236* 183* 177* 147* 235*    Recent Results (from the past 240 hour(s))  Culture, sputum-assessment     Status: None   Collection Time: 11/14/15  1:40 AM  Result Value Ref Range Status   Specimen Description EXPECTORATED SPUTUM  Final   Special Requests NONE  Final   Sputum evaluation   Final    MICROSCOPIC FINDINGS SUGGEST THAT THIS SPECIMEN IS NOT REPRESENTATIVE OF LOWER RESPIRATORY SECRETIONS. PLEASE RECOLLECT. Gram Stain Report Called to,Read Back By and Verified With: C BENGE  11/14/15 MKELLY    Report Status 11/14/2015 FINAL  Final  Culture, respiratory (NON-Expectorated)     Status: None   Collection Time: 11/15/15  1:24 PM  Result Value Ref Range Status   Specimen Description TRACHEAL ASPIRATE  Final   Special Requests Normal  Final   Gram Stain   Final    ABUNDANT WBC FEW SQUAMOUS EPITHELIAL CELLS PRESENT FEW YEAST Performed at [facility]    Culture   Final    ABUNDANT YEAST CONSISTENT WITH CANDIDA SPECIES Performed at [facility]    Report Status 11/18/2015 FINAL  Final  Culture, blood (Routine X 2) w Reflex to ID Panel     Status: None   Collection Time: 11/15/15  3:15 PM  Result Value Ref Range Status   Specimen Description BLOOD LEFT ANTECUBITAL  Final   Special Requests BOTTLES DRAWN AEROBIC AND ANAEROBIC 10CC  Final   Culture NO GROWTH 5 DAYS  Final   Report Status 11/20/2015 FINAL  Final  Culture, blood (Routine X 2) w Reflex to ID Panel     Status: None   Collection Time: 11/15/15  3:30 PM  Result Value Ref Range Status   Specimen Description BLOOD BLOOD LEFT HAND  Final   Special Requests BOTTLES DRAWN AEROBIC AND ANAEROBIC 10CC  Final   Culture NO GROWTH 5 DAYS  Final   Report Status 11/20/2015 FINAL  Final       Scheduled Meds:  Scheduled Meds: . albuterol  2.5 mg Nebulization Q6H  . amLODipine  5 mg Oral  Daily  . antiseptic oral rinse  7 mL Mouth Rinse q12n4p  . aspirin EC  81 mg Oral Daily  . atenolol  50 mg Oral Daily  . chlorhexidine  15 mL Mouth Rinse BID  . clopidogrel  75 mg Oral Daily  . enoxaparin (LOVENOX) injection  40 mg Subcutaneous Q24H  . furosemide  60 mg Intravenous BID  . gabapentin  300 mg Oral BID  . insulin aspart  0-9 Units Subcutaneous TID WC  . insulin glargine  20 Units Subcutaneous QHS  . isosorbide mononitrate  30 mg Oral Daily  . pantoprazole  40 mg Oral Daily  . ranolazine  1,000 mg Oral BID  . sodium chloride  3 mL Intravenous Q12H    Time spent on care of this patient: 25 mins   Samora Jernberg Juanetta Gosling , DO   [facility]  [phone] Pager - Text Page per Loretha Stapler as per below:  On-Call/Text Page:      [email or web address]      password [email or web address]  If 7PM-7AM, please contact night-coverage [email or web address] Password [email or web address] 11/22/2015, 1:19 PM   LOS: 10 days

## 2015-11-22 NOTE — Progress Notes (Signed)
Primary cardiologist: Dr. Kristeen Miss  Seen for followup: NSTEMI, CAD  Subjective:    Continues to cough intermittently. Complains of right hip pain. Has not able to ambulate to any significant degree other than getting into his bedside chair with assistance. No obvious angina.  Objective:   Temp:  [97.9 F (36.6 C)-98.2 F (36.8 C)] 97.9 F (36.6 C) (12/31 0500) Pulse Rate:  [57-68] 68 (12/31 0900) Resp:  [18-28] 18 (12/31 0500) BP: (121-130)/(41-44) 130/44 mmHg (12/31 0500) SpO2:  [89 %-96 %] 89 % (12/31 0744) Weight:  [183 lb 10.3 oz (83.3 kg)] 183 lb 10.3 oz (83.3 kg) (12/31 0500) Last BM Date: 11/18/15  Filed Weights   11/20/15 0335 11/21/15 0600 11/22/15 0500  Weight: 198 lb (89.812 kg) 194 lb 14.2 oz (88.4 kg) 183 lb 10.3 oz (83.3 kg)    Intake/Output Summary (Last 24 hours) at 11/22/15 1020 Last data filed at 11/22/15 0500  Gross per 24 hour  Intake    363 ml  Output   1325 ml  Net   -962 ml    Telemetry: Normal sinus rhythm.  Exam:  General: Elderly male, mild short of breath at rest with coughing.  Lungs: Coarse breath sounds with scattered rhonchi and expiratory wheeze.  Cardiac: RRR with soft systolic murmur and no gallop.  Abdomen: NABS. Nontender.  Extremities: No pitting edema.  Lab Results:  Basic Metabolic Panel:  Recent Labs Lab 11/17/15 0408  11/20/15 0322 11/21/15 0242 11/22/15 0324  NA 140  < > 139 140 142  K 3.9  < > 4.9 4.3 4.0  CL 103  < > 102 102 101  CO2 27  < > GLUCOSE 150*  < > 240* 180* 162*  BUN 66*  < > 46* 43* 45*  CREATININE 2.42*  < > 1.56* 1.66* 1.63*  CALCIUM 7.8*  < > 8.3* 8.2* 8.3*  MG 1.9  --   --   --   --   < > = values in this interval not displayed.  Liver Function Tests:  Recent Labs Lab 11/20/15 0322  AST 65*  ALT 43  ALKPHOS 50  BILITOT 1.2  PROT 5.9*  ALBUMIN 2.2*    CBC:  Recent Labs Lab 11/20/15 0322 11/21/15 0242 11/22/15 0324  WBC 10.1 10.3 10.3  HGB 9.9* 9.3*  9.3*  HCT 29.5* 28.3* 28.6*  MCV 99.3 99.3 98.6  PLT 267 299 328    Cardiac catheterization 11/13/2015:  Ost LM to LM lesion, 20% stenosed.  LM lesion, 100% stenosed.  Mid Cx lesion, 90% stenosed.  Ost 2nd Mrg lesion, 100% stenosed.  Ost 3rd Mrg lesion, 100% stenosed.  Dist Cx lesion, 100% stenosed.  Dist RCA lesion, 75% stenosed.  Mid RCA lesion, 60% stenosed.  SVG was injected is normal in caliber.  There is mild disease in the graft.  Dist Graft lesion, 20% stenosed.  LIMA was injected is normal in caliber, and is anatomically normal.  SVG .  Origin lesion, before 2nd Mrg, 100% stenosed.  SVG was injected .  There is severe disease in the graft.  Origin lesion, 100% stenosed.  Acute Mrg lesion, 50% stenosed.  Significant multivessel native CAD with 40% distal left main which seems to bifurcate into a ramus intermediate vessel and left circumflex vessel.   The LAD appears occluded proximally.   The ramus intermediate vessel is normal in appearance and collateralizes to distal circumflex marginal branches. The left circumflex vessel has a  90% focal stenosis in the mid AV groove after the first marginal branch in both marginal branches are occluded antegrade, but collateralization is present. The distal AV groove circumflex is occluded.  RCA with 60 followed by 75% mid stenosis and evidence for 50% acute marginal stenosis which is a large-caliber vessel which distally collateralizes the circumflex marginal vessel.  Patent vein graft supplying a very proximal diagonal vessel which then fills a septal perforating artery which collateralizes the distal circumflex. There is a smooth 20% distal third narrowing in the body of this graft.  Occluded vein graft, which previously was a sequential graft supplying the OM 2 and on 3 vessel.  Occluded vein graft, which had supplied the PDA.  Patent LIMA graft that supplies the mid LAD. The distal LAD also seems to  collateralizes the distal AV groove circumflex.  Echocardiogram 11/12/2015: Study Conclusions  - Left ventricle: The cavity size was normal. Systolic function was normal. The estimated ejection fraction was in the range of 50% to 55%. Hypokinesis of the inferolateral myocardium. The study is not technically sufficient to allow evaluation of LV diastolic function. - Mitral valve: Calcified annulus. Mildly thickened leaflets . - Left atrium: The atrium was mildly dilated.  Impressions:  - Limited study, without complete Doppler analysis and without subcostal views.   Medications:   Scheduled Medications: . albuterol  2.5 mg Nebulization Q6H  . amLODipine  5 mg Oral Daily  . antiseptic oral rinse  7 mL Mouth Rinse q12n4p  . aspirin EC  81 mg Oral Daily  . atenolol  50 mg Oral Daily  . chlorhexidine  15 mL Mouth Rinse BID  . clopidogrel  75 mg Oral Daily  . enoxaparin (LOVENOX) injection  40 mg Subcutaneous Q24H  . furosemide  60 mg Intravenous BID  . gabapentin  300 mg Oral BID  . insulin aspart  0-9 Units Subcutaneous TID WC  . insulin glargine  20 Units Subcutaneous QHS  . isosorbide mononitrate  30 mg Oral Daily  . pantoprazole  40 mg Oral Daily  . ranolazine  1,000 mg Oral BID  . sodium chloride  3 mL Intravenous Q12H     PRN Medications:  acetaminophen, benzonatate, bisacodyl, ipratropium-albuterol, nitroGLYCERIN, ondansetron (ZOFRAN) IV, traMADol, zolpidem   Assessment:   1. NSTEMI, peak troponin I to 25. No active angina at this time on medical therapy.  2. Two-vessel CAD status post CABG with recent cardiac catheterization demonstrating both native vessel and graft disease felt to be best managed medically. LVEF 50-55% by recent echocardiogram.  3. Community acquired pneumonia with hypoxic respiratory failure.  4. Acute diastolic heart failure, currently on diuretics with diuresis of about 2000 cc in the last 48 hours.  5. CKD, stage 3, recent  creatinine 1.6.   Plan/Discussion:    Discussed with patient and wife. Will continue current cardiac regimen including IV Lasix for now. Consider repeat chest x-ray. SNF is planned, and the patient will need further PT, very weak at this time. Follow-up ECG a.m.   Jonelle SidleSamuel G. Tanasha Menees, M.D., F.A.C.C.

## 2015-11-23 LAB — GLUCOSE, CAPILLARY
GLUCOSE-CAPILLARY: 271 mg/dL — AB (ref 65–99)
GLUCOSE-CAPILLARY: 301 mg/dL — AB (ref 65–99)
GLUCOSE-CAPILLARY: 380 mg/dL — AB (ref 65–99)
GLUCOSE-CAPILLARY: 384 mg/dL — AB (ref 65–99)
Glucose-Capillary: 166 mg/dL — ABNORMAL HIGH (ref 65–99)
Glucose-Capillary: 437 mg/dL — ABNORMAL HIGH (ref 65–99)

## 2015-11-23 LAB — CBC
HEMATOCRIT: 27.2 % — AB (ref 39.0–52.0)
HEMOGLOBIN: 9.1 g/dL — AB (ref 13.0–17.0)
MCH: 33.1 pg (ref 26.0–34.0)
MCHC: 33.5 g/dL (ref 30.0–36.0)
MCV: 98.9 fL (ref 78.0–100.0)
Platelets: 350 10*3/uL (ref 150–400)
RBC: 2.75 MIL/uL — ABNORMAL LOW (ref 4.22–5.81)
RDW: 14.2 % (ref 11.5–15.5)
WBC: 8.9 10*3/uL (ref 4.0–10.5)

## 2015-11-23 LAB — BASIC METABOLIC PANEL
ANION GAP: 8 (ref 5–15)
BUN: 50 mg/dL — ABNORMAL HIGH (ref 6–20)
CALCIUM: 8.5 mg/dL — AB (ref 8.9–10.3)
CHLORIDE: 98 mmol/L — AB (ref 101–111)
CO2: 33 mmol/L — AB (ref 22–32)
Creatinine, Ser: 1.85 mg/dL — ABNORMAL HIGH (ref 0.61–1.24)
GFR calc non Af Amer: 32 mL/min — ABNORMAL LOW (ref >60)
GFR, EST AFRICAN AMERICAN: 37 mL/min — AB (ref >60)
GLUCOSE: 154 mg/dL — AB (ref 65–99)
POTASSIUM: 3.9 mmol/L (ref 3.5–5.1)
Sodium: 139 mmol/L (ref 135–145)

## 2015-11-23 MED ORDER — METHYLPREDNISOLONE SODIUM SUCC 125 MG IJ SOLR
60.0000 mg | Freq: Four times a day (QID) | INTRAMUSCULAR | Status: DC
  Administered 2015-11-23 – 2015-11-24 (×4): 60 mg via INTRAVENOUS
  Filled 2015-11-23 (×4): qty 2

## 2015-11-23 MED ORDER — BENZONATATE 100 MG PO CAPS
100.0000 mg | ORAL_CAPSULE | Freq: Three times a day (TID) | ORAL | Status: DC
  Administered 2015-11-23 – 2015-11-24 (×4): 100 mg via ORAL
  Filled 2015-11-23 (×4): qty 1

## 2015-11-23 MED ORDER — INSULIN ASPART 100 UNIT/ML ~~LOC~~ SOLN
0.0000 [IU] | Freq: Every day | SUBCUTANEOUS | Status: DC
  Administered 2015-11-23: 5 [IU] via SUBCUTANEOUS

## 2015-11-23 MED ORDER — DEXTROMETHORPHAN POLISTIREX ER 30 MG/5ML PO SUER
15.0000 mg | Freq: Two times a day (BID) | ORAL | Status: DC
  Administered 2015-11-23 – 2015-11-24 (×3): 15 mg via ORAL
  Filled 2015-11-23 (×4): qty 5

## 2015-11-23 MED ORDER — NYSTATIN 100000 UNIT/ML MT SUSP
5.0000 mL | Freq: Four times a day (QID) | OROMUCOSAL | Status: DC
  Administered 2015-11-23 – 2015-11-24 (×3): 500000 [IU] via ORAL
  Filled 2015-11-23 (×2): qty 5

## 2015-11-23 MED ORDER — INSULIN ASPART 100 UNIT/ML ~~LOC~~ SOLN
0.0000 [IU] | Freq: Three times a day (TID) | SUBCUTANEOUS | Status: DC
  Administered 2015-11-24 (×2): 9 [IU] via SUBCUTANEOUS

## 2015-11-23 NOTE — Progress Notes (Signed)
Primary cardiologist: Dr. Kristeen Miss  Seen for followup: NSTEMI, CAD  Subjective:    Coughing this AM and congested. Has not able to ambulate to any significant degree other than getting into his bedside chair with assistance. No active angina.  Objective:   Temp:  [97.9 F (36.6 C)-98.4 F (36.9 C)] 98.4 F (36.9 C) (01/01 0456) Pulse Rate:  [62-64] 64 (01/01 0456) Resp:  [17-24] 20 (01/01 0456) BP: (112-141)/(43-50) 141/43 mmHg (01/01 0456) SpO2:  [93 %-95 %] 93 % (01/01 0903) Weight:  [194 lb 3.6 oz (88.1 kg)] 194 lb 3.6 oz (88.1 kg) (01/01 0456) Last BM Date: 11/18/15  Filed Weights   11/21/15 0600 11/22/15 0500 11/23/15 0456  Weight: 194 lb 14.2 oz (88.4 kg) 183 lb 10.3 oz (83.3 kg) 194 lb 3.6 oz (88.1 kg)    Intake/Output Summary (Last 24 hours) at 11/23/15 1025 Last data filed at 11/22/15 2033  Gross per 24 hour  Intake    480 ml  Output   1100 ml  Net   -620 ml    Telemetry: Normal sinus rhythm.  Exam:  General: Elderly male, mild short of breath at rest with coughing.  Lungs: Coarse breath sounds with scattered rhonchi and expiratory wheeze. Coughing.  Cardiac: RRR with soft systolic murmur and no gallop.  Abdomen: NABS. Nontender.  Extremities: No pitting edema.  Lab Results:  Basic Metabolic Panel:  Recent Labs Lab 11/17/15 0408  11/21/15 0242 11/22/15 0324 11/23/15 0443  NA 140  < > 140 142 139  K 3.9  < > 4.3 4.0 3.9  CL 103  < > 102 101 98*  CO2 27  < > 29 31 33*  GLUCOSE 150*  < > 180* 162* 154*  BUN 66*  < > 43* 45* 50*  CREATININE 2.42*  < > 1.66* 1.63* 1.85*  CALCIUM 7.8*  < > 8.2* 8.3* 8.5*  MG 1.9  --   --   --   --   < > = values in this interval not displayed.  Liver Function Tests:  Recent Labs Lab 11/20/15 0322  AST 65*  ALT 43  ALKPHOS 50  BILITOT 1.2  PROT 5.9*  ALBUMIN 2.2*    CBC:  Recent Labs Lab 11/21/15 0242 11/22/15 0324 11/23/15 0443  WBC 10.3 10.3 8.9  HGB 9.3* 9.3* 9.1*  HCT 28.3*  28.6* 27.2*  MCV 99.3 98.6 98.9  PLT 299 328 350    Cardiac catheterization 11/13/2015:  Ost LM to LM lesion, 20% stenosed.  LM lesion, 100% stenosed.  Mid Cx lesion, 90% stenosed.  Ost 2nd Mrg lesion, 100% stenosed.  Ost 3rd Mrg lesion, 100% stenosed.  Dist Cx lesion, 100% stenosed.  Dist RCA lesion, 75% stenosed.  Mid RCA lesion, 60% stenosed.  SVG was injected is normal in caliber.  There is mild disease in the graft.  Dist Graft lesion, 20% stenosed.  LIMA was injected is normal in caliber, and is anatomically normal.  SVG .  Origin lesion, before 2nd Mrg, 100% stenosed.  SVG was injected .  There is severe disease in the graft.  Origin lesion, 100% stenosed.  Acute Mrg lesion, 50% stenosed.  Significant multivessel native CAD with 40% distal left main which seems to bifurcate into a ramus intermediate vessel and left circumflex vessel.   The LAD appears occluded proximally.   The ramus intermediate vessel is normal in appearance and collateralizes to distal circumflex marginal branches. The left circumflex vessel has a 90%  focal stenosis in the mid AV groove after the first marginal branch in both marginal branches are occluded antegrade, but collateralization is present. The distal AV groove circumflex is occluded.  RCA with 60 followed by 75% mid stenosis and evidence for 50% acute marginal stenosis which is a large-caliber vessel which distally collateralizes the circumflex marginal vessel.  Patent vein graft supplying a very proximal diagonal vessel which then fills a septal perforating artery which collateralizes the distal circumflex. There is a smooth 20% distal third narrowing in the body of this graft.  Occluded vein graft, which previously was a sequential graft supplying the OM 2 and on 3 vessel.  Occluded vein graft, which had supplied the PDA.  Patent LIMA graft that supplies the mid LAD. The distal LAD also seems to collateralizes the  distal AV groove circumflex.  Echocardiogram 11/12/2015: Study Conclusions  - Left ventricle: The cavity size was normal. Systolic function was normal. The estimated ejection fraction was in the range of 50% to 55%. Hypokinesis of the inferolateral myocardium. The study is not technically sufficient to allow evaluation of LV diastolic function. - Mitral valve: Calcified annulus. Mildly thickened leaflets . - Left atrium: The atrium was mildly dilated.  Impressions:  - Limited study, without complete Doppler analysis and without subcostal views.  Chest x-ray 11/22/2015: FINDINGS: Midline trachea. Cardiomegaly accentuated by AP portable technique. Prior median sternotomy. Moderate right hemidiaphragm elevation. No pleural effusion or pneumothorax. Improved right-sided aeration. Interstitial and airspace disease throughout the left lung is similar.  IMPRESSION: Improved right and similar left-sided aeration. Left-sided interstitial and airspace disease is suspicious for infection. Asymmetric pulmonary edema felt less likely.  Similar moderate right hemidiaphragm elevation.  ECG 11/23/2015: Sinus rhythm with decreased R wave progression, nonspecific ST changes, recent inferior infarct pattern.  Medications:   Scheduled Medications: . albuterol  2.5 mg Nebulization TID  . amLODipine  5 mg Oral Daily  . antiseptic oral rinse  7 mL Mouth Rinse q12n4p  . aspirin EC  81 mg Oral Daily  . atenolol  50 mg Oral Daily  . chlorhexidine  15 mL Mouth Rinse BID  . clopidogrel  75 mg Oral Daily  . enoxaparin (LOVENOX) injection  40 mg Subcutaneous Q24H  . gabapentin  300 mg Oral BID  . insulin aspart  0-9 Units Subcutaneous TID WC  . insulin glargine  20 Units Subcutaneous QHS  . isosorbide mononitrate  30 mg Oral Daily  . pantoprazole  40 mg Oral Daily  . polyethylene glycol  17 g Oral Daily  . ranolazine  1,000 mg Oral BID  . sodium chloride  3 mL Intravenous Q12H     PRN Medications: acetaminophen, benzonatate, bisacodyl, ipratropium-albuterol, nitroGLYCERIN, ondansetron (ZOFRAN) IV, traMADol, zolpidem   Assessment:   1. NSTEMI, peak troponin I to 25. No active angina at this time on medical therapy.  2. Two-vessel CAD status post CABG with recent cardiac catheterization demonstrating both native vessel and graft disease felt to be best managed medically. LVEF 50-55% by recent echocardiogram.  3. Community acquired pneumonia with hypoxic respiratory failure. Followup CXR with residual changes from recent pneumonia and improved aeration.  4. Acute diastolic heart failure, has had reasonable diuresis, Lasix held with rising creatinine..  5. CKD, stage 3, recent creatinine 1.8.   Plan/Discussion:    Discussed with patient and wife. Will continue current cardiac regimen except holding Lasix for now. Steroids being added by primary team, antibiotics have been completed. SNF is planned, and the patient will need  further PT, very weak at this time. Follow-up ECG a.m.   Jonelle SidleSamuel G. McDowell, M.D., F.A.C.C.

## 2015-11-23 NOTE — Progress Notes (Signed)
PROGRESS NOTE  Jeralyn BennettRichard J Scalise WUJ:[id number] DOB: [birth year] DOA: 11/11/2015 PCP: Gwen PoundsUSSO,JOHN M, MD  Admit HPI / Brief Narrative: 82 y.o. male with a past medical history significant for CHF, CAD s/p CABG, HTN, and IDDM who presented with fever, confusion, and cough.  The patient had increasing dyspnea on exertion and breathing difficulty for a month. He went to his PCP who prescribed home oxygen 2 L by nasal cannula. The patient then developed a fever and seemed more confused than usual, so his wife brought him to the ER.  In the ED, the patient had a low-grade fever to 100 F, tachycardia, tachypnea, hypertension, and hypoxia requiring 4 L of supplemental oxygen. His renal function was normal but he had an elevated BUN to creatinine ratio. The lactic acid level was normal. He had an high normal WBC. His troponin was 2.2 ng per mL, and BNP was moderately elevated. Chest x-ray showed pulmonary edema. An ECG showed no ST elevations.  Since admit: Admitted on 12/21 with NSTEMI, had a cath 12/22, no interventions made. Developed HCAP, intubated 12/24.  Extubated 12/26.  Severe wheezing and respiratory distress on 12/29-- lasix given due to elevated BNP.  Cardiology re-consulted  Significant Events: 12/21 NSTEMI 12/22 Cardiac cath - diffuse dx 12/24 intubated  HPI/Subjective: Still with bothersome cough  Assessment/Plan:  L lung HCAP - Acute respiratory failure with hypoxemia  completed a 7 day course of abx tx - WBC has normalized - afebrile - still requiring 4L [place] oxygen support - attempt to wean O2  -duonebs -dose of steroids dexayln -tessalon pearls scheduled  NSTEMI - known hx of CAD s/p CABG Diffuse dx by cath 12/22 (Only LIMA patent. SVGs occluded) - Cards rec med management w/ DAPT  -appears volume overloaded: lasix 60 mg BID- d/c'd 1/1 - down 2.l -x ray with some improvement-- residulal L side PNA will take 6 weeks to clear-- repeat to ensure clearing  Right hip pain -x  ray ok  CKD stage III Baseline crt appears to be ~1.2 -  recheck in AM with diuresis  Hypotension Resolved  Hx HTN follow  DM CBG variable   GERD  Code Status: FULL Family Communication: wife at bedside  Disposition Plan: SNf once breathing status improved- Monday?  [facility]   Antibiotics: 12/22 fortaz > 12/28 12/22 vanc > 12/26  DVT prophylaxis: lovenox  Objective: Blood pressure 141/43, pulse 64, temperature 98.4 F (36.9 C), temperature source Oral, resp. rate 20, height 5\' 9"  (1.753 m), weight 88.1 kg (194 lb 3.6 oz), SpO2 93 %.  Intake/Output Summary (Last 24 hours) at 11/23/15 1231 Last data filed at 11/22/15 2033  Gross per 24 hour  Intake    480 ml  Output    600 ml  Net   -120 ml   Exam: General: breathing easier, still on 4L Lungs: moving more air Cardiovascular: Regular rate and rhythm- did not hear murmur Abdomen: Nontender, nondistended, soft, bowel sounds positive, no rebound, no ascites, no appreciable mass Extremities: No significant cyanosis, clubbing, or edema bilateral lower extremities  Data Reviewed: Basic Metabolic Panel:  Recent Labs Lab 11/17/15 0408  11/19/15 0356 11/20/15 0322 11/21/15 0242 11/22/15 0324 11/23/15 0443  NA 140  < > 140 139 140 142 139  K 3.9  < > 4.2 4.9 4.3 4.0 3.9  CL 103  < > 105 102 102 101 98*  CO2 27  < > 26 28 29 31  33*  GLUCOSE 150*  < >  126* 240* 180* 162* 154*  BUN 66*  < > 54* 46* 43* 45* 50*  CREATININE 2.42*  < > 1.53* 1.56* 1.66* 1.63* 1.85*  CALCIUM 7.8*  < > 8.2* 8.3* 8.2* 8.3* 8.5*  MG 1.9  --   --   --   --   --   --   PHOS 3.5  --   --   --   --   --   --   < > = values in this interval not displayed.  CBC:  Recent Labs Lab 11/18/15 0239 11/19/15 0356 11/20/15 0322 11/21/15 0242 11/22/15 0324 11/23/15 0443  WBC 6.7 7.7 10.1 10.3 10.3 8.9  NEUTROABS 4.8  --   --   --   --   --   HGB 10.2* 9.8* 9.9* 9.3* 9.3* 9.1*  HCT 30.0* 29.6* 29.5* 28.3* 28.6*  27.2*  MCV 98.7 98.3 99.3 99.3 98.6 98.9  PLT 164 211 267 299 328 350   Coags: No results for input(s): INR in the last 168 hours.  Invalid input(s): PT Cardiac Enzymes: No results for input(s): CKTOTAL, CKMB, CKMBINDEX, TROPONINI in the last 168 hours.  CBG:  Recent Labs Lab 11/22/15 1145 11/22/15 1644 11/22/15 2113 11/23/15 0614 11/23/15 1200  GLUCAP 235* 257* 176* 166* 271*    Recent Results (from the past 240 hour(s))  Culture, sputum-assessment     Status: None   Collection Time: 11/14/15  1:40 AM  Result Value Ref Range Status   Specimen Description EXPECTORATED SPUTUM  Final   Special Requests NONE  Final   Sputum evaluation   Final    MICROSCOPIC FINDINGS SUGGEST THAT THIS SPECIMEN IS NOT REPRESENTATIVE OF LOWER RESPIRATORY SECRETIONS. PLEASE RECOLLECT. Gram Stain Report Called to,Read Back By and Verified With: C BENGE @0234  11/14/15 MKELLY    Report Status 11/14/2015 FINAL  Final  Culture, respiratory (NON-Expectorated)     Status: None   Collection Time: 11/15/15  1:24 PM  Result Value Ref Range Status   Specimen Description TRACHEAL ASPIRATE  Final   Special Requests Normal  Final   Gram Stain   Final    ABUNDANT WBC FEW SQUAMOUS EPITHELIAL CELLS PRESENT FEW YEAST Performed at [facility]    Culture   Final    ABUNDANT YEAST CONSISTENT WITH CANDIDA SPECIES Performed at [facility]    Report Status 11/18/2015 FINAL  Final  Culture, blood (Routine X 2) w Reflex to ID Panel     Status: None   Collection Time: 11/15/15  3:15 PM  Result Value Ref Range Status   Specimen Description BLOOD LEFT ANTECUBITAL  Final   Special Requests BOTTLES DRAWN AEROBIC AND ANAEROBIC 10CC  Final   Culture NO GROWTH 5 DAYS  Final   Report Status 11/20/2015 FINAL  Final  Culture, blood (Routine X 2) w Reflex to ID Panel     Status: None   Collection Time: 11/15/15  3:30 PM  Result Value Ref Range Status   Specimen Description BLOOD BLOOD LEFT HAND  Final    Special Requests BOTTLES DRAWN AEROBIC AND ANAEROBIC 10CC  Final   Culture NO GROWTH 5 DAYS  Final   Report Status 11/20/2015 FINAL  Final       Scheduled Meds:  Scheduled Meds: . albuterol  2.5 mg Nebulization TID  . amLODipine  5 mg Oral Daily  . antiseptic oral rinse  7 mL Mouth Rinse q12n4p  . aspirin EC  81 mg Oral Daily  .  atenolol  50 mg Oral Daily  . benzonatate  100 mg Oral TID  . chlorhexidine  15 mL Mouth Rinse BID  . clopidogrel  75 mg Oral Daily  . dextromethorphan  15 mg Oral BID  . enoxaparin (LOVENOX) injection  40 mg Subcutaneous Q24H  . gabapentin  300 mg Oral BID  . insulin aspart  0-9 Units Subcutaneous TID WC  . insulin glargine  20 Units Subcutaneous QHS  . isosorbide mononitrate  30 mg Oral Daily  . methylPREDNISolone (SOLU-MEDROL) injection  60 mg Intravenous Q6H  . pantoprazole  40 mg Oral Daily  . polyethylene glycol  17 g Oral Daily  . ranolazine  1,000 mg Oral BID  . sodium chloride  3 mL Intravenous Q12H    Time spent on care of this patient: 25 mins   Myrtle Haller Juanetta Gosling , DO   [facility] Office  [phone] Pager - Text Page per Loretha Stapler as per below:  On-Call/Text Page:      [email or web address]      password [email or web address]  If 7PM-7AM, please contact night-coverage [email or web address] Password [email or web address] 11/23/2015, 12:31 PM   LOS: 11 days

## 2015-11-24 LAB — GLUCOSE, CAPILLARY
GLUCOSE-CAPILLARY: 398 mg/dL — AB (ref 65–99)
GLUCOSE-CAPILLARY: 395 mg/dL — AB (ref 65–99)
GLUCOSE-CAPILLARY: 382 mg/dL — AB (ref 65–99)
Glucose-Capillary: 407 mg/dL — ABNORMAL HIGH (ref 65–99)
Glucose-Capillary: 358 mg/dL — ABNORMAL HIGH (ref 65–99)

## 2015-11-24 MED ORDER — BISACODYL 10 MG RE SUPP: 10.0000 mg | Freq: Every day | RECTAL | Status: AC | PRN

## 2015-11-24 MED ORDER — NYSTATIN 100000 UNIT/ML MT SUSP: 5.0000 mL | Freq: Four times a day (QID) | OROMUCOSAL | Status: AC

## 2015-11-24 MED ORDER — PREDNISONE 20 MG PO TABS
50.0000 mg | ORAL_TABLET | Freq: Every day | ORAL | Status: DC
  Administered 2015-11-24: 50 mg via ORAL
  Filled 2015-11-24: qty 2

## 2015-11-24 MED ORDER — COLCHICINE 0.6 MG PO TABS
0.6000 mg | ORAL_TABLET | Freq: Every day | ORAL | Status: DC
  Administered 2015-11-24: 0.6 mg via ORAL
  Filled 2015-11-24: qty 1

## 2015-11-24 MED ORDER — INSULIN ASPART 100 UNIT/ML ~~LOC~~ SOLN
10.0000 [IU] | Freq: Three times a day (TID) | SUBCUTANEOUS | Status: DC
  Administered 2015-11-24: 10 [IU] via SUBCUTANEOUS

## 2015-11-24 MED ORDER — DEXTROMETHORPHAN POLISTIREX ER 30 MG/5ML PO SUER: 15.0000 mg | Freq: Two times a day (BID) | ORAL | Status: DC

## 2015-11-24 MED ORDER — ZOLPIDEM TARTRATE 5 MG PO TABS: 5.0000 mg | ORAL_TABLET | Freq: Every evening | ORAL | Status: DC | PRN

## 2015-11-24 MED ORDER — TRAMADOL HCL 50 MG PO TABS: 50.0000 mg | ORAL_TABLET | Freq: Four times a day (QID) | ORAL | Status: AC | PRN

## 2015-11-24 MED ORDER — PREDNISONE 50 MG PO TABS: 50.0000 mg | ORAL_TABLET | Freq: Every day | ORAL | Status: DC

## 2015-11-24 MED ORDER — ALBUTEROL SULFATE (2.5 MG/3ML) 0.083% IN NEBU: 2.5000 mg | INHALATION_SOLUTION | RESPIRATORY_TRACT | Status: AC | PRN

## 2015-11-24 MED ORDER — INSULIN ASPART 100 UNIT/ML ~~LOC~~ SOLN
10.0000 [IU] | Freq: Once | SUBCUTANEOUS | Status: AC
  Administered 2015-11-24: 10 [IU] via SUBCUTANEOUS

## 2015-11-24 MED ORDER — COLCHICINE 0.6 MG PO TABS: 0.6000 mg | ORAL_TABLET | Freq: Every day | ORAL | Status: DC

## 2015-11-24 MED ORDER — INSULIN GLARGINE 100 UNIT/ML ~~LOC~~ SOLN
30.0000 [IU] | Freq: Every day | SUBCUTANEOUS | Status: DC
  Filled 2015-11-24: qty 0.3

## 2015-11-24 MED ORDER — POLYETHYLENE GLYCOL 3350 17 G PO PACK: 17.0000 g | PACK | Freq: Every day | ORAL | Status: AC

## 2015-11-24 MED ORDER — CLOPIDOGREL BISULFATE 75 MG PO TABS: 75.0000 mg | ORAL_TABLET | Freq: Every day | ORAL | Status: AC

## 2015-11-24 MED ORDER — INSULIN ASPART 100 UNIT/ML ~~LOC~~ SOLN: 10.0000 [IU] | Freq: Three times a day (TID) | SUBCUTANEOUS | Status: DC

## 2015-11-24 MED ORDER — IPRATROPIUM-ALBUTEROL 0.5-2.5 (3) MG/3ML IN SOLN: 3.0000 mL | RESPIRATORY_TRACT | Status: DC | PRN

## 2015-11-24 MED ORDER — INSULIN ASPART 100 UNIT/ML ~~LOC~~ SOLN: 0.0000 [IU] | Freq: Three times a day (TID) | SUBCUTANEOUS | Status: AC

## 2015-11-24 MED ORDER — BENZONATATE 100 MG PO CAPS: 100.0000 mg | ORAL_CAPSULE | Freq: Three times a day (TID) | ORAL | Status: DC

## 2015-11-24 MED ORDER — INSULIN ASPART 100 UNIT/ML ~~LOC~~ SOLN
8.0000 [IU] | Freq: Once | SUBCUTANEOUS | Status: AC
  Administered 2015-11-24: 8 [IU] via SUBCUTANEOUS

## 2015-11-24 MED ORDER — AMLODIPINE BESYLATE 5 MG PO TABS: 5.0000 mg | ORAL_TABLET | Freq: Every day | ORAL | Status: DC

## 2015-11-24 MED ORDER — INSULIN ASPART 100 UNIT/ML ~~LOC~~ SOLN: 0.0000 [IU] | Freq: Every day | SUBCUTANEOUS | Status: AC

## 2015-11-24 NOTE — Progress Notes (Signed)
Primary cardiologist: Dr. Kristeen MissPhilip Brean Barron  Seen for followup: NSTEMI, CAD  Subjective:    Still coughing. Still short of breath .  No active angina.  Objective:   Temp:  [97.7 F (36.5 C)-98.1 F (36.7 C)] 97.7 F (36.5 C) (01/02 0434) Pulse Rate:  [60-67] 61 (01/02 0434) Resp:  [16-20] 17 (01/02 0434) BP: (108-141)/(46-108) 117/46 mmHg (01/02 0434) SpO2:  [90 %-100 %] 90 % (01/02 0746) Weight:  [189 lb 13.1 oz (86.1 kg)] 189 lb 13.1 oz (86.1 kg) (01/02 0434) Last BM Date: 11/23/15  Filed Weights   11/22/15 0500 11/23/15 0456 11/24/15 0434  Weight: 183 lb 10.3 oz (83.3 kg) 194 lb 3.6 oz (88.1 kg) 189 lb 13.1 oz (86.1 kg)    Intake/Output Summary (Last 24 hours) at 11/24/15 0835 Last data filed at 11/24/15 [id number]  Gross per 24 hour  Intake    701 ml  Output   1100 ml  Net   -399 ml    Telemetry: Normal sinus rhythm.  Exam:  General: Elderly male, mild short of breath at rest with coughing.  Lungs: Coarse breath sounds with scattered rhonchi and expiratory wheeze. Coughing.  Cardiac: RRR with soft systolic murmur and no gallop., ? Soft pericardial friction rub.   Abdomen: NABS. Nontender.  Extremities: No pitting edema.  Lab Results:  Basic Metabolic Panel:  Recent Labs Lab 11/21/15 0242 11/22/15 0324 11/23/15 0443  NA 140 142 139  K 4.3 4.0 3.9  CL 102 101 98*  CO2 29 31 33*  GLUCOSE 180* 162* 154*  BUN 43* 45* 50*  CREATININE 1.66* 1.63* 1.85*  CALCIUM 8.2* 8.3* 8.5*    Liver Function Tests:  Recent Labs Lab 11/20/15 0322  AST 65*  ALT 43  ALKPHOS 50  BILITOT 1.2  PROT 5.9*  ALBUMIN 2.2*    CBC:  Recent Labs Lab 11/21/15 0242 11/22/15 0324 11/23/15 0443  WBC 10.3 10.3 8.9  HGB 9.3* 9.3* 9.1*  HCT 28.3* 28.6* 27.2*  MCV 99.3 98.6 98.9  PLT 299 328 350    Cardiac catheterization 11/13/2015:  Ost LM to LM lesion, 20% stenosed.  LM lesion, 100% stenosed.  Mid Cx lesion, 90% stenosed.  Ost 2nd Mrg lesion, 100%  stenosed.  Ost 3rd Mrg lesion, 100% stenosed.  Dist Cx lesion, 100% stenosed.  Dist RCA lesion, 75% stenosed.  Mid RCA lesion, 60% stenosed.  SVG was injected is normal in caliber.  There is mild disease in the graft.  Dist Graft lesion, 20% stenosed.  LIMA was injected is normal in caliber, and is anatomically normal.  SVG .  Origin lesion, before 2nd Mrg, 100% stenosed.  SVG was injected .  There is severe disease in the graft.  Origin lesion, 100% stenosed.  Acute Mrg lesion, 50% stenosed.  Significant multivessel native CAD with 40% distal left main which seems to bifurcate into a ramus intermediate vessel and left circumflex vessel.   The LAD appears occluded proximally.   The ramus intermediate vessel is normal in appearance and collateralizes to distal circumflex marginal branches. The left circumflex vessel has a 90% focal stenosis in the mid AV groove after the first marginal branch in both marginal branches are occluded antegrade, but collateralization is present. The distal AV groove circumflex is occluded.  RCA with 60 followed by 75% mid stenosis and evidence for 50% acute marginal stenosis which is a large-caliber vessel which distally collateralizes the circumflex marginal vessel.  Patent vein graft supplying a very proximal diagonal  vessel which then fills a septal perforating artery which collateralizes the distal circumflex. There is a smooth 20% distal third narrowing in the body of this graft.  Occluded vein graft, which previously was a sequential graft supplying the OM 2 and on 3 vessel.  Occluded vein graft, which had supplied the PDA.  Patent LIMA graft that supplies the mid LAD. The distal LAD also seems to collateralizes the distal AV groove circumflex.  Echocardiogram 11/12/2015: Study Conclusions  - Left ventricle: The cavity size was normal. Systolic function was normal. The estimated ejection fraction was in the range of  50% to 55%. Hypokinesis of the inferolateral myocardium. The study is not technically sufficient to allow evaluation of LV diastolic function. - Mitral valve: Calcified annulus. Mildly thickened leaflets . - Left atrium: The atrium was mildly dilated.  Impressions:  - Limited study, without complete Doppler analysis and without subcostal views.  Chest x-ray 11/22/2015: FINDINGS: Midline trachea. Cardiomegaly accentuated by AP portable technique. Prior median sternotomy. Moderate right hemidiaphragm elevation. No pleural effusion or pneumothorax. Improved right-sided aeration. Interstitial and airspace disease throughout the left lung is similar.  IMPRESSION: Improved right and similar left-sided aeration. Left-sided interstitial and airspace disease is suspicious for infection. Asymmetric pulmonary edema felt less likely.  Similar moderate right hemidiaphragm elevation.  ECG 11/23/2015: Sinus rhythm with decreased R wave progression, nonspecific ST changes, recent inferior infarct pattern.  Medications:   Scheduled Medications: . albuterol  2.5 mg Nebulization TID  . amLODipine  5 mg Oral Daily  . antiseptic oral rinse  7 mL Mouth Rinse q12n4p  . aspirin EC  81 mg Oral Daily  . atenolol  50 mg Oral Daily  . benzonatate  100 mg Oral TID  . chlorhexidine  15 mL Mouth Rinse BID  . clopidogrel  75 mg Oral Daily  . dextromethorphan  15 mg Oral BID  . enoxaparin (LOVENOX) injection  40 mg Subcutaneous Q24H  . gabapentin  300 mg Oral BID  . insulin aspart  0-5 Units Subcutaneous QHS  . insulin aspart  0-9 Units Subcutaneous TID WC  . insulin glargine  20 Units Subcutaneous QHS  . isosorbide mononitrate  30 mg Oral Daily  . methylPREDNISolone (SOLU-MEDROL) injection  60 mg Intravenous Q6H  . nystatin  5 mL Oral QID  . pantoprazole  40 mg Oral Daily  . polyethylene glycol  17 g Oral Daily  . ranolazine  1,000 mg Oral BID  . sodium chloride  3 mL Intravenous Q12H     PRN Medications: acetaminophen, bisacodyl, ipratropium-albuterol, nitroGLYCERIN, ondansetron (ZOFRAN) IV, traMADol, zolpidem   Assessment:   1. NSTEMI, peak troponin I to 25. No active angina at this time on medical therapy. He may have a soft pericardial friction rub.  No symptoms of pericarditis. ? May be contributing to his cough. Will try low dose colchicine - 0.6 bid for several days   2. Two-vessel CAD status post CABG with recent cardiac catheterization demonstrating both native vessel and graft disease felt to be best managed medically. LVEF 50-55% by recent echocardiogram.  3. Community acquired pneumonia with hypoxic respiratory failure. Followup CXR with residual changes from recent pneumonia and improved aeration.  4. Acute diastolic heart failure, has had reasonable diuresis, Lasix held with rising creatinine..  5. CKD, stage 3, recent creatinine 1.8.    [name], Dylan Ping, MD  11/24/2015 8:39 AM    [facility] [address] [place],  [address] [place], [place]  [address] Pager [phone] Phone: [phone]; Fax: [phone]

## 2015-11-24 NOTE — Progress Notes (Signed)
Pt's blood glucose level was 407.  Dr. Claiborne Billingsallahan was paged and he ordered 8 Units of Novolog insulin once.  Will continue to monitor pt. [name], Kandra Graven E,

## 2015-11-24 NOTE — Discharge Summary (Signed)
Physician Discharge Summary  [name] ZOX:[id number] DOB: [birth year] DOA: 11/11/2015  PCP: Gwen Pounds, MD  Admit date: 11/11/2015 Discharge date: 11/24/2015  Time spent: 35 minutes  Recommendations for Outpatient Follow-up:  Wean steroids: 50 mg x 2 days then 40 mg x 2 days, then 30 mg x 2 days, then 20 mg x 2 days, then 10 mg x 2 days then stop O2-- 4L currently-- wean as tolerated colchicine for 3 days stop date: after dose on 1/4 Repeat 2 view xray in 4 weeks to ensure clearing Will need titration of blood sugar meds as steroids are weaned off  Pulmonary toilet Close physician /PA follow up at SNF Poor overall prognosis-- may need palliative care soon if family willing to consider  Discharge Diagnoses:  Principal Problem:   CAP (community acquired pneumonia) Active Problems:   Essential hypertension   Diabetes mellitus with neurological manifestation (HCC)   CHF (congestive heart failure) (HCC)   Elevated troponin   CKD (chronic kidney disease), stage III   Altered mental state   NSTEMI (non-ST elevated myocardial infarction) (HCC)   Acute congestive heart failure (HCC)   Pressure ulcer   Acute on chronic diastolic CHF (congestive heart failure) (HCC)   Acute respiratory failure with hypoxia (HCC)   Discharge Condition: improved  Diet recommendation: cardiac/diabetic  Filed Weights   11/22/15 0500 11/23/15 0456 11/24/15 0434  Weight: 83.3 kg (183 lb 10.3 oz) 88.1 kg (194 lb 3.6 oz) 86.1 kg (189 lb 13.1 oz)    History of present illness:  [name] is a 82 y.o. male with a past medical history significant for HFpEF, CAD s/p CABG, HTN, and IDDM who presents with fever, confusion, and cough.  History is collected from the patient and his wife, both of whom are judged to be poor historians. They report that over the last one month the patient has had increasing dyspnea on exertion and breathing difficulty. They are unclear of the reason, but yesterday  they went to his PCP who prescribed home oxygen 2 L by nasal cannula. Today the patient developed a fever and seemed more confused than usual to his wife brought him to the ER. Because of respiratory difficulty the patient was administered DuoNeb nebs and Solu-Medrol by EMS on route.  In the ED, the patient had a low-grade fever to 100 F, tachycardia, tachypnea, hypertension, and hypoxia requiring 4 L of supplemental oxygen. His renal function was normal but he had an elevated BUN to creatinine ratio. The lactic acid level was normal. He had an high normal WBC. His troponin was 2.2 ng per mL, and BNP was moderately elevated. Chest x-ray showed pulmonary edema. An ECG showed no ST elevations. In the ER the patient was administered Levaquin, heparin infusion was started, and TRH were asked to admit for presumed CAP and ACS.   Hospital Course:  L lung HCAP - Acute respiratory failure with hypoxemia  completed a 7 day course of abx tx - WBC has normalized - afebrile - still requiring 4L [place] oxygen support - attempt to wean O2 (on 2L at home)  -duonebs -steroid taper dexayln -tessalon pearls  -will need follow up x ray to ensure clearing of PNA  ?pericardial friction rub -3 days of colchicine  NSTEMI - known hx of CAD s/p CABG Diffuse dx by cath 12/22 (Only LIMA patent. SVGs occluded) - Cards rec med management w/ DAPT  -appears volume overloaded 12/29: lasix 60 mg BID- d/c'd 1/1 - down 2+L -x  ray with some improvement-- residulal L side PNA will take 6 weeks to clear-- repeat to ensure clearing  Right hip pain -x ray ok -pain only with cough  CKD stage III Periodic BMP  Hypotension Resolved  Hx HTN follow  DM CBG variable  -will need more lantus and novolog while on steroids  GERD  Procedures:  cath  Consultations:  PCCM  cardiology  Discharge Exam: Filed Vitals:   11/24/15 1000 11/24/15 1100  BP: 133/45 132/46  Pulse: 64 71  Temp:  98.3 F (36.8 C)  Resp:  18     General: awake, conversant Cardiovascular: rrr-faint rub Respiratory: no wheezing- tight but moving more air  Discharge Instructions   Discharge Instructions    Diet - low sodium heart healthy    Complete by:  As directed      Diet Carb Modified    Complete by:  As directed      Discharge instructions    Complete by:  As directed   Wean steroids per d/c summary O2-- 4L currently-- wean as tolerated colchicine for 3 days stop date: after dose on 1/4 Repeat 2 view chest xray in 4 weeks to ensure clearing of PNA Will need titration of blood sugar meds as steroids are weaned off     Increase activity slowly    Complete by:  As directed           Current Discharge Medication List    START taking these medications   Details  albuterol (PROVENTIL) (2.5 MG/3ML) 0.083% nebulizer solution Take 3 mLs (2.5 mg total) by nebulization every 2 (two) hours as needed for wheezing or shortness of breath. Qty: 75 mL, Refills: 12    benzonatate (TESSALON) 100 MG capsule Take 1 capsule (100 mg total) by mouth 3 (three) times daily. Qty: 20 capsule, Refills: 0    bisacodyl (DULCOLAX) 10 MG suppository Place 1 suppository (10 mg total) rectally daily as needed for moderate constipation. Qty: 12 suppository, Refills: 0    clopidogrel (PLAVIX) 75 MG tablet Take 1 tablet (75 mg total) by mouth daily.    colchicine 0.6 MG tablet Take 1 tablet (0.6 mg total) by mouth daily. For 3 days    dextromethorphan (DELSYM) 30 MG/5ML liquid Take 2.5 mLs (15 mg total) by mouth 2 (two) times daily. Qty: 89 mL, Refills: 0    !! insulin aspart (NOVOLOG) 100 UNIT/ML injection Inject 0-5 Units into the skin at bedtime. Qty: 10 mL, Refills: 11    !! insulin aspart (NOVOLOG) 100 UNIT/ML injection Inject 0-9 Units into the skin 3 (three) times daily with meals. Qty: 10 mL, Refills: 11    !! insulin aspart (NOVOLOG) 100 UNIT/ML injection Inject 10 Units into the skin 3 (three) times daily with meals. Qty: 10  mL, Refills: 11    ipratropium-albuterol (DUONEB) 0.5-2.5 (3) MG/3ML SOLN Take 3 mLs by nebulization every 4 (four) hours as needed (sob wheezing). Qty: 360 mL    nystatin (MYCOSTATIN) 100000 UNIT/ML suspension Take 5 mLs (500,000 Units total) by mouth 4 (four) times daily. Qty: 60 mL, Refills: 0    polyethylene glycol (MIRALAX / GLYCOLAX) packet Take 17 g by mouth daily. Qty: 14 each, Refills: 0    predniSONE (DELTASONE) 50 MG tablet Take 1 tablet (50 mg total) by mouth daily with breakfast.    zolpidem (AMBIEN) 5 MG tablet Take 1 tablet (5 mg total) by mouth at bedtime as needed for sleep. Qty: 30 tablet, Refills: 0     !! -  Potential duplicate medications found. Please discuss with provider.    CONTINUE these medications which have CHANGED   Details  amLODipine (NORVASC) 5 MG tablet Take 1 tablet (5 mg total) by mouth daily.    traMADol (ULTRAM) 50 MG tablet Take 1 tablet (50 mg total) by mouth every 6 (six) hours as needed for moderate pain. Qty: 15 tablet, Refills: 0      CONTINUE these medications which have NOT CHANGED   Details  atenolol (TENORMIN) 50 MG tablet TAKE 1 TABLET BY MOUTH DAILY Qty: 90 tablet, Refills: 3    esomeprazole (NEXIUM) 40 MG capsule Take 40 mg by mouth daily before breakfast.      fenofibrate 160 MG tablet Take 160 mg by mouth daily.      furosemide (LASIX) 20 MG tablet Take 0.5 tablets by mouth daily. Refills: 0    gabapentin (NEURONTIN) 300 MG capsule Take 300 mg by mouth 2 (two) times daily.     Insulin Glargine (LANTUS [place]) Inject 40 Units into the skin at bedtime.     isosorbide mononitrate (IMDUR) 30 MG 24 hr tablet Take 1 tablet (30 mg total) by mouth daily. Qty: 90 tablet, Refills: 11    RANEXA 1000 MG SR tablet TAKE 1 TABLET BY MOUTH TWICE A DAY Qty: 60 tablet, Refills: 11    simvastatin (ZOCOR) 20 MG tablet TAKE 1 TABLET BY MOUTH AT BEDTIME Qty: 30 tablet, Refills: 6    aspirin EC 81 MG tablet Take 1 tablet (81 mg total) by  mouth daily.      STOP taking these medications     Insulin Lispro, Human, (HUMALOG PEN [place])      lisinopril (PRINIVIL,ZESTRIL) 20 MG tablet      mometasone (ELOCON) 0.1 % cream        Allergies  Allergen Reactions  . Morphine And Related     Altered mental status  . Tetracyclines & Related     "Raw throat"   Follow-up Information    Follow up with Gwen PoundsUSSO,JOHN M, MD In 1 week.   Specialty:  Internal Medicine   Contact information:   [address] [place] [address] [phone]       Follow up with Nahser, Deloris PingPhilip J, MD In 1 month.   Specialty:  Cardiology   Contact information:   [address] [place] [address] [phone]        The results of significant diagnostics from this hospitalization (including imaging, microbiology, ancillary and laboratory) are listed below for reference.    Significant Diagnostic Studies: Dg Chest 2 View  11/22/2015  CLINICAL DATA:  SOB and cough. Hx HTN, diabetes, CAD, and MI. EXAM: CHEST  2 VIEW COMPARISON:  11/20/2015 FINDINGS: Midline trachea. Cardiomegaly accentuated by AP portable technique. Prior median sternotomy. Moderate right hemidiaphragm elevation. No pleural effusion or pneumothorax. Improved right-sided aeration. Interstitial and airspace disease throughout the left lung is similar. IMPRESSION: Improved right and similar left-sided aeration. Left-sided interstitial and airspace disease is suspicious for infection. Asymmetric pulmonary edema felt less likely. Similar moderate right hemidiaphragm elevation. Electronically Signed   By: Jeronimo GreavesKyle  Talbot M.D.   On: 11/22/2015 15:17   Dg Clavicle Left  11/07/2015  CLINICAL DATA:  Recent fall onto left shoulder with clavicular pain, initial encounter EXAM: LEFT CLAVICLE - 2+ VIEWS COMPARISON:  None. FINDINGS: There is no evidence of fracture or other focal bone lesions. Soft tissues are unremarkable. IMPRESSION: No acute abnormality noted. Electronically Signed    By:  Alcide Clever M.D.   On: 11/07/2015 19:25   Ct Head Wo Contrast  11/13/2015  CLINICAL DATA:  Altered mental status.  Impaired orientation EXAM: CT HEAD WITHOUT CONTRAST TECHNIQUE: Contiguous axial images were obtained from the base of the skull through the vertex without intravenous contrast. COMPARISON:  08/13/2006 FINDINGS: Skull and Sinuses:Negative for fracture or destructive process. Mucosal thickening focally in the right maxillary antrum, without fluid level and presumed incidental given the history. Visualized orbits: Bilateral cataract resection Brain: No evidence of acute infarction, hemorrhage, hydrocephalus, or mass lesion/mass effect. Generalized cerebral volume loss which is normal for age. Normal cerebral white matter for age. IMPRESSION: 1. Normal intracranial imaging for age. 2. Chronic right maxillary sinusitis. Electronically Signed   By: Marnee Spring M.D.   On: 11/13/2015 04:53   Dg Chest Port 1 View  11/20/2015  CLINICAL DATA:  Shortness of breath. EXAM: PORTABLE CHEST 1 VIEW COMPARISON:  11/17/2015 FINDINGS: Since the prior exam, the support apparatus has been removed. There is airspace consolidation throughout the left lung. There are irregular interstitial opacities in the central lower right lung. Lung opacities have increased when compared to the prior exam. There is elevation of the right hemidiaphragm, more prominent than on the prior study. Changes from CABG surgery are stable. Cardiac silhouette is normal in size. No mediastinal or convincing hilar masses. No pneumothorax noted on this semi-erect exam. IMPRESSION: 1. Worsened lung aeration when compared to the prior exam with increased irregular interstitial opacities on the right increased airspace opacity on the left. Elevation the right hemidiaphragm is more accentuated than on the prior study. Electronically Signed   By: Amie Portland M.D.   On: 11/20/2015 12:07   Dg Chest Port 1 View  11/17/2015  CLINICAL DATA:   Respiratory failure, ventilatory support EXAM: PORTABLE CHEST 1 VIEW COMPARISON:  11/15/2015 FINDINGS: Endotracheal tube 2.5 cm above the carina. NG tube enters the stomach with the tip not visualized. Prior coronary bypass changes noted. Stable cardiomegaly with similar asymmetric airspace opacities throughout the left lung and right lower lobe. No significant interval change in aeration pattern. No enlarging effusion or pneumothorax. IMPRESSION: Stable support apparatus. No change in asymmetric airspace disease, worse in the left lung compatible with pneumonia versus asymmetric edema. Electronically Signed   By: Judie Petit.  Shick M.D.   On: 11/17/2015 09:42   Dg Chest Port 1 View  11/15/2015  CLINICAL DATA:  Difficult intubation EXAM: PORTABLE CHEST 1 VIEW COMPARISON:  Radiograph 11/15/2015 FINDINGS: Endotracheal tube positioned 4 cm from carina. NG tube extends into the stomach. Normal cardiac silhouette. There is perihilar airspace disease greater on the LEFT similar prior. No pneumothorax. IMPRESSION: 1. Endotracheal tube appears in good position. 2. Stable bilateral airspace disease greater on the LEFT. Electronically Signed   By: Genevive Bi M.D.   On: 11/15/2015 13:57   Dg Chest Port 1 View  11/15/2015  CLINICAL DATA:  82 year old male with congestive heart failure EXAM: PORTABLE CHEST 1 VIEW COMPARISON:  Prior chest x-ray 11/14/2015 FINDINGS: Stable cardiac and mediastinal contours. Patient is status post median sternotomy with evidence of prior multivessel CABG. Similar to perhaps incrementally improved interstitial pulmonary edema with slight clearing in the right mid lung. There is persistent asymmetrically dense opacification of the left mid lung. Probable small bilateral pleural effusions. No pneumothorax. No acute osseous abnormality. IMPRESSION: 1. Perhaps incrementally improved interstitial pulmonary edema. 2. Persistent asymmetric patchy airspace opacity in the left mid lung concerning for  either superimposed pneumonia, or (less  likely) asymmetric pulmonary edema. Electronically Signed   By: Malachy Moan M.D.   On: 11/15/2015 09:09   Dg Chest Port 1 View  11/14/2015  CLINICAL DATA:  CHF, hypertension, diabetes mellitus, coronary artery disease post MI, PTCA and CABG EXAM: PORTABLE CHEST 1 VIEW COMPARISON:  Portable exam 0544 hours compared to 11/13/2015 FINDINGS: Enlargement of cardiac silhouette post CABG. Stable mediastinal contours. Atherosclerotic calcification aorta. Chronic elevation of RIGHT diaphragm with RIGHT basilar atelectasis. Diffuse pulmonary infiltrates bilaterally, asymmetrically greater on LEFT, question asymmetric edema versus infection. No gross pleural effusion or pneumothorax. IMPRESSION: Enlargement of cardiac silhouette post CABG. Persistent asymmetric infiltrates LEFT greater than RIGHT question asymmetric edema versus pneumonia. Electronically Signed   By: Ulyses Southward M.D.   On: 11/14/2015 08:08   Dg Chest Port 1 View  11/13/2015  CLINICAL DATA:  Chest pain EXAM: PORTABLE CHEST - 1 VIEW COMPARISON:  11/11/2015 FINDINGS: Cardiac shadow is mildly enlarged but stable. Postoperative changes are again seen. Increasing bilateral infiltrates are noted consistent with progressive pulmonary edema. Elevation of the right hemidiaphragm is again seen. IMPRESSION: Increasing pulmonary edema bilaterally. Electronically Signed   By: Alcide Clever M.D.   On: 11/13/2015 08:29   Dg Chest Port 1 View  11/11/2015  CLINICAL DATA:  Fever and wheezing EXAM: PORTABLE CHEST - 1 VIEW COMPARISON:  10/28/2011 FINDINGS: Cardiac shadow is again enlarged. Postsurgical changes are again seen. Elevation the right hemidiaphragm is noted. Vascular congestion with patchy edematous changes are seen this likely represents CHF although the possibility of superimposed infiltrate deserves consideration as well. IMPRESSION: Increased vascular congestion and patchy changes likely related to CHF.  Electronically Signed   By: Alcide Clever M.D.   On: 11/11/2015 21:50   Dg Shoulder Left  11/07/2015  CLINICAL DATA:  82 year old male with acute left shoulder pain following fall today. EXAM: LEFT SHOULDER - 2+ VIEW COMPARISON:  None. FINDINGS: There is no evidence of acute fracture, subluxation or dislocation. Degenerative changes at the glenohumeral joint noted. Visualized bony left hemi thorax is unremarkable. IMPRESSION: No acute bony abnormality. Electronically Signed   By: Harmon Pier M.D.   On: 11/07/2015 19:43   Dg Abd Portable 1v  11/15/2015  CLINICAL DATA:  OG tube placement EXAM: PORTABLE ABDOMEN - 1 VIEW COMPARISON:  11/14/2015 FINDINGS: The enteric tube tip is in the body of the stomach. Side port is below GE junction. The bowel gas pattern is unremarkable. IMPRESSION: 1. Tip of the enteric tube is in the stomach with side port below GE junction. Electronically Signed   By: Signa Kell M.D.   On: 11/15/2015 17:03   Dg Abd Portable 1v  11/14/2015  CLINICAL DATA:  Abdominal distension EXAM: PORTABLE ABDOMEN - 1 VIEW COMPARISON:  None. FINDINGS: Scattered large and small bowel gas is noted. No obstructive changes are seen. Postoperative changes are noted in the lumbar spine and right hip. No abnormal mass or abnormal calcifications are seen. Multilevel degenerative changes in the lumbar spine are noted. No free air is seen. IMPRESSION: No acute abnormality noted. Electronically Signed   By: Alcide Clever M.D.   On: 11/14/2015 12:05   Dg Swallowing Func-speech Pathology  11/21/2015  Objective Swallowing Evaluation:   Patient Details Name: [name] MRN: [id number] Date of Birth: 1928/03/07 Today's Date: 11/21/2015 Time: SLP Start Time (ACUTE ONLY): 0845-SLP Stop Time (ACUTE ONLY): 0910 SLP Time Calculation (min) (ACUTE ONLY): 25 min Past Medical History: @PMH @ Past Surgical History: Past Surgical History Procedure Laterality Date .  Lithotripsy   . US echocardiography  01-16-09   EF  55-60 . Cardiovascular stress test  09-26-03   EF 43% . Cardiac catheterization   . Coronary artery bypass graft     CABG X 4 . Coronary angioplasty with stent placement   . Tonsillectomy   . Cataract extraction w/ intraocular lens implant     right . Back surgery     x6 . Total hip arthroplasty  10/29/2011   Procedure: TOTAL HIP ARTHROPLASTY;  Surgeon: Shelda Pal;  Location: [facility]S;  Service: Orthopedics;  Laterality: Right; . Joint replacement   . Eye surgery   . Spine surgery   . Cardiac catheterization N/A 11/13/2015   Procedure: Left Heart Cath and Cors/Grafts Angiography;  Surgeon: Lennette Bihari, MD;  Location: [facility] INVASIVE CV LAB;  Service: Cardiovascular;  Laterality: N/A; HPI: 82 y.o. male with a past medical history significant for HFpEF, CAD s/p CABG, HTN, and IDDM who presents with fever, confusion, and cough. No Data Recorded Assessment / Plan / Recommendation CHL IP CLINICAL IMPRESSIONS 11/21/2015 Therapy Diagnosis Mild oral phase dysphagia;Mild pharyngeal phase dysphagia Clinical Impression Pt demonstrates a mild oral dysphagia with lingual residuals that pt transits/spills to pharynx post swallow. Oropharyngeal phase also mildy impaired with weakness across hyolaryngeal musculature resulting in flash penetration during the swallow and mild residuals post swallow. The pt consistently senses oral and oropharyngeal residue and independently initiates a second swallow to clear. No frank penetration or aspiration occurred during the study though pt did have some hard coughing unrelated to swallowing. Pt is recommended to consume a regular diet and thin liquids with basic aspiration precautions, particularly upright posture and awareness of respiratory function while eating. Will f/u x1 to reinforce education.  Impact on safety and function Mild aspiration risk   CHL IP TREATMENT RECOMMENDATION 11/21/2015 Treatment Recommendations Therapy as outlined in treatment plan below   Prognosis 11/21/2015  Prognosis for Safe Diet Advancement Good Barriers to Reach Goals -- Barriers/Prognosis Comment -- CHL IP DIET RECOMMENDATION 11/21/2015 SLP Diet Recommendations Regular solids;Thin liquid Liquid Administration via Cup;Straw Medication Administration Whole meds with liquid Compensations Minimize environmental distractions;Slow rate;Small sips/bites;Follow solids with liquid Postural Changes Remain semi-upright after after feeds/meals (Comment);Seated upright at 90 degrees   CHL IP OTHER RECOMMENDATIONS 11/21/2015 Recommended Consults -- Oral Care Recommendations Oral care BID;Patient independent with oral care Other Recommendations --   CHL IP FOLLOW UP RECOMMENDATIONS 11/21/2015 Follow up Recommendations None   CHL IP FREQUENCY AND DURATION 11/21/2015 Speech Therapy Frequency (ACUTE ONLY) min 1 x/week Treatment Duration 1 week      CHL IP ORAL PHASE 11/21/2015 Oral Phase Impaired Oral - Pudding Teaspoon -- Oral - Pudding Cup -- Oral - Honey Teaspoon -- Oral - Honey Cup -- Oral - Nectar Teaspoon -- Oral - Nectar Cup -- Oral - Nectar Straw -- Oral - Thin Teaspoon -- Oral - Thin Cup Lingual/palatal residue Oral - Thin Straw -- Oral - Puree Lingual/palatal residue Oral - Mech Soft -- Oral - Regular Lingual/palatal residue Oral - Multi-Consistency -- Oral - Pill Lingual/palatal residue Oral Phase - Comment --  CHL IP PHARYNGEAL PHASE 11/21/2015 Pharyngeal Phase Impaired Pharyngeal- Pudding Teaspoon -- Pharyngeal -- Pharyngeal- Pudding Cup -- Pharyngeal -- Pharyngeal- Honey Teaspoon -- Pharyngeal -- Pharyngeal- Honey Cup -- Pharyngeal -- Pharyngeal- Nectar Teaspoon -- Pharyngeal -- Pharyngeal- Nectar Cup -- Pharyngeal -- Pharyngeal- Nectar Straw -- Pharyngeal -- Pharyngeal- Thin Teaspoon -- Pharyngeal -- Pharyngeal- Thin Cup Reduced pharyngeal peristalsis;Reduced epiglottic inversion;Reduced anterior laryngeal  mobility;Reduced laryngeal elevation;Penetration/Aspiration during swallow;Reduced airway/laryngeal  closure;Pharyngeal residue - valleculae;Pharyngeal residue - pyriform Pharyngeal Material enters airway, remains ABOVE vocal cords then ejected out Pharyngeal- Thin Straw Reduced pharyngeal peristalsis;Reduced epiglottic inversion;Reduced anterior laryngeal mobility;Reduced laryngeal elevation;Penetration/Aspiration during swallow;Reduced airway/laryngeal closure;Pharyngeal residue - valleculae;Pharyngeal residue - pyriform Pharyngeal Material enters airway, remains ABOVE vocal cords then ejected out Pharyngeal- Puree Reduced pharyngeal peristalsis;Reduced epiglottic inversion;Reduced anterior laryngeal mobility;Reduced laryngeal elevation;Pharyngeal residue - valleculae;Pharyngeal residue - pyriform Pharyngeal Material does not enter airway Pharyngeal- Mechanical Soft -- Pharyngeal -- Pharyngeal- Regular Reduced pharyngeal peristalsis;Reduced epiglottic inversion;Reduced anterior laryngeal mobility;Reduced laryngeal elevation;Pharyngeal residue - valleculae;Pharyngeal residue - pyriform Pharyngeal -- Pharyngeal- Multi-consistency -- Pharyngeal -- Pharyngeal- Pill Reduced pharyngeal peristalsis;Reduced epiglottic inversion;Reduced anterior laryngeal mobility;Reduced laryngeal elevation;Pharyngeal residue - valleculae;Pharyngeal residue - pyriform Pharyngeal -- Pharyngeal Comment --  No flowsheet data found. Harlon Ditty, MA CCC-SLP [phone] Claudine Mouton 11/21/2015, 10:08 AM              Dg Hip Unilat With Pelvis 2-3 Views Right  11/22/2015  CLINICAL DATA:  Right hip pain. No known injury. Hx right hip replacement. EXAM: DG HIP (WITH OR WITHOUT PELVIS) 2-3V RIGHT COMPARISON:  None. FINDINGS: Status post right hip arthroplasty. The acetabular component is well seated in the %%%%%% of the femoral head component is well seated in the acetabular component. No evidence for acute fracture. Degenerative changes are seen in the lower lumbar spine. Contrast is identified within the ascending colon and  appendix, normal in appearance. IMPRESSION: Status post right hip arthroplasty. No evidence for acute abnormality. Electronically Signed   By: Norva Pavlov M.D.   On: 11/22/2015 15:20    Microbiology: Recent Results (from the past 240 hour(s))  Culture, respiratory (NON-Expectorated)     Status: None   Collection Time: 11/15/15  1:24 PM  Result Value Ref Range Status   Specimen Description TRACHEAL ASPIRATE  Final   Special Requests Normal  Final   Gram Stain   Final    ABUNDANT WBC FEW SQUAMOUS EPITHELIAL CELLS PRESENT FEW YEAST Performed at [facility]    Culture   Final    ABUNDANT YEAST CONSISTENT WITH CANDIDA SPECIES Performed at [facility]    Report Status 11/18/2015 FINAL  Final  Culture, blood (Routine X 2) w Reflex to ID Panel     Status: None   Collection Time: 11/15/15  3:15 PM  Result Value Ref Range Status   Specimen Description BLOOD LEFT ANTECUBITAL  Final   Special Requests BOTTLES DRAWN AEROBIC AND ANAEROBIC 10CC  Final   Culture NO GROWTH 5 DAYS  Final   Report Status 11/20/2015 FINAL  Final  Culture, blood (Routine X 2) w Reflex to ID Panel     Status: None   Collection Time: 11/15/15  3:30 PM  Result Value Ref Range Status   Specimen Description BLOOD BLOOD LEFT HAND  Final   Special Requests BOTTLES DRAWN AEROBIC AND ANAEROBIC 10CC  Final   Culture NO GROWTH 5 DAYS  Final   Report Status 11/20/2015 FINAL  Final     Labs: Basic Metabolic Panel:  Recent Labs Lab 11/19/15 0356 11/20/15 0322 11/21/15 0242 11/22/15 0324 11/23/15 0443  NA 140 139 140 142 139  K 4.2 4.9 4.3 4.0 3.9  CL 105 102 102 101 98*  CO2 26 28 29 31  33*  GLUCOSE 126* 240* 180* 162* 154*  BUN 54* 46* 43* 45* 50*  CREATININE 1.53* 1.56* 1.66* 1.63* 1.85*  CALCIUM 8.2* 8.3* 8.2*  8.3* 8.5*   Liver Function Tests:  Recent Labs Lab 11/20/15 0322  AST 65*  ALT 43  ALKPHOS 50  BILITOT 1.2  PROT 5.9*  ALBUMIN 2.2*   No results for input(s): LIPASE,  AMYLASE in the last 168 hours. No results for input(s): AMMONIA in the last 168 hours. CBC:  Recent Labs Lab 11/18/15 0239 11/19/15 0356 11/20/15 0322 11/21/15 0242 11/22/15 0324 11/23/15 0443  WBC 6.7 7.7 10.1 10.3 10.3 8.9  NEUTROABS 4.8  --   --   --   --   --   HGB 10.2* 9.8* 9.9* 9.3* 9.3* 9.1*  HCT 30.0* 29.6* 29.5* 28.3* 28.6* 27.2*  MCV 98.7 98.3 99.3 99.3 98.6 98.9  PLT 164 211 267 299 328 350   Cardiac Enzymes: No results for input(s): CKTOTAL, CKMB, CKMBINDEX, TROPONINI in the last 168 hours. BNP: BNP (last 3 results)  Recent Labs  11/11/15 2227 11/20/15 1325  BNP 414.8* 1407.2*    ProBNP (last 3 results) No results for input(s): PROBNP in the last 8760 hours.  CBG:  Recent Labs Lab 11/24/15 0346 11/24/15 0350 11/24/15 0533 11/24/15 0633 11/24/15 1112  GLUCAP 398* 407* 395* 382* 358*       Signed:  Jamerion Cabello U Brystal Kildow DO  [facility] 11/24/2015, 1:21 PM

## 2015-11-24 NOTE — Care Management Note (Signed)
Case Management Note  Patient Details  Name: Dylan BennettRichard J Narula MRN: [id number] Date of Birth: 1928-02-06  Subjective/Objective:         Hypoxia with sepsis and chf vs pnc           Action/Plan: 11/24/2015 Pt transferred from ICU today 11/18/15.  Recommendation is for CIR post discharge, CIR assessed pt and determined pt be be a better fit for SNF.  CM ordered  CSW consult  Date: November 12, 2015 Chart reviewed for concurrent status and case management needs. Will continue to follow patient for changes and needs: Marcelle Smilinghonda [name], RN, BSN, [place]   [phone]  Expected Discharge Date:   (unknown)               Expected Discharge Plan:  Home w Home Health Services  In-House Referral:  NA  Discharge planning Services  CM Consult  Post Acute Care Choice:  Home Health, Durable Medical Equipment Choice offered to:  Spouse  DME Arranged:    DME Agency:     HH Arranged:    HH Agency:     Status of Service:  In process, will continue to follow  Medicare Important Message Given:  Yes Date Medicare IM Given:    Medicare IM give by:    Date Additional Medicare IM Given:    Additional Medicare Important Message give by:     If discussed at Long Length of Stay Meetings, dates discussed:    Additional Comments: Pt will discharge to SNF today 11/24/15 [name], Mayra Brahm S, RN 11/24/2015, 12:39 PM

## 2015-11-24 NOTE — Progress Notes (Signed)
Pt blood glucose was 395 after giving 8 Units on insulin and Solumedrol was not yet given.  Dr. Claiborne Billingsallahan informed and ordered a one time dose of 10 Units of Novolog insulin.  The insulin and Solumedrol were both given.  Will continue to monitor pt. Harriet Massonavidson, Lacrisha Bielicki E, RN

## 2015-11-24 NOTE — Progress Notes (Signed)
Physical Therapy Treatment Patient Details Name: [name] MRN: [id number] DOB: Mar 29, 1928 Today's Date: 11/24/2015    History of Present Illness 82 y/o male with CAD, admitted on 12/21 with NSTEMI, had a cath 12/22, no interventions made. Developed HCAP, intubated 12/24.     PT Comments    Patient progressing slowly towards PT goals. Requires less assist with standing today from prior session. Improved ambulation distance with encouragement however pt with DOE and fatigue limiting distance. Tolerated there ex sitting EOB. Appropriate for ST SNF to maximize independence. Will follow acutely.   Follow Up Recommendations  SNF     Equipment Recommendations  None recommended by PT    Recommendations for Other Services       Precautions / Restrictions Precautions Precautions: Fall Restrictions Weight Bearing Restrictions: No    Mobility  Bed Mobility Overal bed mobility: Needs Assistance Bed Mobility: Rolling;Sidelying to Sit Rolling: Min guard Sidelying to sit: Min assist;HOB elevated       General bed mobility comments: Increased time and use of rails.   Transfers Overall transfer level: Needs assistance Equipment used: Rolling walker (2 wheeled) Transfers: Sit to/from Stand Sit to Stand: Min assist;From elevated surface         General transfer comment: Stood from EOB x1 with cues for hand placement/technique. Not able to stand fully upright - reports as premorbid.  Ambulation/Gait Ambulation/Gait assistance: Min assist Ambulation Distance (Feet): 14 Feet Assistive device: Rolling walker (2 wheeled) Gait Pattern/deviations: Shuffle;Decreased stride length;Step-through pattern;Trunk flexed Gait velocity: slow Gait velocity interpretation: <1.8 ft/sec, indicative of risk for recurrent falls General Gait Details: pt unable to stand upright, amb with trunk flexion so he was parallel to floor despite max v/c's to achieve trunk extension. DOE 3/4. + Dry  cough.   Stairs            Wheelchair Mobility    Modified Rankin (Stroke Patients Only)       Balance Overall balance assessment: Needs assistance Sitting-balance support: Feet supported;No upper extremity supported Sitting balance-Leahy Scale: Fair Sitting balance - Comments: Able to perform there ex without LOB.   Standing balance support: During functional activity Standing balance-Leahy Scale: Fair Standing balance comment: Unable to achieve full upright posture, requires UE support.                    Cognition Arousal/Alertness: Awake/alert Behavior During Therapy: WFL for tasks assessed/performed Overall Cognitive Status: Within Functional Limits for tasks assessed                      Exercises General Exercises - Lower Extremity Ankle Circles/Pumps: Both;15 reps;Seated Long Arc Quad: Both;15 reps;Seated Toe Raises: Both;15 reps;Seated Heel Raises: Both;15 reps;Seated    General Comments        Pertinent Vitals/Pain Pain Assessment: No/denies pain    Home Living                      Prior Function            PT Goals (current goals can now be found in the care plan section) Progress towards PT goals: Progressing toward goals    Frequency  Min 2X/week    PT Plan Current plan remains appropriate    Co-evaluation             End of Session Equipment Utilized During Treatment: Gait belt;Oxygen Activity Tolerance: Patient limited by fatigue Patient left: in chair;with call bell/phone within reach;with  nursing/sitter in room     Time: 1341-1402 PT Time Calculation (min) (ACUTE ONLY): 21 min  Charges:  $Gait Training: 8-22 mins                    G Codes:      Veer Elamin A Bach Rocchi 11/24/2015, 2:21 PM  Mylo RedShauna Armen Waring, PT, DPT [phone]

## 2015-11-24 NOTE — Progress Notes (Signed)
Patient being d/c to [facility]  Marland Kitchen.Pt. Discharged via EMTALA with Discharge information reviewed and given All personal belongings given to Pt.  Education discussed IV was d/c Tele d/c

## 2015-11-24 NOTE — Progress Notes (Signed)
Nutrition Follow-up  DOCUMENTATION CODES:   Not applicable  INTERVENTION:   -Continue with Carb Modified diet  NUTRITION DIAGNOSIS:   Inadequate oral intake related to inability to eat as evidenced by NPO status.  Resolved  GOAL:   Patient will meet greater than or equal to 90% of their needs  Met  MONITOR:   PO intake, Labs, Weight trends, Skin, I & O's  REASON FOR ASSESSMENT:   Consult Enteral/tube feeding initiation and management  ASSESSMENT:   82 yo remote smoker with diffuse CAD not amenable to invasive intervention , +NSEMI 12/21, left pna with worsening resp distress and intolerant of NIMVS. Suspect he will need elective intubation to prevent emergent intubation later on. Note he has been diuresed and may become hypotensive post intubation. PCCM will assume his care while he is intubated. He is a full code.  Pt extubated on 11/17/15 ans transferred from ICU to medica floor on 11/18/15.   Pt passed RN swallow evaluation on 11/18/15. SLP following; pt was placed on a dysphagia 3 diet with thin liquids on 11/18/15 and transitioned to a dysphagia 3 diet with nectar thick liquids on 11/20/15 due to declining respiratory status. Pt underwent MBSS on 11/21/15, which revealed mild dysphagia; currently on a Carb Modified diet.  Pt sleeping soundly at time of visit; RD did not wake. Tolerating current diet well. Noted 50-100% meal completion.    Labs reviewed: CBGS: 382-407. Pt is on steroids; noted basal and sliding scale insulin regimen initiated on 11/24/15.   CSW following. Plan to d/c to SNF ([facility]) once medically stable.   Diet Order:  Diet Carb Modified Fluid consistency:: Thin; Room service appropriate?: Yes  Skin:  Reviewed, no issues  Last BM:  11/23/15  Height:   Ht Readings from Last 1 Encounters:  11/11/15 5' 9"  (1.753 m)    Weight:   Wt Readings from Last 1 Encounters:  11/24/15 189 lb 13.1 oz (86.1 kg)    Ideal Body Weight:  73 kg  BMI:   Body mass index is 28.02 kg/(m^2).  Estimated Nutritional Needs:   Kcal:  1700-1900  Protein:  85-100 grams  Fluid:  1.7-1.9 L  EDUCATION NEEDS:   No education needs identified at this time  Shirel Mallis A. Jimmye Norman, RD, LDN, CDE Pager: [phone] After hours Pager: [phone]

## 2015-11-24 NOTE — Progress Notes (Signed)
Patient had a blood glucose level of 380.  He gets 20 Units of Lantus at 22:00 but he's getting Solumedrol 60 mg IV every six hours.  Dr. Claiborne Billingsallahan was asked if patient should be given Novolg insulin as well tonight.  Dr. Claiborne Billingsallahan ordered a insulin sliding scale to be done nightly at 22:00.  Also, Dr. Claiborne Billingsallahan increased the amount of insulin to be given for the insulin sliding scale given with meals.  Will continue to monitor the patient. Harriet Massonavidson, Emmerson Taddei E, RN

## 2015-11-24 NOTE — Progress Notes (Signed)
Patient will discharge to [facility] Anticipated discharge date:1/2 Family notified: pt wife Transportation by [employer]- scheduled for 2:45pm  CSW signing off.  Merlyn LotJenna Holoman, LCSWA Clinical Social Worker [phone]

## 2015-12-03 ENCOUNTER — Other Ambulatory Visit: Payer: Self-pay | Admitting: Cardiovascular Disease

## 2015-12-22 ENCOUNTER — Ambulatory Visit: Payer: Medicare Other | Admitting: Cardiovascular Disease

## 2015-12-23 ENCOUNTER — Encounter (HOSPITAL_COMMUNITY): Payer: Self-pay | Admitting: Emergency Medicine

## 2015-12-23 ENCOUNTER — Inpatient Hospital Stay (HOSPITAL_COMMUNITY): Payer: Medicare Other

## 2015-12-23 ENCOUNTER — Inpatient Hospital Stay (HOSPITAL_COMMUNITY)
Admission: EM | Admit: 2015-12-23 | Discharge: 2015-12-29 | DRG: 291 | Disposition: A | Discharge status: Skilled Nursing Facility | Payer: Medicare Other | Attending: Internal Medicine | Admitting: Internal Medicine

## 2015-12-23 ENCOUNTER — Emergency Department (HOSPITAL_COMMUNITY): Payer: Medicare Other

## 2015-12-23 DIAGNOSIS — Z515 Encounter for palliative care: Secondary | ICD-10-CM | POA: Diagnosis not present

## 2015-12-23 DIAGNOSIS — Z7952 Long term (current) use of systemic steroids: Secondary | ICD-10-CM

## 2015-12-23 DIAGNOSIS — E1143 Type 2 diabetes mellitus with diabetic autonomic (poly)neuropathy: Secondary | ICD-10-CM | POA: Diagnosis present

## 2015-12-23 DIAGNOSIS — I10 Essential (primary) hypertension: Secondary | ICD-10-CM | POA: Diagnosis not present

## 2015-12-23 DIAGNOSIS — Z87891 Personal history of nicotine dependence: Secondary | ICD-10-CM | POA: Diagnosis not present

## 2015-12-23 DIAGNOSIS — E1149 Type 2 diabetes mellitus with other diabetic neurological complication: Secondary | ICD-10-CM | POA: Diagnosis present

## 2015-12-23 DIAGNOSIS — N183 Chronic kidney disease, stage 3 unspecified: Secondary | ICD-10-CM | POA: Diagnosis present

## 2015-12-23 DIAGNOSIS — I130 Hypertensive heart and chronic kidney disease with heart failure and stage 1 through stage 4 chronic kidney disease, or unspecified chronic kidney disease: Principal | ICD-10-CM | POA: Diagnosis present

## 2015-12-23 DIAGNOSIS — R5383 Other fatigue: Secondary | ICD-10-CM

## 2015-12-23 DIAGNOSIS — Z955 Presence of coronary angioplasty implant and graft: Secondary | ICD-10-CM

## 2015-12-23 DIAGNOSIS — E785 Hyperlipidemia, unspecified: Secondary | ICD-10-CM | POA: Diagnosis present

## 2015-12-23 DIAGNOSIS — Z66 Do not resuscitate: Secondary | ICD-10-CM | POA: Diagnosis not present

## 2015-12-23 DIAGNOSIS — E1122 Type 2 diabetes mellitus with diabetic chronic kidney disease: Secondary | ICD-10-CM | POA: Diagnosis present

## 2015-12-23 DIAGNOSIS — J189 Pneumonia, unspecified organism: Secondary | ICD-10-CM | POA: Diagnosis present

## 2015-12-23 DIAGNOSIS — R5381 Other malaise: Secondary | ICD-10-CM | POA: Diagnosis present

## 2015-12-23 DIAGNOSIS — Z794 Long term (current) use of insulin: Secondary | ICD-10-CM | POA: Diagnosis not present

## 2015-12-23 DIAGNOSIS — E11649 Type 2 diabetes mellitus with hypoglycemia without coma: Secondary | ICD-10-CM | POA: Diagnosis not present

## 2015-12-23 DIAGNOSIS — D649 Anemia, unspecified: Secondary | ICD-10-CM | POA: Diagnosis not present

## 2015-12-23 DIAGNOSIS — I252 Old myocardial infarction: Secondary | ICD-10-CM | POA: Diagnosis not present

## 2015-12-23 DIAGNOSIS — Z881 Allergy status to other antibiotic agents status: Secondary | ICD-10-CM

## 2015-12-23 DIAGNOSIS — I2510 Atherosclerotic heart disease of native coronary artery without angina pectoris: Secondary | ICD-10-CM | POA: Diagnosis present

## 2015-12-23 DIAGNOSIS — Z7982 Long term (current) use of aspirin: Secondary | ICD-10-CM | POA: Diagnosis not present

## 2015-12-23 DIAGNOSIS — I509 Heart failure, unspecified: Secondary | ICD-10-CM

## 2015-12-23 DIAGNOSIS — Z96649 Presence of unspecified artificial hip joint: Secondary | ICD-10-CM | POA: Diagnosis present

## 2015-12-23 DIAGNOSIS — J9621 Acute and chronic respiratory failure with hypoxia: Secondary | ICD-10-CM | POA: Diagnosis present

## 2015-12-23 DIAGNOSIS — R0603 Acute respiratory distress: Secondary | ICD-10-CM

## 2015-12-23 DIAGNOSIS — A419 Sepsis, unspecified organism: Secondary | ICD-10-CM | POA: Insufficient documentation

## 2015-12-23 DIAGNOSIS — Z951 Presence of aortocoronary bypass graft: Secondary | ICD-10-CM

## 2015-12-23 DIAGNOSIS — R0602 Shortness of breath: Secondary | ICD-10-CM

## 2015-12-23 DIAGNOSIS — Y95 Nosocomial condition: Secondary | ICD-10-CM | POA: Diagnosis present

## 2015-12-23 DIAGNOSIS — E1349 Other specified diabetes mellitus with other diabetic neurological complication: Secondary | ICD-10-CM

## 2015-12-23 DIAGNOSIS — Z7189 Other specified counseling: Secondary | ICD-10-CM | POA: Diagnosis not present

## 2015-12-23 DIAGNOSIS — Z8701 Personal history of pneumonia (recurrent): Secondary | ICD-10-CM | POA: Diagnosis not present

## 2015-12-23 DIAGNOSIS — Z7902 Long term (current) use of antithrombotics/antiplatelets: Secondary | ICD-10-CM | POA: Diagnosis not present

## 2015-12-23 DIAGNOSIS — R0600 Dyspnea, unspecified: Secondary | ICD-10-CM | POA: Diagnosis present

## 2015-12-23 DIAGNOSIS — I5033 Acute on chronic diastolic (congestive) heart failure: Secondary | ICD-10-CM | POA: Diagnosis present

## 2015-12-23 DIAGNOSIS — I5032 Chronic diastolic (congestive) heart failure: Secondary | ICD-10-CM | POA: Diagnosis present

## 2015-12-23 DIAGNOSIS — Z9981 Dependence on supplemental oxygen: Secondary | ICD-10-CM | POA: Diagnosis not present

## 2015-12-23 DIAGNOSIS — Z885 Allergy status to narcotic agent status: Secondary | ICD-10-CM

## 2015-12-23 HISTORY — DX: Reserved for inherently not codable concepts without codable children: IMO0001

## 2015-12-23 HISTORY — DX: Chronic respiratory failure, unspecified whether with hypoxia or hypercapnia: J96.10

## 2015-12-23 HISTORY — DX: Pneumonia, unspecified organism: J18.9

## 2015-12-23 LAB — I-STAT CG4 LACTIC ACID, ED
Lactic Acid, Venous: 1.3 mmol/L (ref 0.5–2.0)
Lactic Acid, Venous: 1.07 mmol/L (ref 0.5–2.0)
Lactic Acid, Venous: 0.72 mmol/L (ref 0.5–2.0)

## 2015-12-23 LAB — URINALYSIS, ROUTINE W REFLEX MICROSCOPIC
BILIRUBIN URINE: NEGATIVE
Glucose, UA: NEGATIVE mg/dL
HGB URINE DIPSTICK: NEGATIVE
Ketones, ur: NEGATIVE mg/dL
Leukocytes, UA: NEGATIVE
NITRITE: NEGATIVE
PH: 7.5 (ref 5.0–8.0)
Protein, ur: NEGATIVE mg/dL
SPECIFIC GRAVITY, URINE: 1.017 (ref 1.005–1.030)

## 2015-12-23 LAB — I-STAT ARTERIAL BLOOD GAS, ED
ACID-BASE EXCESS: 4 mmol/L — AB (ref 0.0–2.0)
BICARBONATE: 30 meq/L — AB (ref 20.0–24.0)
O2 SAT: 95 %
PO2 ART: 76 mmHg — AB (ref 80.0–100.0)
TCO2: 31 mmol/L (ref 0–100)
pCO2 arterial: 48.2 mmHg — ABNORMAL HIGH (ref 35.0–45.0)
pH, Arterial: 7.402 (ref 7.350–7.450)

## 2015-12-23 LAB — BLOOD GAS, ARTERIAL
Acid-Base Excess: 5.7 mmol/L — ABNORMAL HIGH (ref 0.0–2.0)
Bicarbonate: 30 mEq/L — ABNORMAL HIGH (ref 20.0–24.0)
Delivery systems: POSITIVE
Drawn by: 398981
Expiratory PAP: 6
FIO2: 0.4
Inspiratory PAP: 12
O2 Saturation: 98 %
Patient temperature: 98.6
RATE: 10 resp/min
TCO2: 31.4 mmol/L (ref 0–100)
pCO2 arterial: 46.3 mmHg — ABNORMAL HIGH (ref 35.0–45.0)
pH, Arterial: 7.427 (ref 7.350–7.450)
pO2, Arterial: 109 mmHg — ABNORMAL HIGH (ref 80.0–100.0)

## 2015-12-23 LAB — CBC WITH DIFFERENTIAL/PLATELET
Basophils Absolute: 0 10*3/uL (ref 0.0–0.1)
Basophils Absolute: 0 10*3/uL (ref 0.0–0.1)
Basophils Relative: 0 %
Basophils Relative: 1 %
Eosinophils Absolute: 0.2 10*3/uL (ref 0.0–0.7)
Eosinophils Absolute: 0 10*3/uL (ref 0.0–0.7)
Eosinophils Relative: 2 %
Eosinophils Relative: 1 %
HCT: 30.1 % — ABNORMAL LOW (ref 39.0–52.0)
HCT: 27.2 % — ABNORMAL LOW (ref 39.0–52.0)
HEMOGLOBIN: 9.6 g/dL — AB (ref 13.0–17.0)
Hemoglobin: 9 g/dL — ABNORMAL LOW (ref 13.0–17.0)
LYMPHS ABS: 1 10*3/uL (ref 0.7–4.0)
Lymphocytes Relative: 11 %
Lymphocytes Relative: 12 %
Lymphs Abs: 1.1 10*3/uL (ref 0.7–4.0)
MCH: 32.3 pg (ref 26.0–34.0)
MCH: 33.3 pg (ref 26.0–34.0)
MCHC: 31.9 g/dL (ref 30.0–36.0)
MCHC: 33.1 g/dL (ref 30.0–36.0)
MCV: 101.3 fL — AB (ref 78.0–100.0)
MCV: 100.7 fL — ABNORMAL HIGH (ref 78.0–100.0)
MONOS PCT: 9 %
Monocytes Absolute: 0.9 10*3/uL (ref 0.1–1.0)
Monocytes Absolute: 0.9 10*3/uL (ref 0.1–1.0)
Monocytes Relative: 10 %
NEUTROS ABS: 7.6 10*3/uL (ref 1.7–7.7)
NEUTROS PCT: 78 %
Neutro Abs: 6.6 10*3/uL (ref 1.7–7.7)
Neutrophils Relative %: 76 %
Platelets: 325 10*3/uL (ref 150–400)
Platelets: 260 10*3/uL (ref 150–400)
RBC: 2.97 MIL/uL — ABNORMAL LOW (ref 4.22–5.81)
RBC: 2.7 MIL/uL — ABNORMAL LOW (ref 4.22–5.81)
RDW: 15.3 % (ref 11.5–15.5)
RDW: 15.5 % (ref 11.5–15.5)
WBC: 9.7 10*3/uL (ref 4.0–10.5)
WBC: 8.6 10*3/uL (ref 4.0–10.5)

## 2015-12-23 LAB — CBG MONITORING, ED
GLUCOSE-CAPILLARY: 40 mg/dL — AB (ref 65–99)
GLUCOSE-CAPILLARY: 206 mg/dL — AB (ref 65–99)
Glucose-Capillary: 68 mg/dL (ref 65–99)
Glucose-Capillary: 78 mg/dL (ref 65–99)

## 2015-12-23 LAB — COMPREHENSIVE METABOLIC PANEL
ALBUMIN: 2.7 g/dL — AB (ref 3.5–5.0)
ALT: 18 U/L (ref 17–63)
ALT: 18 U/L (ref 17–63)
ANION GAP: 9 (ref 5–15)
AST: 24 U/L (ref 15–41)
AST: 23 U/L (ref 15–41)
Albumin: 2.3 g/dL — ABNORMAL LOW (ref 3.5–5.0)
Alkaline Phosphatase: 52 U/L (ref 38–126)
Alkaline Phosphatase: 41 U/L (ref 38–126)
Anion gap: 8 (ref 5–15)
BUN: 36 mg/dL — ABNORMAL HIGH (ref 6–20)
BUN: 31 mg/dL — ABNORMAL HIGH (ref 6–20)
CO2: 33 mmol/L — AB (ref 22–32)
CO2: 31 mmol/L (ref 22–32)
Calcium: 9 mg/dL (ref 8.9–10.3)
Calcium: 8.4 mg/dL — ABNORMAL LOW (ref 8.9–10.3)
Chloride: 100 mmol/L — ABNORMAL LOW (ref 101–111)
Chloride: 104 mmol/L (ref 101–111)
Creatinine, Ser: 1.59 mg/dL — ABNORMAL HIGH (ref 0.61–1.24)
Creatinine, Ser: 1.47 mg/dL — ABNORMAL HIGH (ref 0.61–1.24)
GFR calc Af Amer: 44 mL/min — ABNORMAL LOW (ref >60)
GFR calc Af Amer: 48 mL/min — ABNORMAL LOW (ref >60)
GFR calc non Af Amer: 38 mL/min — ABNORMAL LOW (ref >60)
GFR calc non Af Amer: 42 mL/min — ABNORMAL LOW (ref >60)
GLUCOSE: 72 mg/dL (ref 65–99)
Glucose, Bld: 119 mg/dL — ABNORMAL HIGH (ref 65–99)
POTASSIUM: 4.7 mmol/L (ref 3.5–5.1)
Potassium: 4.1 mmol/L (ref 3.5–5.1)
SODIUM: 142 mmol/L (ref 135–145)
Sodium: 143 mmol/L (ref 135–145)
Total Bilirubin: 0.4 mg/dL (ref 0.3–1.2)
Total Bilirubin: 0.6 mg/dL (ref 0.3–1.2)
Total Protein: 5.8 g/dL — ABNORMAL LOW (ref 6.5–8.1)
Total Protein: 5.1 g/dL — ABNORMAL LOW (ref 6.5–8.1)

## 2015-12-23 LAB — I-STAT VENOUS BLOOD GAS, ED
Acid-Base Excess: 10 mmol/L — ABNORMAL HIGH (ref 0.0–2.0)
Bicarbonate: 35.9 mEq/L — ABNORMAL HIGH (ref 20.0–24.0)
O2 Saturation: 43 %
PCO2 VEN: 56.3 mmHg — AB (ref 45.0–50.0)
PH VEN: 7.412 — AB (ref 7.250–7.300)
TCO2: 38 mmol/L (ref 0–100)
pO2, Ven: 25 mmHg — CL (ref 30.0–45.0)

## 2015-12-23 LAB — BRAIN NATRIURETIC PEPTIDE: B NATRIURETIC PEPTIDE 5: 460.5 pg/mL — AB (ref 0.0–100.0)

## 2015-12-23 LAB — GLUCOSE, CAPILLARY
GLUCOSE-CAPILLARY: 155 mg/dL — AB (ref 65–99)
Glucose-Capillary: 134 mg/dL — ABNORMAL HIGH (ref 65–99)
Glucose-Capillary: 124 mg/dL — ABNORMAL HIGH (ref 65–99)
Glucose-Capillary: 105 mg/dL — ABNORMAL HIGH (ref 65–99)
Glucose-Capillary: 88 mg/dL (ref 65–99)

## 2015-12-23 LAB — PROCALCITONIN: Procalcitonin: <0.1 ng/mL

## 2015-12-23 LAB — MRSA PCR SCREENING: MRSA BY PCR: NEGATIVE

## 2015-12-23 MED ORDER — RANOLAZINE ER 500 MG PO TB12
1000.0000 mg | ORAL_TABLET | Freq: Two times a day (BID) | ORAL | Status: DC
  Administered 2015-12-24 – 2015-12-29 (×11): 1000 mg via ORAL
  Filled 2015-12-23 (×12): qty 2

## 2015-12-23 MED ORDER — BISACODYL 10 MG RE SUPP: 10.0000 mg | Freq: Every day | RECTAL | Status: DC | PRN

## 2015-12-23 MED ORDER — HEPARIN SODIUM (PORCINE) 5000 UNIT/ML IJ SOLN
5000.0000 [IU] | Freq: Three times a day (TID) | INTRAMUSCULAR | Status: DC
  Administered 2015-12-23 – 2015-12-29 (×18): 5000 [IU] via SUBCUTANEOUS
  Filled 2015-12-23 (×16): qty 1

## 2015-12-23 MED ORDER — IPRATROPIUM-ALBUTEROL 0.5-2.5 (3) MG/3ML IN SOLN
6.0000 mL | RESPIRATORY_TRACT | Status: DC
  Administered 2015-12-23: 6 mL via RESPIRATORY_TRACT
  Filled 2015-12-23: qty 6

## 2015-12-23 MED ORDER — ALBUTEROL SULFATE (2.5 MG/3ML) 0.083% IN NEBU: 5.0000 mg | INHALATION_SOLUTION | Freq: Once | RESPIRATORY_TRACT | Status: DC

## 2015-12-23 MED ORDER — TRAMADOL HCL 50 MG PO TABS: 50.0000 mg | ORAL_TABLET | Freq: Four times a day (QID) | ORAL | Status: DC | PRN

## 2015-12-23 MED ORDER — ASPIRIN EC 81 MG PO TBEC
81.0000 mg | DELAYED_RELEASE_TABLET | Freq: Every day | ORAL | Status: DC
  Administered 2015-12-24 – 2015-12-29 (×6): 81 mg via ORAL
  Filled 2015-12-23 (×7): qty 1

## 2015-12-23 MED ORDER — ONDANSETRON HCL 4 MG PO TABS: 4.0000 mg | ORAL_TABLET | Freq: Four times a day (QID) | ORAL | Status: DC | PRN

## 2015-12-23 MED ORDER — VANCOMYCIN HCL 10 G IV SOLR
1500.0000 mg | Freq: Once | INTRAVENOUS | Status: AC
  Administered 2015-12-23: 1500 mg via INTRAVENOUS
  Filled 2015-12-23: qty 1500

## 2015-12-23 MED ORDER — ONDANSETRON HCL 4 MG/2ML IJ SOLN: 4.0000 mg | Freq: Four times a day (QID) | INTRAMUSCULAR | Status: DC | PRN

## 2015-12-23 MED ORDER — SODIUM CHLORIDE 0.9% FLUSH: 3.0000 mL | INTRAVENOUS | Status: DC | PRN

## 2015-12-23 MED ORDER — INSULIN ASPART 100 UNIT/ML ~~LOC~~ SOLN
0.0000 [IU] | Freq: Three times a day (TID) | SUBCUTANEOUS | Status: DC
  Administered 2015-12-24 (×2): 3 [IU] via SUBCUTANEOUS
  Administered 2015-12-25: 2 [IU] via SUBCUTANEOUS
  Administered 2015-12-25: 3 [IU] via SUBCUTANEOUS
  Administered 2015-12-25: 1 [IU] via SUBCUTANEOUS
  Administered 2015-12-26: 5 [IU] via SUBCUTANEOUS
  Administered 2015-12-26 – 2015-12-27 (×3): 3 [IU] via SUBCUTANEOUS
  Administered 2015-12-28: 7 [IU] via SUBCUTANEOUS
  Administered 2015-12-28 (×2): 2 [IU] via SUBCUTANEOUS
  Administered 2015-12-29 (×2): 3 [IU] via SUBCUTANEOUS

## 2015-12-23 MED ORDER — CEFEPIME HCL 2 G IJ SOLR
2.0000 g | Freq: Once | INTRAMUSCULAR | Status: AC
  Administered 2015-12-23: 2 g via INTRAVENOUS
  Filled 2015-12-23: qty 2

## 2015-12-23 MED ORDER — SODIUM CHLORIDE 0.9 % IV BOLUS (SEPSIS)
1000.0000 mL | INTRAVENOUS | Status: DC
  Administered 2015-12-23 (×2): 1000 mL via INTRAVENOUS

## 2015-12-23 MED ORDER — ZOLPIDEM TARTRATE 5 MG PO TABS: 5.0000 mg | ORAL_TABLET | Freq: Every evening | ORAL | Status: DC | PRN

## 2015-12-23 MED ORDER — SIMVASTATIN 20 MG PO TABS
20.0000 mg | ORAL_TABLET | Freq: Every day | ORAL | Status: DC
  Administered 2015-12-24 – 2015-12-28 (×5): 20 mg via ORAL
  Filled 2015-12-23 (×5): qty 1

## 2015-12-23 MED ORDER — DEXTROSE 50 % IV SOLN
INTRAVENOUS | Status: AC
  Administered 2015-12-23: 50 mL
  Filled 2015-12-23: qty 50

## 2015-12-23 MED ORDER — FENOFIBRATE 160 MG PO TABS
160.0000 mg | ORAL_TABLET | Freq: Every day | ORAL | Status: DC
  Administered 2015-12-24 – 2015-12-29 (×6): 160 mg via ORAL
  Filled 2015-12-23 (×6): qty 1

## 2015-12-23 MED ORDER — DEXTROSE 5 % IV SOLN
1.0000 g | INTRAVENOUS | Status: DC
  Administered 2015-12-24 – 2015-12-29 (×6): 1 g via INTRAVENOUS
  Filled 2015-12-23 (×6): qty 1

## 2015-12-23 MED ORDER — POLYETHYLENE GLYCOL 3350 17 G PO PACK
17.0000 g | PACK | Freq: Every day | ORAL | Status: DC
Start: 2015-12-24 — End: 2015-12-29
  Administered 2015-12-24 – 2015-12-29 (×5): 17 g via ORAL
  Filled 2015-12-23 (×5): qty 1

## 2015-12-23 MED ORDER — IPRATROPIUM-ALBUTEROL 0.5-2.5 (3) MG/3ML IN SOLN
3.0000 mL | RESPIRATORY_TRACT | Status: DC
  Filled 2015-12-23: qty 3

## 2015-12-23 MED ORDER — INSULIN GLARGINE 100 UNIT/ML ~~LOC~~ SOLN: 20.0000 [IU] | Freq: Every day | SUBCUTANEOUS | Status: DC

## 2015-12-23 MED ORDER — SODIUM CHLORIDE 0.9% FLUSH
3.0000 mL | Freq: Two times a day (BID) | INTRAVENOUS | Status: DC
  Administered 2015-12-23: 3 mL via INTRAVENOUS

## 2015-12-23 MED ORDER — ISOSORBIDE MONONITRATE ER 30 MG PO TB24
15.0000 mg | ORAL_TABLET | Freq: Every day | ORAL | Status: DC
  Administered 2015-12-24 – 2015-12-29 (×6): 15 mg via ORAL
  Filled 2015-12-23 (×9): qty 1

## 2015-12-23 MED ORDER — IPRATROPIUM-ALBUTEROL 0.5-2.5 (3) MG/3ML IN SOLN
3.0000 mL | Freq: Three times a day (TID) | RESPIRATORY_TRACT | Status: DC
  Administered 2015-12-23 – 2015-12-28 (×14): 3 mL via RESPIRATORY_TRACT
  Filled 2015-12-23 (×14): qty 3

## 2015-12-23 MED ORDER — ACETAMINOPHEN 500 MG PO TABS
1000.0000 mg | ORAL_TABLET | Freq: Once | ORAL | Status: AC
  Administered 2015-12-23: 1000 mg via ORAL
  Filled 2015-12-23: qty 2

## 2015-12-23 MED ORDER — FUROSEMIDE 10 MG/ML IJ SOLN
40.0000 mg | Freq: Once | INTRAMUSCULAR | Status: AC
  Administered 2015-12-23: 40 mg via INTRAVENOUS
  Filled 2015-12-23: qty 4

## 2015-12-23 MED ORDER — GABAPENTIN 300 MG PO CAPS
300.0000 mg | ORAL_CAPSULE | Freq: Two times a day (BID) | ORAL | Status: DC
  Administered 2015-12-24 – 2015-12-29 (×11): 300 mg via ORAL
  Filled 2015-12-23 (×11): qty 1

## 2015-12-23 MED ORDER — FUROSEMIDE 10 MG/ML IJ SOLN
20.0000 mg | Freq: Every day | INTRAMUSCULAR | Status: DC
  Administered 2015-12-23 – 2015-12-24 (×2): 20 mg via INTRAVENOUS
  Filled 2015-12-23 (×3): qty 2

## 2015-12-23 MED ORDER — SODIUM CHLORIDE 0.9 % IV SOLN: 250.0000 mL | INTRAVENOUS | Status: DC | PRN

## 2015-12-23 MED ORDER — IOHEXOL 350 MG/ML SOLN
80.0000 mL | Freq: Once | INTRAVENOUS | Status: AC | PRN
  Administered 2015-12-23: 80 mL via INTRAVENOUS

## 2015-12-23 MED ORDER — PANTOPRAZOLE SODIUM 40 MG PO TBEC
80.0000 mg | DELAYED_RELEASE_TABLET | Freq: Every day | ORAL | Status: DC
  Administered 2015-12-24 – 2015-12-29 (×6): 80 mg via ORAL
  Filled 2015-12-23 (×8): qty 2

## 2015-12-23 MED ORDER — DEXTROSE-NACL 5-0.45 % IV SOLN
INTRAVENOUS | Status: DC
  Administered 2015-12-23: 22:00:00 via INTRAVENOUS

## 2015-12-23 MED ORDER — VANCOMYCIN HCL 10 G IV SOLR
1250.0000 mg | INTRAVENOUS | Status: DC
  Administered 2015-12-24: 1250 mg via INTRAVENOUS
  Filled 2015-12-23 (×2): qty 1250

## 2015-12-23 NOTE — ED Notes (Signed)
Attempted to call report

## 2015-12-23 NOTE — ED Notes (Signed)
Dr. Merrell MD at bedside. 

## 2015-12-23 NOTE — Progress Notes (Signed)
Patient bradycardic in 40's, still lethargic but arousablKonrad DoloresDr. Merrell paged and notified.  Orders received and asked to call oncall physician to call critical care consult.  Merdis Delay PA paged and notified.  Respiratory notified to obtain ABG now.  Colman Cater

## 2015-12-23 NOTE — Progress Notes (Signed)
Results of CXR called to MD. New orders received. Will continue to monitor.  Reginold Agent, RN

## 2015-12-23 NOTE — H&P (Signed)
[facility] History and Physical  [name] ZOX:[id number] DOB: March 19, 1928 DOA: 12/23/2015  Referring physician: Mora Bellman PCP: Gwen Pounds, MD   Chief Complaint: sob  HPI: [name] is a 82 y.o. male with a past medical history that includes hypertension, diabetes, CAD status post MI 4 weeks ago complicated by pneumonia, chronic kidney disease presents to the emergency department from home with the chief complaint of worsening shortness of breath and increased cough and generalized weakness. Initial evaluation in the emergency department reveals acute respiratory failure with hypoxia likely related to healthcare associated pneumonia  Information is obtained from the wife who is at the bedside. She reports her husband recently discharged from cardiac rehabilitation on 4 L of nasal cannula and did well until this morning. This morning when he was ambulating she noted increased dyspnea with exertion and generalized weakness. She denies any recent complaints of chest pain palpitation headache dizziness syncope or near-syncope. He denies any complaints of chills. She reports his appetite has been good he's been eating and drinking well. No diarrhea no vomiting. Reports some increase in lower extremity edema denies orthopnea.  In the emergency department patient provided with nebulizers antibiotics and IV fluids.   Review of Systems:  10 point review of systems complete and all systems are negative except as indicated in the history of present illness   Past Medical History  Diagnosis Date  . Hypertension   . Diabetes mellitus   . Dyslipidemia   . Coronary artery disease     status post CABG. He is also status post PTCA and stenting of the circumflex artery  . Heart murmur   . Renal calculi   . 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 Cataract   . Myocardial infarction (HCC)   . NSTEMI (non-ST elevated myocardial infarction) (HCC) 11/12/2015    Past Surgical History  Procedure Laterality Date  . Lithotripsy    . US echocardiography  01-16-09    EF 55-60  . Cardiovascular stress test  09-26-03    EF 43%  . Cardiac catheterization    . Coronary artery bypass graft      CABG X 4  . Coronary angioplasty with stent placement    . Tonsillectomy    . Cataract extraction w/ intraocular lens implant      right  . Back surgery      x6  . Total hip arthroplasty  10/29/2011    Procedure: TOTAL HIP ARTHROPLASTY;  Surgeon: Shelda Pal;  Location: [facility];  Service: Orthopedics;  Laterality: Right;  . Joint replacement    . Eye surgery    . Spine surgery    . Cardiac catheterization N/A 11/13/2015    Procedure: Left Heart Cath and Cors/Grafts Angiography;  Surgeon: Lennette Bihari, MD;  Location: [facility] INVASIVE CV LAB;  Service: Cardiovascular;  Laterality: N/A;   Social History:  reports that he has quit smoking. His smoking use included Pipe. He has never used smokeless tobacco. He reports that he does not drink alcohol or use illicit drugs.  Allergies  Allergen Reactions  . Morphine And Related     Altered mental status  . Tetracyclines & Related     "Raw throat"    Family History  Problem Relation Age of Onset  . Pneumonia Father     Deceased age 44     Prior to Admission medications   Medication Sig Start Date End Date Taking? Authorizing Provider  albuterol (PROVENTIL) (  2.5 MG/3ML) 0.083% nebulizer solution Take 3 mLs (2.5 mg total) by nebulization every 2 (two) hours as needed for wheezing or shortness of breath. 11/24/15  Yes Jessica U Vann, DO  amLODipine (NORVASC) 5 MG tablet Take 1 tablet (5 mg total) by mouth daily. 11/24/15  Yes Joseph Art, DO  aspirin EC 81 MG tablet Take 1 tablet (81 mg total) by mouth daily. 07/30/14  Yes Vesta Mixer, MD  atenolol (TENORMIN) 50 MG tablet TAKE 1 TABLET BY MOUTH DAILY 06/05/15  Yes Vesta Mixer, MD  bisacodyl (DULCOLAX) 10 MG suppository Place 1 suppository (10 mg total)  rectally daily as needed for moderate constipation. 11/24/15  Yes Joseph Art, DO  colchicine 0.6 MG tablet Take 1 tablet (0.6 mg total) by mouth daily. For 3 days 11/24/15  Yes Joseph Art, DO  esomeprazole (NEXIUM) 40 MG capsule Take 40 mg by mouth daily before breakfast.     Yes Historical Provider, MD  fenofibrate 160 MG tablet Take 160 mg by mouth daily.     Yes Historical Provider, MD  furosemide (LASIX) 20 MG tablet Take 0.5 tablets by mouth daily. 10/30/15  Yes Historical Provider, MD  gabapentin (NEURONTIN) 300 MG capsule Take 300 mg by mouth 2 (two) times daily.    Yes Historical Provider, MD  Insulin Glargine (LANTUS Bradley) Inject 40 Units into the skin at bedtime.    Yes Historical Provider, MD  insulin lispro (HUMALOG KWIKPEN) 100 UNIT/ML KiwkPen Inject 10 Units into the skin 3 (three) times daily. Sliding scale   Yes Historical Provider, MD  ipratropium-albuterol (DUONEB) 0.5-2.5 (3) MG/3ML SOLN Take 3 mLs by nebulization every 4 (four) hours as needed (sob wheezing). 11/24/15  Yes Joseph Art, DO  isosorbide mononitrate (IMDUR) 30 MG 24 hr tablet TAKE 1 TABLET (30 MG TOTAL) BY MOUTH DAILY. 12/03/15  Yes Vesta Mixer, MD  nystatin (MYCOSTATIN) 100000 UNIT/ML suspension Take 5 mLs (500,000 Units total) by mouth 4 (four) times daily. 11/24/15  Yes Joseph Art, DO  polyethylene glycol (MIRALAX / GLYCOLAX) packet Take 17 g by mouth daily. 11/24/15  Yes Joseph Art, DO  predniSONE (DELTASONE) 50 MG tablet Take 1 tablet (50 mg total) by mouth daily with breakfast. 11/24/15  Yes Jessica U Vann, DO  RANEXA 1000 MG SR tablet TAKE 1 TABLET BY MOUTH TWICE A DAY 08/12/15  Yes Vesta Mixer, MD  simvastatin (ZOCOR) 20 MG tablet TAKE 1 TABLET BY MOUTH AT BEDTIME 07/29/15  Yes Vesta Mixer, MD  traMADol (ULTRAM) 50 MG tablet Take 1 tablet (50 mg total) by mouth every 6 (six) hours as needed for moderate pain. 11/24/15  Yes Joseph Art, DO  zolpidem (AMBIEN) 5 MG tablet Take 1 tablet (5 mg total)  by mouth at bedtime as needed for sleep. 11/24/15  Yes Joseph Art, DO  clopidogrel (PLAVIX) 75 MG tablet Take 1 tablet (75 mg total) by mouth daily. 11/24/15   Joseph Art, DO  insulin aspart (NOVOLOG) 100 UNIT/ML injection Inject 0-5 Units into the skin at bedtime. 11/24/15   Joseph Art, DO  insulin aspart (NOVOLOG) 100 UNIT/ML injection Inject 0-9 Units into the skin 3 (three) times daily with meals. 11/24/15   Joseph Art, DO  insulin aspart (NOVOLOG) 100 UNIT/ML injection Inject 10 Units into the skin 3 (three) times daily with meals. 11/24/15   Joseph Art, DO   Physical Exam: Filed Vitals:   12/23/15 1234 12/23/15  1238 12/23/15 1243 12/23/15 1245  BP: 138/62   135/66  Pulse: 75   76  Temp:      TempSrc:      Resp: 19   21  Weight:      SpO2: 100% 100% 94% 94%    Wt Readings from Last 3 Encounters:  12/23/15 91.315 kg (201 lb 5 oz)  11/24/15 86.1 kg (189 lb 13.1 oz)  11/07/15 90.719 kg (200 lb)    General:  Appears calm and comfortable somewhat lethargic somewhat ill appearing, slightly pale Eyes: PERRL, normal lids, irises & conjunctiva ENT: grossly normal hearing, he does membranes of his mouth slightly pale slightly dry Neck: no LAD, masses or thyromegaly Cardiovascular: RRR, no m/r/g. 1+ pitting LE edema.  Respiratory:  Normal respiratory effort but shallow. Breath sounds are distant. Faint end-expiratory wheeze throughout. Fine crackles in bases Abdomen: soft, ntnd positive bowel sounds Skin: no rash or induration seen on limited exam Musculoskeletal: grossly normal tone BUE/BLE Psychiatric: grossly normal mood and affect, speech fluent and appropriate Neurologic: grossly non-focal. Speech clear facial symmetry           Labs on Admission:  Basic Metabolic Panel:  Recent Labs Lab 12/23/15 1050  NA 142  K 4.7  CL 100*  CO2 33*  GLUCOSE 72  BUN 36*  CREATININE 1.59*  CALCIUM 9.0   Liver Function Tests:  Recent Labs Lab 12/23/15 1050  AST 24    ALT 18  ALKPHOS 52  BILITOT 0.4  PROT 5.8*  ALBUMIN 2.7*   No results for input(s): LIPASE, AMYLASE in the last 168 hours. No results for input(s): AMMONIA in the last 168 hours. CBC:  Recent Labs Lab 12/23/15 1050  WBC 9.7  NEUTROABS 7.6  HGB 9.6*  HCT 30.1*  MCV 101.3*  PLT 325   Cardiac Enzymes: No results for input(s): CKTOTAL, CKMB, CKMBINDEX, TROPONINI in the last 168 hours.  BNP (last 3 results)  Recent Labs  11/11/15 2227 11/20/15 1325  BNP 414.8* 1407.2*    ProBNP (last 3 results) No results for input(s): PROBNP in the last 8760 hours.  CBG: No results for input(s): GLUCAP in the last 168 hours.  Radiological Exams on Admission: Ct Angio Chest Pe W/cm &/or Wo Cm  12/23/2015  CLINICAL DATA:  Hypoxia, evaluate for pulmonary embolus, pneumonia EXAM: CT ANGIOGRAPHY CHEST WITH CONTRAST TECHNIQUE: Multidetector CT imaging of the chest was performed using the standard protocol during bolus administration of intravenous contrast. Multiplanar CT image reconstructions and MIPs were obtained to evaluate the vascular anatomy. CONTRAST:  80mL OMNIPAQUE IOHEXOL 350 MG/ML SOLN COMPARISON:  Chest x-ray same day and CT chest 03/05/2009 FINDINGS: Images of the thoracic inlet shows 1.2 cm low-density nodule left lobe thyroid gland. On the prior exam this measures 8 mm. A right pretracheal lymph node measures 1 cm short-axis borderline by size criteria. A lower pretracheal/precarinal lymph node measures 1.8 x 1 cm. AP window lymph node measures 1.5 cm by short-axis. A left mediastinal lymph node just lateral to pulmonary artery measures 1.6 cm by 1.3 cm. No significant hilar adenopathy. The study is of excellent technical quality. No pulmonary emboli are identified. The visualized upper abdomen shows calcified gallstone in dependent gallbladder measures 1.4 cm. No adrenal gland mass is noted in visualized upper abdomen. Mild bilateral perihilar peribronchial thickening. There is patchy  peripheral infiltrate in left upper lobe and left lower lobe. Findings suspicious for multifocal bronchopneumonia. Mild peripheral interstitial prominence bilateral upper lobe suspicious for peripheral  interstitial edema or interstitial pneumonia . There is small right pleural effusion. Right basilar atelectasis or infiltrate is noted. The patient is status post median sternotomy and CABG. Sagittal images of the spine shows diffuse osteopenia and mild degenerative changes thoracic spine. Review of the MIP images confirms the above findings. IMPRESSION: 1. No pulmonary embolus is noted. 2. Patchy infiltrate/pneumonia noted in left upper lobe and left lower lobe. Mild peripheral interstitial prominence bilateral upper lobe and lingula suspicious for superimposed mild interstitial edema or pneumonitis. There is small right pleural effusion. Right base posterior atelectasis or infiltrate. 3. Borderline enlarged mediastinal lymph nodes probable reactive. 4. No aortic aneurysm. 5. Calcified gallstone within gallbladder measures 1.4 cm. 6. Mild perihilar bronchitic changes. 7. Status post CABG. Electronically Signed   By: Natasha Mead M.D.   On: 12/23/2015 12:36   Dg Chest Portable 1 View  12/23/2015  CLINICAL DATA:  Shortness of breath, fever EXAM: PORTABLE CHEST 1 VIEW COMPARISON:  11/22/2015 FINDINGS: Cardiomegaly again noted. Status post CABG. Chronic elevation of the right hemidiaphragm. There is mild interstitial prominence and hazy airspace opacification in left lung suspicious for asymmetric edema or pneumonitis. Mild right perihilar streaky atelectasis or infiltrate. IMPRESSION: There is mild interstitial prominence and hazy airspace opacification in left lung suspicious for asymmetric edema or pneumonitis. Mild right perihilar streaky atelectasis or infiltrate. Electronically Signed   By: Natasha Mead M.D.   On: 12/23/2015 10:48    EKG: Independently reviewed. Sinus rhythm  Assessment/Plan Principal  Problem:   Acute respiratory failure with hypoxia (HCC) Active Problems:   CAD (coronary artery disease)   Essential hypertension   Diabetes mellitus with neurological manifestation (HCC)   Dyslipidemia   CHF (congestive heart failure) (HCC)   CKD (chronic kidney disease), stage III   Chronic diastolic heart failure (HCC)   HCAP (healthcare-associated pneumonia)   Anemia  #1. Acute on chronic respiratory failure with hypoxia secondary to healthcare associated pneumonia in the setting of mild acute on chronic diastolic heart failure. Oxygen saturation level 78 on 4 L nasal cannula in the emergency department. Chest x-ray and CT NGO the chest and cystoscopy with pneumonia. Initial lactic acid 1.3. No leukocytosis. Max temperature 100.2 rectally. Pitting edema in lower extremities.  Hemodynamically stable. Appears somewhat acutely ill. Of note patient discharged from snf on home oxygen and prednisone taper. -Admit to step down -Blood cultures -Antibiotics per protocol (vanc and cefepime) -Obtain a BNP -IV Lasix 20 mg -Daily weights -Intake and output -Oxygen supplementation wean back to nasal cannula as able -Nebulizers every 4 hours  #2. Healthcare associated pneumonia. Per CT  chest and x-ray. See #1. Lactic acid 1.3.  -Blood cultures -Strep pneumo urine antigen -Legionella urine antigen -Sputum culture as available -Antibiotics as above -Obtain pro-calcitonin  3. Acute on chronic diastolic heart failure. Echo done last month revealed EF of 55% hypokinesis. LE edema on exam. Medications include 10 mg a Lasix -We'll provide IV Lasix 20 mg daily -Obtained BNP -Daily weights -2 urine output  #4. Acute kidney disease stage III.creatinine on admission 1.59. Appears to be at his baseline. Related to chronic disease -Monitor urine output  #5. Diabetes. Serum glucose 72 on admission. On Lantus and NovoLog at home [facility] obtain a hemoglobin A1c -Provide Lantus at half his home  dose -Sliding scale insulin for optimal control -May need meal coverage as his appetite is good  #6 Hypertension. Controlled emergency department. Home medications include amlodipine, atenolol, Lasix, imdur  -We'll continue imdur -The Lasix as  noted above -Holding amlodipine and atenolol for now  #7. CAD. Recent MI and cath. No chest pain. Chart review indicates 2 vessel CAD status post CABG in the past with recent cardiac cath demonstrating both native vessel and graft disease felt to be best managed medically -Monitor on telemetry -Continue medications as noted above    Code Status: full DVT Prophylaxis: Family Communication: wife at bedside Disposition Plan: home when ready  Time spent: 75 minutes  [facility] M [facility]

## 2015-12-23 NOTE — ED Notes (Addendum)
CBG 68  Dr. Konrad Dolores made aware.  New order received for 1 amp D50.

## 2015-12-23 NOTE — ED Notes (Signed)
Patient transported to CT 

## 2015-12-23 NOTE — ED Notes (Signed)
Pt arrives from home via [facility] reporting SOB worse over past few days.  EMS reports pt intially 80% on 4L.  EMS reports pt became SOB and weak while ambulating at home, lowered self to floor, denies fall or injury.  EMS reports pt had abrasions to bilat knees while trying to get up.   EMS reports pt recently discharged from cardiac rehab post MI.  Initial sats on 4L 78%, pt placed on NRB at 10L, satting 100%, RR 18.

## 2015-12-23 NOTE — Progress Notes (Signed)
Pharmacy Antibiotic Note  DARLENE BARTELT is a 82 y.o. male admitted on 12/23/2015 with pneumonia.  Pharmacy has been consulted for vancomycin dosing. Tmax is 100.2 and WBC is WNL. Lactic acid is 1.3 and SCr is 1.59. Initial doses already administered.   Plan: - Vancomycin  IV Q24H - F/u renal fxn, C&S, clinical status and trough at SS  Weight: 201 lb 5 oz (91.315 kg)  Temp (24hrs), Avg:99.5 F (37.5 C), Min:98.8 F (37.1 C), Max:100.2 F (37.9 C)   Recent Labs Lab 12/23/15 1050 12/23/15 1103  WBC 9.7  --   CREATININE 1.59*  --   LATICACIDVEN  --  1.30    Estimated Creatinine Clearance: 37.9 mL/min (by C-G formula based on Cr of 1.59).    Allergies  Allergen Reactions  . Morphine And Related     Altered mental status  . Tetracyclines & Related     "Raw throat"    Antimicrobials this admission: Vanc 1/31>> Cefepime 1/31>>  Dose adjustments this admission: N/A  Microbiology results:   Thank you for allowing pharmacy to be a part of this patient's care.  Javarian Jakubiak, Drake Leach 12/23/2015 12:44 PM

## 2015-12-23 NOTE — Progress Notes (Signed)
Pt admitted to unit from ED.  Upon assessment, pt extremely hard to arouse but became arousable with sternal rub.  Pt is a&o x4, VSS but rectal temp 96.8 and FSBS 155.  MD notified and new orders received, will continue to closely monitor.

## 2015-12-23 NOTE — ED Notes (Signed)
CBG 40.  Pt awake and responding to speech.  Pt given 8 oz. Orange juice.  CBG to be ressessed in 15 minutes.  D50 at bedside.

## 2015-12-23 NOTE — ED Provider Notes (Signed)
CSN: [id number]     Arrival date & time 12/23/15  1016 History   First MD Initiated Contact with Patient 12/23/15 1030     Chief Complaint  Patient presents with  . Shortness of Breath     (Consider location/radiation/quality/duration/timing/severity/associated sxs/prior Treatment) HPI  [name] is a 82yo male, PMH HTN, DM, CAD, here with SOB and hypoxia.  Patient was recently DC'ed for cardiac rehab on 4L [place].  Per EMS, he was 78% on 4L [place].  Patient can not give his own history due to acuity of condition.  He denies CP but states he has had a cough.  He states he has not been on a blood thinner.  He has no further complaints.   Past Medical History  Diagnosis Date  . Hypertension   . Diabetes mellitus   . Dyslipidemia   . Coronary artery disease     status post CABG. He is also status post PTCA and stenting of the circumflex artery  . Heart murmur   . Renal calculi   . 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 Cataract   . Myocardial infarction (HCC)   . NSTEMI (non-ST elevated myocardial infarction) (HCC) 11/12/2015   Past Surgical History  Procedure Laterality Date  . Lithotripsy    . US echocardiography  01-16-09    EF 55-60  . Cardiovascular stress test  09-26-03    EF 43%  . Cardiac catheterization    . Coronary artery bypass graft      CABG X 4  . Coronary angioplasty with stent placement    . Tonsillectomy    . Cataract extraction w/ intraocular lens implant      right  . Back surgery      x6  . Total hip arthroplasty  10/29/2011    Procedure: TOTAL HIP ARTHROPLASTY;  Surgeon: Shelda Pal;  Location: [facility];  Service: Orthopedics;  Laterality: Right;  . Joint replacement    . Eye surgery    . Spine surgery    . Cardiac catheterization N/A 11/13/2015    Procedure: Left Heart Cath and Cors/Grafts Angiography;  Surgeon: Lennette Bihari, MD;  Location: [facility] INVASIVE CV LAB;  Service: Cardiovascular;  Laterality: N/A;   Family History   Problem Relation Age of Onset  . Pneumonia Father     Deceased age 79   Social History  Substance Use Topics  . Smoking status: Former Smoker    Types: Pipe  . Smokeless tobacco: Never Used  . Alcohol Use: No    Review of Systems  Unable to perform ROS: Acuity of condition      Allergies  Morphine and related and Tetracyclines & related  Home Medications   Prior to Admission medications   Medication Sig Start Date End Date Taking? Authorizing Provider  albuterol (PROVENTIL) (2.5 MG/3ML) 0.083% nebulizer solution Take 3 mLs (2.5 mg total) by nebulization every 2 (two) hours as needed for wheezing or shortness of breath. 11/24/15   Dylan Art, DO  amLODipine (NORVASC) 5 MG tablet Take 1 tablet (5 mg total) by mouth daily. 11/24/15   Dylan Art, DO  aspirin EC 81 MG tablet Take 1 tablet (81 mg total) by mouth daily. Patient not taking: Reported on 11/07/2015 07/30/14   Vesta Mixer, MD  atenolol (TENORMIN) 50 MG tablet TAKE 1 TABLET BY MOUTH DAILY 06/05/15   Vesta Mixer, MD  benzonatate (TESSALON) 100 MG capsule Take 1 capsule (100  mg total) by mouth 3 (three) times daily. 11/24/15   Dylan Art, DO  bisacodyl (DULCOLAX) 10 MG suppository Place 1 suppository (10 mg total) rectally daily as needed for moderate constipation. 11/24/15   Dylan Art, DO  clopidogrel (PLAVIX) 75 MG tablet Take 1 tablet (75 mg total) by mouth daily. 11/24/15   Dylan Art, DO  colchicine 0.6 MG tablet Take 1 tablet (0.6 mg total) by mouth daily. For 3 days 11/24/15   Dylan Art, DO  dextromethorphan (DELSYM) 30 MG/5ML liquid Take 2.5 mLs (15 mg total) by mouth 2 (two) times daily. 11/24/15   Dylan Art, DO  esomeprazole (NEXIUM) 40 MG capsule Take 40 mg by mouth daily before breakfast.      Historical Provider, MD  fenofibrate 160 MG tablet Take 160 mg by mouth daily.      Historical Provider, MD  furosemide (LASIX) 20 MG tablet Take 0.5 tablets by mouth daily. 10/30/15   Historical  Provider, MD  gabapentin (NEURONTIN) 300 MG capsule Take 300 mg by mouth 2 (two) times daily.     Historical Provider, MD  insulin aspart (NOVOLOG) 100 UNIT/ML injection Inject 0-5 Units into the skin at bedtime. 11/24/15   Dylan Art, DO  insulin aspart (NOVOLOG) 100 UNIT/ML injection Inject 0-9 Units into the skin 3 (three) times daily with meals. 11/24/15   Dylan Art, DO  insulin aspart (NOVOLOG) 100 UNIT/ML injection Inject 10 Units into the skin 3 (three) times daily with meals. 11/24/15   Dylan Art, DO  Insulin Glargine (LANTUS Vantage) Inject 40 Units into the skin at bedtime.     Historical Provider, MD  ipratropium-albuterol (DUONEB) 0.5-2.5 (3) MG/3ML SOLN Take 3 mLs by nebulization every 4 (four) hours as needed (sob wheezing). 11/24/15   Dylan Art, DO  isosorbide mononitrate (IMDUR) 30 MG 24 hr tablet TAKE 1 TABLET (30 MG TOTAL) BY MOUTH DAILY. 12/03/15   Vesta Mixer, MD  nystatin (MYCOSTATIN) 100000 UNIT/ML suspension Take 5 mLs (500,000 Units total) by mouth 4 (four) times daily. 11/24/15   Dylan Art, DO  polyethylene glycol (MIRALAX / GLYCOLAX) packet Take 17 g by mouth daily. 11/24/15   Dylan Art, DO  predniSONE (DELTASONE) 50 MG tablet Take 1 tablet (50 mg total) by mouth daily with breakfast. 11/24/15   Dylan Art, DO  RANEXA 1000 MG SR tablet TAKE 1 TABLET BY MOUTH TWICE A DAY 08/12/15   Vesta Mixer, MD  simvastatin (ZOCOR) 20 MG tablet TAKE 1 TABLET BY MOUTH AT BEDTIME 07/29/15   Vesta Mixer, MD  traMADol (ULTRAM) 50 MG tablet Take 1 tablet (50 mg total) by mouth every 6 (six) hours as needed for moderate pain. 11/24/15   Dylan Art, DO  zolpidem (AMBIEN) 5 MG tablet Take 1 tablet (5 mg total) by mouth at bedtime as needed for sleep. 11/24/15   Dylan Art, DO   BP 106/87 mmHg  Pulse 69  Temp(Src) 98.8 F (37.1 C) (Axillary)  Resp 18  SpO2 78% Physical Exam  Constitutional: Vital signs are normal. He appears well-developed and well-nourished.   Non-toxic appearance. He does not appear ill. No distress.  HENT:  Head: Normocephalic and atraumatic.  Nose: Nose normal.  Mouth/Throat: Oropharynx is clear and moist. No oropharyngeal exudate.  Eyes: Conjunctivae and EOM are normal. Pupils are equal, round, and reactive to light. No scleral icterus.  Neck: Normal range of motion. Neck supple. No  tracheal deviation, no edema, no erythema and normal range of motion present. No thyroid mass and no thyromegaly present.  Cardiovascular: Normal rate, regular rhythm, S1 normal, S2 normal, normal heart sounds, intact distal pulses and normal pulses.  Exam reveals no gallop and no friction rub.   No murmur heard. Pulmonary/Chest: Breath sounds normal. He is in respiratory distress. He has no wheezes. He has no rhonchi. He has no rales.  Tachypnea, use of accessory muscles  Abdominal: Soft. Normal appearance and bowel sounds are normal. He exhibits no distension, no ascites and no mass. There is no hepatosplenomegaly. There is no tenderness. There is no rebound, no guarding and no CVA tenderness.  Musculoskeletal: Normal range of motion. He exhibits no edema or tenderness.  Lymphadenopathy:    He has no cervical adenopathy.  Neurological: He is alert. He has normal strength. No sensory deficit. He exhibits normal muscle tone.  Skin: Skin is warm, dry and intact. No petechiae and no rash noted. He is not diaphoretic. No erythema. No pallor.  Psychiatric: He has a normal mood and affect. His behavior is normal. Judgment normal.  Nursing note and vitals reviewed.   ED Course  Procedures (including critical care time) Labs Review Labs Reviewed  COMPREHENSIVE METABOLIC PANEL - Abnormal; Notable for the following:    Chloride 100 (*)    CO2 33 (*)    BUN 36 (*)    Creatinine, Ser 1.59 (*)    Total Protein 5.8 (*)    Albumin 2.7 (*)    GFR calc non Af Amer 38 (*)    GFR calc Af Amer 44 (*)    All other components within normal limits  CBC WITH  DIFFERENTIAL/PLATELET - Abnormal; Notable for the following:    RBC 2.97 (*)    Hemoglobin 9.6 (*)    HCT 30.1 (*)    MCV 101.3 (*)    All other components within normal limits  I-STAT VENOUS BLOOD GAS, ED - Abnormal; Notable for the following:    pH, Ven 7.412 (*)    pCO2, Ven 56.3 (*)    pO2, Ven 25.0 (*)    Bicarbonate 35.9 (*)    Acid-Base Excess 10.0 (*)    All other components within normal limits  CULTURE, BLOOD (ROUTINE X 2)  CULTURE, BLOOD (ROUTINE X 2)  URINE CULTURE  URINALYSIS, ROUTINE W REFLEX MICROSCOPIC (NOT AT [facility])  I-STAT CG4 LACTIC ACID, ED    Imaging Review Ct Angio Chest Pe W/cm &/or Wo Cm  12/23/2015  CLINICAL DATA:  Hypoxia, evaluate for pulmonary embolus, pneumonia EXAM: CT ANGIOGRAPHY CHEST WITH CONTRAST TECHNIQUE: Multidetector CT imaging of the chest was performed using the standard protocol during bolus administration of intravenous contrast. Multiplanar CT image reconstructions and MIPs were obtained to evaluate the vascular anatomy. CONTRAST:  80mL OMNIPAQUE IOHEXOL 350 MG/ML SOLN COMPARISON:  Chest x-ray same day and CT chest 03/05/2009 FINDINGS: Images of the thoracic inlet shows 1.2 cm low-density nodule left lobe thyroid gland. On the prior exam this measures 8 mm. A right pretracheal lymph node measures 1 cm short-axis borderline by size criteria. A lower pretracheal/precarinal lymph node measures 1.8 x 1 cm. AP window lymph node measures 1.5 cm by short-axis. A left mediastinal lymph node just lateral to pulmonary artery measures 1.6 cm by 1.3 cm. No significant hilar adenopathy. The study is of excellent technical quality. No pulmonary emboli are identified. The visualized upper abdomen shows calcified gallstone in dependent gallbladder measures 1.4 cm. No adrenal  gland mass is noted in visualized upper abdomen. Mild bilateral perihilar peribronchial thickening. There is patchy peripheral infiltrate in left upper lobe and left lower lobe. Findings suspicious  for multifocal bronchopneumonia. Mild peripheral interstitial prominence bilateral upper lobe suspicious for peripheral interstitial edema or interstitial pneumonia . There is small right pleural effusion. Right basilar atelectasis or infiltrate is noted. The patient is status post median sternotomy and CABG. Sagittal images of the spine shows diffuse osteopenia and mild degenerative changes thoracic spine. Review of the MIP images confirms the above findings. IMPRESSION: 1. No pulmonary embolus is noted. 2. Patchy infiltrate/pneumonia noted in left upper lobe and left lower lobe. Mild peripheral interstitial prominence bilateral upper lobe and lingula suspicious for superimposed mild interstitial edema or pneumonitis. There is small right pleural effusion. Right base posterior atelectasis or infiltrate. 3. Borderline enlarged mediastinal lymph nodes probable reactive. 4. No aortic aneurysm. 5. Calcified gallstone within gallbladder measures 1.4 cm. 6. Mild perihilar bronchitic changes. 7. Status post CABG. Electronically Signed   By: Natasha Mead M.D.   On: 12/23/2015 12:36   Dg Chest Port 1 View  12/23/2015  CLINICAL DATA:  Cough, fever. EXAM: PORTABLE CHEST 1 VIEW COMPARISON:  Same day. FINDINGS: Stable cardiomegaly is noted. Status post coronary artery bypass graft. No pneumothorax or significant pleural effusion is noted. Slightly increased bilateral lung opacities are noted, with left greater than right. This is concerning for pneumonia or possibly edema. Bony thorax is unremarkable. IMPRESSION: Slightly increased bilateral lung opacities are noted, left greater than right. This is concerning for pneumonia or possibly edema. Electronically Signed   By: Lupita Raider, M.D.   On: 12/23/2015 18:24   Dg Chest Portable 1 View  12/23/2015  CLINICAL DATA:  Shortness of breath, fever EXAM: PORTABLE CHEST 1 VIEW COMPARISON:  11/22/2015 FINDINGS: Cardiomegaly again noted. Status post CABG. Chronic elevation of the  right hemidiaphragm. There is mild interstitial prominence and hazy airspace opacification in left lung suspicious for asymmetric edema or pneumonitis. Mild right perihilar streaky atelectasis or infiltrate. IMPRESSION: There is mild interstitial prominence and hazy airspace opacification in left lung suspicious for asymmetric edema or pneumonitis. Mild right perihilar streaky atelectasis or infiltrate. Electronically Signed   By: Natasha Mead M.D.   On: 12/23/2015 10:48   I have personally reviewed and evaluated these images and lab results as part of my medical decision-making.   EKG Interpretation None      MDM   Final diagnoses:  None   Patient presents to the ED for SOB and hypoxia.  I have concern for PE vs PNA.  CXR reveals likely pneumonia and code sepsis was called.  Will obtain CTA for further evaluation.  He is on NRB with O2sat 100%  Chest x-ray is consistent with pneumonia, he ws given antibiotics and tylenol for fever. I spoke with the [facility] hospitalist who will admit the patient as a bounce back admission. Patient will go to stepdown as he is still on a nonrebreather.   CTA pending for possible PE.  CTA neg for PE, revels only pneumonia.    CRITICAL CARE Performed by: Tomasita Crumble   Total critical care time: 35 minutes - resp distress  Critical care time was exclusive of separately billable procedures and treating other patients.  Critical care was necessary to treat or prevent imminent or life-threatening deterioration.  Critical care was time spent personally by me on the following activities: development of treatment plan with patient and/or surrogate as well as nursing, discussions  with consultants, evaluation of patient's response to treatment, examination of patient, obtaining history from patient or surrogate, ordering and performing treatments and interventions, ordering and review of laboratory studies, ordering and review of radiographic studies, pulse  oximetry and re-evaluation of patient's condition.   Tomasita Crumble, MD 12/23/15 2218

## 2015-12-23 NOTE — ED Notes (Signed)
Pt cbg 40. RN, MD aware

## 2015-12-23 NOTE — Progress Notes (Signed)
Pharmacy Code Sepsis Protocol  Time of code sepsis page: 1054  Antibiotics delivered at 1103  Antibiotics administered prior to code at  (if checked, omit next 2 questions)  Were antibiotics ordered at the time of the code sepsis page? Yes Was it required to contact the physician?  Physician not contacted  Physician contacted to order antibiotics for code sepsis  Physician contacted to recommend changing antibiotics  Pharmacy consulted for: N/A  Anti-infectives    Start     Dose/Rate Route Frequency Ordered Stop   12/23/15 1100  ceFEPIme (MAXIPIME) 2 g in dextrose 5 % 50 mL IVPB     2 g 100 mL/hr over 30 Minutes Intravenous  Once 12/23/15 1049     12/23/15 1100  vancomycin (VANCOCIN) 1,500 mg in sodium chloride 0.9 % 500 mL IVPB     1,500 mg 250 mL/hr over 120 Minutes Intravenous  Once 12/23/15 1049          Nurse education provided:  Minutes left to administer antibiotics to achieve 1 hour goal  Correct order of antibiotic administration  Antibiotic Y-site compatibilities     Debanhi Blaker, Drake Leach, PharmD 12/23/2015, 11:04 AM

## 2015-12-23 NOTE — Progress Notes (Signed)
Pt bladder scanned. Volume scanned was 211. Emptied 425 out of condom cath bag. 40 of IV lasix given. Will continue to monitor.   Reginold Agent, RN

## 2015-12-23 NOTE — ED Notes (Signed)
Dr. Oni MD at bedside. 

## 2015-12-23 NOTE — ED Notes (Signed)
CBG 78. 

## 2015-12-24 ENCOUNTER — Encounter (HOSPITAL_COMMUNITY): Payer: Self-pay | Admitting: General Practice

## 2015-12-24 DIAGNOSIS — D649 Anemia, unspecified: Secondary | ICD-10-CM

## 2015-12-24 DIAGNOSIS — J9621 Acute and chronic respiratory failure with hypoxia: Secondary | ICD-10-CM

## 2015-12-24 LAB — BASIC METABOLIC PANEL
ANION GAP: 10 (ref 5–15)
BUN: 30 mg/dL — ABNORMAL HIGH (ref 6–20)
CALCIUM: 8.3 mg/dL — AB (ref 8.9–10.3)
CO2: 29 mmol/L (ref 22–32)
Chloride: 102 mmol/L (ref 101–111)
Creatinine, Ser: 1.44 mg/dL — ABNORMAL HIGH (ref 0.61–1.24)
GFR, EST AFRICAN AMERICAN: 50 mL/min — AB (ref >60)
GFR, EST NON AFRICAN AMERICAN: 43 mL/min — AB (ref >60)
Glucose, Bld: 95 mg/dL (ref 65–99)
Potassium: 4 mmol/L (ref 3.5–5.1)
Sodium: 141 mmol/L (ref 135–145)

## 2015-12-24 LAB — URINE CULTURE: Culture: NO GROWTH

## 2015-12-24 LAB — GLUCOSE, CAPILLARY
GLUCOSE-CAPILLARY: 230 mg/dL — AB (ref 65–99)
GLUCOSE-CAPILLARY: 235 mg/dL — AB (ref 65–99)
GLUCOSE-CAPILLARY: 214 mg/dL — AB (ref 65–99)
GLUCOSE-CAPILLARY: 279 mg/dL — AB (ref 65–99)
Glucose-Capillary: 108 mg/dL — ABNORMAL HIGH (ref 65–99)
Glucose-Capillary: 218 mg/dL — ABNORMAL HIGH (ref 65–99)
Glucose-Capillary: 93 mg/dL (ref 65–99)
Glucose-Capillary: 95 mg/dL (ref 65–99)
Glucose-Capillary: 98 mg/dL (ref 65–99)

## 2015-12-24 LAB — CBC
HCT: 24.6 % — ABNORMAL LOW (ref 39.0–52.0)
HEMOGLOBIN: 8.1 g/dL — AB (ref 13.0–17.0)
MCH: 32.8 pg (ref 26.0–34.0)
MCHC: 32.9 g/dL (ref 30.0–36.0)
MCV: 99.6 fL (ref 78.0–100.0)
Platelets: 249 10*3/uL (ref 150–400)
RBC: 2.47 MIL/uL — AB (ref 4.22–5.81)
RDW: 15.7 % — ABNORMAL HIGH (ref 11.5–15.5)
WBC: 7.9 10*3/uL (ref 4.0–10.5)

## 2015-12-24 LAB — HEMOGLOBIN A1C
HEMOGLOBIN A1C: 8.5 % — AB (ref 4.8–5.6)
MEAN PLASMA GLUCOSE: 197 mg/dL

## 2015-12-24 LAB — STREP PNEUMONIAE URINARY ANTIGEN: STREP PNEUMO URINARY ANTIGEN: NEGATIVE

## 2015-12-24 MED ORDER — FUROSEMIDE 10 MG/ML IJ SOLN
40.0000 mg | Freq: Two times a day (BID) | INTRAMUSCULAR | Status: DC
  Administered 2015-12-24 – 2015-12-29 (×10): 40 mg via INTRAVENOUS
  Filled 2015-12-24 (×10): qty 4

## 2015-12-24 NOTE — Progress Notes (Signed)
[facility] PROGRESS NOTE  [name] ZOX:[id number] DOB: 03-Feb-1928 DOA: 12/23/2015 PCP: Gwen Pounds, MD  Overnight: Chart reviewed. Required bipap. Now off.  Assessment/Plan:  Principal Problem:   Acute on chronic respiratory failure with hypoxia (HCC): Suspect mainly related to acute on chronic diastolic heart failure, (no cough, fever, leukocytosis) but cannot rule out a component of healthcare associated pneumonia. Will continue antibiotics for now. Diuresis. Recheck chest x-ray in the morning. Active Problems:   CAD (coronary artery disease)   Essential hypertension   Diabetes mellitus with neurological manifestation (HCC): SSI   Dyslipidemia   Acute on chronic diastolic CHF (congestive heart failure) (HCC): echo a month ago difficult study but near normal ejection fraction, could not evaluate diastolic function. Weight on admission was 201 pounds. A month ago, ranged 175-198 and at discharge was 190.   CKD (chronic kidney disease), stage III stable   HCAP (healthcare-associated pneumonia): see above   Anemia, chronic: monitor. No signs of bleeding Physical therapy to work with patient during hospitalization. At baseline walks with the [employer]. 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 Had been at home for 6 days prior to this admission. Lives with wife.   HPI/Subjective: No cough. Leg edema better than previous. Does have orthopnea. Felt weak and legs gave out, the main reason he presented to hospital. Breathing easier this am  Objective: Filed Vitals:   12/24/15 0515 12/24/15 0756  BP: 132/49   Pulse: 67   Temp: 98.9 F (37.2 C) 99 F (37.2 C)  Resp: 23     Intake/Output Summary (Last 24 hours) at 12/24/15 1015 Last data filed at 12/24/15 0900  Gross per 24 hour  Intake 2466.67 ml  Output   2075 ml  Net 391.67 ml   Filed Weights   12/23/15 1120 12/23/15 1750 12/24/15 0459  Weight: 91.315 kg (201 lb 5 oz) 94.303 kg (207 lb 14.4 oz) 90.039 kg  (198 lb 8 oz)    Exam:   General:  Comfortable. A and o. Breathing nonlabored  Cardiovascular: RRR without MGR  Respiratory: CTA without WRR  Abdomen: S, NT, ND  Ext: 1 plus bilateral leg edema  Basic Metabolic Panel:  Recent Labs Lab 12/23/15 1050 12/23/15 2050 12/24/15 0535  NA 142 143 141  K 4.7 4.1 4.0  CL 100* 104 102  CO2 33* 31 29  GLUCOSE 72 119* 95  BUN 36* 31* 30*  CREATININE 1.59* 1.47* 1.44*  CALCIUM 9.0 8.4* 8.3*   Liver Function Tests:  Recent Labs Lab 12/23/15 1050 12/23/15 2050  AST 24 23  ALT 18 18  ALKPHOS 52 41  BILITOT 0.4 0.6  PROT 5.8* 5.1*  ALBUMIN 2.7* 2.3*   No results for input(s): LIPASE, AMYLASE in the last 168 hours. No results for input(s): AMMONIA in the last 168 hours. CBC:  Recent Labs Lab 12/23/15 1050 12/23/15 2050 12/24/15 0535  WBC 9.7 8.6 7.9  NEUTROABS 7.6 6.6  --   HGB 9.6* 9.0* 8.1*  HCT 30.1* 27.2* 24.6*  MCV 101.3* 100.7* 99.6  PLT 325 260 249   Cardiac Enzymes: No results for input(s): CKTOTAL, CKMB, CKMBINDEX, TROPONINI in the last 168 hours. BNP (last 3 results)  Recent Labs  11/11/15 2227 11/20/15 1325 12/23/15 1333  BNP 414.8* 1407.2* 460.5*    ProBNP (last 3 results) No results for input(s): PROBNP in the last 8760 hours.  CBG:  Recent Labs Lab 12/23/15 2139 12/24/15 0025 12/24/15 0227 12/24/15 0557 12/24/15 0758  GLUCAP  88 108* 98 93 95    Recent Results (from the past 240 hour(s))  MRSA PCR Screening     Status: None   Collection Time: 12/23/15  5:47 PM  Result Value Ref Range Status   MRSA by PCR NEGATIVE NEGATIVE Final    Comment:        The GeneXpert MRSA Assay (FDA approved for NASAL specimens only), is one component of a comprehensive MRSA colonization surveillance program. It is not intended to diagnose MRSA infection nor to guide or monitor treatment for MRSA infections.      Studies: Ct Angio Chest Pe W/cm &/or Wo Cm  12/23/2015  CLINICAL DATA:   Hypoxia, evaluate for pulmonary embolus, pneumonia EXAM: CT ANGIOGRAPHY CHEST WITH CONTRAST TECHNIQUE: Multidetector CT imaging of the chest was performed using the standard protocol during bolus administration of intravenous contrast. Multiplanar CT image reconstructions and MIPs were obtained to evaluate the vascular anatomy. CONTRAST:  80mL OMNIPAQUE IOHEXOL 350 MG/ML SOLN COMPARISON:  Chest x-ray same day and CT chest 03/05/2009 FINDINGS: Images of the thoracic inlet shows 1.2 cm low-density nodule left lobe thyroid gland. On the prior exam this measures 8 mm. A right pretracheal lymph node measures 1 cm short-axis borderline by size criteria. A lower pretracheal/precarinal lymph node measures 1.8 x 1 cm. AP window lymph node measures 1.5 cm by short-axis. A left mediastinal lymph node just lateral to pulmonary artery measures 1.6 cm by 1.3 cm. No significant hilar adenopathy. The study is of excellent technical quality. No pulmonary emboli are identified. The visualized upper abdomen shows calcified gallstone in dependent gallbladder measures 1.4 cm. No adrenal gland mass is noted in visualized upper abdomen. Mild bilateral perihilar peribronchial thickening. There is patchy peripheral infiltrate in left upper lobe and left lower lobe. Findings suspicious for multifocal bronchopneumonia. Mild peripheral interstitial prominence bilateral upper lobe suspicious for peripheral interstitial edema or interstitial pneumonia . There is small right pleural effusion. Right basilar atelectasis or infiltrate is noted. The patient is status post median sternotomy and CABG. Sagittal images of the spine shows diffuse osteopenia and mild degenerative changes thoracic spine. Review of the MIP images confirms the above findings. IMPRESSION: 1. No pulmonary embolus is noted. 2. Patchy infiltrate/pneumonia noted in left upper lobe and left lower lobe. Mild peripheral interstitial prominence bilateral upper lobe and lingula  suspicious for superimposed mild interstitial edema or pneumonitis. There is small right pleural effusion. Right base posterior atelectasis or infiltrate. 3. Borderline enlarged mediastinal lymph nodes probable reactive. 4. No aortic aneurysm. 5. Calcified gallstone within gallbladder measures 1.4 cm. 6. Mild perihilar bronchitic changes. 7. Status post CABG. Electronically Signed   By: Natasha Mead M.D.   On: 12/23/2015 12:36   Dg Chest Port 1 View  12/23/2015  CLINICAL DATA:  Cough, fever. EXAM: PORTABLE CHEST 1 VIEW COMPARISON:  Same day. FINDINGS: Stable cardiomegaly is noted. Status post coronary artery bypass graft. No pneumothorax or significant pleural effusion is noted. Slightly increased bilateral lung opacities are noted, with left greater than right. This is concerning for pneumonia or possibly edema. Bony thorax is unremarkable. IMPRESSION: Slightly increased bilateral lung opacities are noted, left greater than right. This is concerning for pneumonia or possibly edema. Electronically Signed   By: Lupita Raider, M.D.   On: 12/23/2015 18:24   Dg Chest Portable 1 View  12/23/2015  CLINICAL DATA:  Shortness of breath, fever EXAM: PORTABLE CHEST 1 VIEW COMPARISON:  11/22/2015 FINDINGS: Cardiomegaly again noted. Status post CABG. Chronic elevation  of the right hemidiaphragm. There is mild interstitial prominence and hazy airspace opacification in left lung suspicious for asymmetric edema or pneumonitis. Mild right perihilar streaky atelectasis or infiltrate. IMPRESSION: There is mild interstitial prominence and hazy airspace opacification in left lung suspicious for asymmetric edema or pneumonitis. Mild right perihilar streaky atelectasis or infiltrate. Electronically Signed   By: Natasha Mead M.D.   On: 12/23/2015 10:48    Scheduled Meds: . albuterol  5 mg Nebulization Once  . aspirin EC  81 mg Oral Daily  . ceFEPime (MAXIPIME) IV  1 g Intravenous Q24H  . fenofibrate  160 mg Oral Daily  .  furosemide  20 mg Intravenous Daily  . gabapentin  300 mg Oral BID  . heparin  5,000 Units Subcutaneous 3 times per day  . insulin aspart  0-9 Units Subcutaneous TID WC  . ipratropium-albuterol  3 mL Nebulization TID  . isosorbide mononitrate  15 mg Oral Daily  . pantoprazole  80 mg Oral Q1200  . polyethylene glycol  17 g Oral Daily  . ranolazine  1,000 mg Oral BID  . simvastatin  20 mg Oral QHS  . vancomycin  1,250 mg Intravenous Q24H   Continuous Infusions: . dextrose 5 % and 0.45% NaCl 25 mL/hr at 12/23/15 2156    Time spent: 35 minutes  Donni Oglesby L  [facility] [email or web address], password [facility] 12/24/2015, 10:15 AM  LOS: 1 day

## 2015-12-24 NOTE — Evaluation (Signed)
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  PMHx:  DM, HLD, CAD, MI with CABG.  Clinical Impression  Pt was seen to check his tolerance for activity and did note lower O2 sats with sitting and standing even on 4L O2.  His plan is to get therapy inpt as his wife cannot care for him and he is not going to be safe to walk yet.  Will anticipate a return home once this is completed.    Follow Up Recommendations SNF    Equipment Recommendations  Rolling walker with 5" wheels    Recommendations for Other Services       Precautions / Restrictions Precautions Precautions: Fall (telemetry with O2 sats) Restrictions Weight Bearing Restrictions: No      Mobility  Bed Mobility Overal bed mobility: Needs Assistance Bed Mobility: Supine to Sit     Supine to sit: Mod assist     General bed mobility comments: assist to scoot out to EOB  Transfers Overall transfer level: Needs assistance Equipment used: Rolling walker (2 wheeled);1 person hand held assist Transfers: Sit to/from Stand Sit to Stand: Mod assist;From elevated surface         General transfer comment: needed mod assist to power up and could stand but declined tochair, did sidestep x 2  Ambulation/Gait             General Gait Details: unable  Stairs            Wheelchair Mobility    Modified Rankin (Stroke Patients Only)       Balance Overall balance assessment: Needs assistance Sitting-balance support: Feet supported Sitting balance-Leahy Scale: Fair     Standing balance support: Bilateral upper extremity supported Standing balance-Leahy Scale: Poor                               Pertinent Vitals/Pain Pain Assessment: 0-10 Pain Score: 4  Pain Location: lower  legs Pain Descriptors / Indicators: Aching Pain Intervention(s): Limited activity within patient's tolerance;Premedicated before session;Repositioned    Home Living Family/patient expects to be discharged to:: Private residence Living Arrangements: Spouse/significant other Available Help at Discharge: Family;Available 24 hours/day Type of Home: House Home Access: Stairs to enter Entrance Stairs-Rails: Right;Left;Can reach both Entrance Stairs-Number of Steps: 4 Home Layout: One level Home Equipment: Walker - 4 wheels;Bedside commode;Tub bench;Grab bars - tub/shower      Prior Function Level of Independence: Independent with assistive device(s);Needs assistance   Gait / Transfers Assistance Needed: rollator           Hand Dominance        Extremity/Trunk Assessment   Upper Extremity Assessment: Generalized weakness           Lower Extremity Assessment: Generalized weakness      Cervical / Trunk Assessment: Normal  Communication   Communication: No difficulties  Cognition Arousal/Alertness: Awake/alert Behavior During Therapy: WFL for tasks assessed/performed Overall Cognitive Status: Within Functional Limits for tasks assessed       Memory: Decreased recall of precautions;Decreased short-term memory              General Comments General comments (skin integrity, edema, etc.): Pt asked to sit bedside and did note even on 4L O2 was  88 to 92% O2 sats sitting bedside.  Was asking to eat bedside and asked his wife to sit next to him.  Nursing aware and approved his sitting but with condition that family there and that help is called for return to bed    Exercises        Assessment/Plan    PT Assessment Patient needs continued PT services  PT Diagnosis Generalized weakness   PT Problem List Decreased strength;Decreased range of motion;Decreased activity tolerance;Decreased balance;Decreased mobility;Decreased coordination;Decreased knowledge of use of  DME;Decreased safety awareness;Cardiopulmonary status limiting activity;Obesity;Pain  PT Treatment Interventions DME instruction;Gait training;Stair training;Functional mobility training;Therapeutic activities;Therapeutic exercise;Balance training;Neuromuscular re-education;Patient/family education   PT Goals (Current goals can be found in the Care Plan section) Acute Rehab PT Goals Patient Stated Goal: to be able to walk PT Goal Formulation: With patient/family Time For Goal Achievement: 01/07/16 Potential to Achieve Goals: Good    Frequency Min 2X/week   Barriers to discharge Decreased caregiver support;Inaccessible home environment will be too weak to get home immediately    Co-evaluation               End of Session Equipment Utilized During Treatment: Gait belt;Oxygen Activity Tolerance: Patient tolerated treatment well;Treatment limited secondary to medical complications (Comment) (O2 sats) Patient left: in bed;with bed alarm set;with family/visitor present;with nursing/sitter in room (sitting bed alarm) Nurse Communication: Mobility status         Time: 1610-9604 PT Time Calculation (min) (ACUTE ONLY): 28 min   Charges:   PT Evaluation $PT Eval Moderate Complexity: 1 Procedure PT Treatments $Therapeutic Activity: 8-22 mins   PT G Codes:        Ivar Drape January 19, 2016, 2:38 PM  Samul Dada, PT MS Acute Rehab Dept. Number: [id number] and [facility] [phone]

## 2015-12-24 NOTE — Progress Notes (Signed)
Utilization review completed. Laraina Sulton, RN, BSN. 

## 2015-12-24 NOTE — Progress Notes (Signed)
Patient stated his breathing was improved with Sp02=100% on bipap. Placed patient on nasal cannula set at 4lpm with Sp02=99%. Will continue to monitor patients status. Nurse aware of change.

## 2015-12-25 ENCOUNTER — Inpatient Hospital Stay (HOSPITAL_COMMUNITY): Payer: Medicare Other

## 2015-12-25 LAB — CBC
HEMATOCRIT: 26.6 % — AB (ref 39.0–52.0)
HEMOGLOBIN: 8.8 g/dL — AB (ref 13.0–17.0)
MCH: 32.8 pg (ref 26.0–34.0)
MCHC: 33.1 g/dL (ref 30.0–36.0)
MCV: 99.3 fL (ref 78.0–100.0)
Platelets: 282 10*3/uL (ref 150–400)
RBC: 2.68 MIL/uL — AB (ref 4.22–5.81)
RDW: 15.5 % (ref 11.5–15.5)
WBC: 7.1 10*3/uL (ref 4.0–10.5)

## 2015-12-25 LAB — BASIC METABOLIC PANEL
ANION GAP: 10 (ref 5–15)
BUN: 31 mg/dL — ABNORMAL HIGH (ref 6–20)
CHLORIDE: 96 mmol/L — AB (ref 101–111)
CO2: 32 mmol/L (ref 22–32)
Calcium: 8.4 mg/dL — ABNORMAL LOW (ref 8.9–10.3)
Creatinine, Ser: 1.51 mg/dL — ABNORMAL HIGH (ref 0.61–1.24)
GFR calc non Af Amer: 40 mL/min — ABNORMAL LOW (ref >60)
GFR, EST AFRICAN AMERICAN: 47 mL/min — AB (ref >60)
Glucose, Bld: 273 mg/dL — ABNORMAL HIGH (ref 65–99)
POTASSIUM: 4 mmol/L (ref 3.5–5.1)
Sodium: 138 mmol/L (ref 135–145)

## 2015-12-25 LAB — GLUCOSE, CAPILLARY
GLUCOSE-CAPILLARY: 177 mg/dL — AB (ref 65–99)
GLUCOSE-CAPILLARY: 153 mg/dL — AB (ref 65–99)
GLUCOSE-CAPILLARY: 142 mg/dL — AB (ref 65–99)
GLUCOSE-CAPILLARY: 214 mg/dL — AB (ref 65–99)
Glucose-Capillary: 183 mg/dL — ABNORMAL HIGH (ref 65–99)
Glucose-Capillary: 236 mg/dL — ABNORMAL HIGH (ref 65–99)
Glucose-Capillary: 244 mg/dL — ABNORMAL HIGH (ref 65–99)
Glucose-Capillary: 324 mg/dL — ABNORMAL HIGH (ref 65–99)

## 2015-12-25 LAB — BRAIN NATRIURETIC PEPTIDE: B NATRIURETIC PEPTIDE 5: 849.6 pg/mL — AB (ref 0.0–100.0)

## 2015-12-25 MED ORDER — INSULIN GLARGINE 100 UNIT/ML ~~LOC~~ SOLN
20.0000 [IU] | Freq: Every day | SUBCUTANEOUS | Status: DC
  Administered 2015-12-25: 20 [IU] via SUBCUTANEOUS
  Filled 2015-12-25 (×2): qty 0.2

## 2015-12-25 MED ORDER — FUROSEMIDE 10 MG/ML IJ SOLN
80.0000 mg | Freq: Once | INTRAMUSCULAR | Status: AC
  Administered 2015-12-25: 80 mg via INTRAVENOUS
  Filled 2015-12-25: qty 8

## 2015-12-25 MED ORDER — CLOPIDOGREL BISULFATE 75 MG PO TABS
75.0000 mg | ORAL_TABLET | Freq: Every day | ORAL | Status: DC
  Administered 2015-12-25 – 2015-12-29 (×5): 75 mg via ORAL
  Filled 2015-12-25 (×5): qty 1

## 2015-12-25 MED ORDER — HYDROCOD POLST-CPM POLST ER 10-8 MG/5ML PO SUER
5.0000 mL | Freq: Two times a day (BID) | ORAL | Status: DC | PRN
  Administered 2015-12-25 – 2015-12-27 (×3): 5 mL via ORAL
  Filled 2015-12-25 (×3): qty 5

## 2015-12-25 MED ORDER — FENTANYL CITRATE (PF) 100 MCG/2ML IJ SOLN
12.5000 ug | INTRAMUSCULAR | Status: DC | PRN
  Administered 2015-12-25 (×4): 12.5 ug via INTRAVENOUS
  Filled 2015-12-25 (×4): qty 2

## 2015-12-25 MED ORDER — LORAZEPAM 0.5 MG PO TABS
0.2500 mg | ORAL_TABLET | ORAL | Status: DC | PRN
  Administered 2015-12-26: 0.25 mg via ORAL
  Filled 2015-12-25: qty 1

## 2015-12-25 MED ORDER — GUAIFENESIN-DM 100-10 MG/5ML PO SYRP
5.0000 mL | ORAL_SOLUTION | ORAL | Status: DC | PRN
  Administered 2015-12-25 – 2015-12-27 (×2): 5 mL via ORAL
  Filled 2015-12-25 (×3): qty 5

## 2015-12-25 MED ORDER — INSULIN GLARGINE 100 UNIT/ML ~~LOC~~ SOLN
15.0000 [IU] | Freq: Every day | SUBCUTANEOUS | Status: DC
  Administered 2015-12-25: 15 [IU] via SUBCUTANEOUS
  Filled 2015-12-25 (×2): qty 0.15

## 2015-12-25 MED ORDER — DEXTROSE 5 % IV SOLN
500.0000 mg | INTRAVENOUS | Status: DC
  Administered 2015-12-25 – 2015-12-27 (×3): 500 mg via INTRAVENOUS
  Filled 2015-12-25 (×3): qty 500

## 2015-12-25 MED ORDER — CETYLPYRIDINIUM CHLORIDE 0.05 % MT LIQD
7.0000 mL | Freq: Two times a day (BID) | OROMUCOSAL | Status: DC
  Administered 2015-12-25 – 2015-12-29 (×8): 7 mL via OROMUCOSAL

## 2015-12-25 NOTE — Progress Notes (Signed)
During report, RN noticed pt had labored breathing and O2 Sats 86 on 6 L [place].  Upon assessment pt lungs had crackles throughout all lung fields.  Dr. Lendell Caprice notified and orders received to give pt 80 mg IV lasix once and obtain stat chest xray.  Respiratory therapist notified and came to put pt back on Bipap.  Will continue to closely monitor.

## 2015-12-25 NOTE — Progress Notes (Signed)
Pt looks like he is breathing better and is requesting to remove BiPap.  BiPap removed and pt placed on 4 L [place].  Will continue to closely monitor.

## 2015-12-25 NOTE — Progress Notes (Addendum)
[facility] PROGRESS NOTE  [name] QIH:[id number] DOB: [birth year] DOA: 12/23/2015 PCP: Gwen Pounds, MD  Overnight: Back on bipap overnight due to work of breathing  Assessment/Plan:  Principal Problem:   Acute on chronic respiratory failure with hypoxia (HCC): Suspect mainly related to acute on chronic diastolic heart failure, (no cough, fever, leukocytosis) but cannot rule out a component of healthcare associated pneumonia and/or ARDS. Chest x-ray worsening as is work of breathing. Will give an extra dose of Lasix 80 mg IV now. Repeat BNP. Discontinue vancomycin as MRSA PCR negative (high negative predictive value for MRSA pneumonia). Add azithromycin in case atypical pneumonia. Continue BiPAP as tolerated. Continue step down monitoring. CODE STATUS remains DO NOT INTUBATE. Appears fairly comfortable on BiPAP currently. Briefly reviewed last discharge summary. Some mention of considering palliative consult. Will discuss with family and consider consulting palliative. Addendum. Called wife, difficult to understand. It sounds like she is in hospital? Will proceed with PMT consult Active Problems:   CAD (coronary artery disease)   Essential hypertension   Diabetes mellitus with neurological manifestation (HCC): uncontrolled. Resume lantus at lower dose   Dyslipidemia   Acute on chronic diastolic CHF (congestive heart failure) (HCC): echo a month ago difficult study but near normal ejection fraction, could not evaluate diastolic function. Weight on admission was 201 pounds. A month ago, ranged 175-198 and at discharge was 190.   CKD (chronic kidney disease), stage III stable   HCAP (healthcare-associated pneumonia): see above   Anemia, chronic: monitor. No signs of bleeding Physical therapy to work with patient during hospitalization. At baseline walks with the Rollator. 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 Had been at home for 6 days prior to this admission. Lives with  wife.   HPI/Subjective: Complains of shortness of breath. History is difficult as patient is on BiPAP.  Objective: Filed Vitals:   12/25/15 0410 12/25/15 0743  BP: 137/60 140/53  Pulse: 88 81  Temp: 99.9 F (37.7 C)   Resp: 22 24    Intake/Output Summary (Last 24 hours) at 12/25/15 0823 Last data filed at 12/25/15 0300  Gross per 24 hour  Intake    866 ml  Output   2800 ml  Net  -1934 ml   Filed Weights   12/23/15 1750 12/24/15 0459 12/25/15 0410  Weight: 94.303 kg (207 lb 14.4 oz) 90.039 kg (198 lb 8 oz) 90.855 kg (200 lb 4.8 oz)    Exam:   General:  Lying supine on BiPAP. Able to answer questions appropriately. Alert. Appears fairly comfortable.  Cardiovascular: RRR without MGR  Respiratory: Diminished throughout without wheezes rhonchi or rales  Abdomen: S, NT, ND  Ext: Trace pitting edema.  Basic Metabolic Panel:  Recent Labs Lab 12/23/15 1050 12/23/15 2050 12/24/15 0535 12/25/15 0223  NA 142 143 141 138  K 4.7 4.1 4.0 4.0  CL 100* 104 102 96*  CO2 33* 31 29 32  GLUCOSE 72 119* 95 273*  BUN 36* 31* 30* 31*  CREATININE 1.59* 1.47* 1.44* 1.51*  CALCIUM 9.0 8.4* 8.3* 8.4*   Liver Function Tests:  Recent Labs Lab 12/23/15 1050 12/23/15 2050  AST 24 23  ALT 18 18  ALKPHOS 52 41  BILITOT 0.4 0.6  PROT 5.8* 5.1*  ALBUMIN 2.7* 2.3*   No results for input(s): LIPASE, AMYLASE in the last 168 hours. No results for input(s): AMMONIA in the last 168 hours. CBC:  Recent Labs Lab 12/23/15 1050 12/23/15 2050 12/24/15 0535 12/25/15 8756  WBC 9.7 8.6 7.9 7.1  NEUTROABS 7.6 6.6  --   --   HGB 9.6* 9.0* 8.1* 8.8*  HCT 30.1* 27.2* 24.6* 26.6*  MCV 101.3* 100.7* 99.6 99.3  PLT 325 260 249 282   Cardiac Enzymes: No results for input(s): CKTOTAL, CKMB, CKMBINDEX, TROPONINI in the last 168 hours. BNP (last 3 results)  Recent Labs  11/11/15 2227 11/20/15 1325 12/23/15 1333  BNP 414.8* 1407.2* 460.5*    ProBNP (last 3 results) No results  for input(s): PROBNP in the last 8760 hours.  CBG:  Recent Labs Lab 12/24/15 1650 12/24/15 1942 12/25/15 0013 12/25/15 0351 12/25/15 0738  GLUCAP 214* 279* 324* 236* 177*    Recent Results (from the past 240 hour(s))  Culture, blood (routine x 2)     Status: None (Preliminary result)   Collection Time: 12/23/15 10:50 AM  Result Value Ref Range Status   Specimen Description BLOOD LEFT ANTECUBITAL  Final   Special Requests BOTTLES DRAWN AEROBIC AND ANAEROBIC  Final   Culture NO GROWTH 1 DAY  Final   Report Status PENDING  Incomplete  Culture, blood (routine x 2)     Status: None (Preliminary result)   Collection Time: 12/23/15 10:55 AM  Result Value Ref Range Status   Specimen Description BLOOD RIGHT ANTECUBITAL  Final   Special Requests BOTTLES DRAWN AEROBIC AND ANAEROBIC  Final   Culture NO GROWTH 1 DAY  Final   Report Status PENDING  Incomplete  Urine culture     Status: None   Collection Time: 12/23/15 12:33 PM  Result Value Ref Range Status   Specimen Description URINE, RANDOM  Final   Special Requests NONE  Final   Culture NO GROWTH 1 DAY  Final   Report Status 12/24/2015 FINAL  Final  MRSA PCR Screening     Status: None   Collection Time: 12/23/15  5:47 PM  Result Value Ref Range Status   MRSA by PCR NEGATIVE NEGATIVE Final    Comment:        The GeneXpert MRSA Assay (FDA approved for NASAL specimens only), is one component of a comprehensive MRSA colonization surveillance program. It is not intended to diagnose MRSA infection nor to guide or monitor treatment for MRSA infections.      Studies: Ct Angio Chest Pe W/cm &/or Wo Cm  12/23/2015  CLINICAL DATA:  Hypoxia, evaluate for pulmonary embolus, pneumonia EXAM: CT ANGIOGRAPHY CHEST WITH CONTRAST TECHNIQUE: Multidetector CT imaging of the chest was performed using the standard protocol during bolus administration of intravenous contrast. Multiplanar CT image reconstructions and MIPs were obtained  to evaluate the vascular anatomy. CONTRAST:  80mL OMNIPAQUE IOHEXOL 350 MG/ML SOLN COMPARISON:  Chest x-ray same day and CT chest 03/05/2009 FINDINGS: Images of the thoracic inlet shows 1.2 cm low-density nodule left lobe thyroid gland. On the prior exam this measures 8 mm. A right pretracheal lymph node measures 1 cm short-axis borderline by size criteria. A lower pretracheal/precarinal lymph node measures 1.8 x 1 cm. AP window lymph node measures 1.5 cm by short-axis. A left mediastinal lymph node just lateral to pulmonary artery measures 1.6 cm by 1.3 cm. No significant hilar adenopathy. The study is of excellent technical quality. No pulmonary emboli are identified. The visualized upper abdomen shows calcified gallstone in dependent gallbladder measures 1.4 cm. No adrenal gland mass is noted in visualized upper abdomen. Mild bilateral perihilar peribronchial thickening. There is patchy peripheral infiltrate in left upper lobe and left lower lobe. Findings  suspicious for multifocal bronchopneumonia. Mild peripheral interstitial prominence bilateral upper lobe suspicious for peripheral interstitial edema or interstitial pneumonia . There is small right pleural effusion. Right basilar atelectasis or infiltrate is noted. The patient is status post median sternotomy and CABG. Sagittal images of the spine shows diffuse osteopenia and mild degenerative changes thoracic spine. Review of the MIP images confirms the above findings. IMPRESSION: 1. No pulmonary embolus is noted. 2. Patchy infiltrate/pneumonia noted in left upper lobe and left lower lobe. Mild peripheral interstitial prominence bilateral upper lobe and lingula suspicious for superimposed mild interstitial edema or pneumonitis. There is small right pleural effusion. Right base posterior atelectasis or infiltrate. 3. Borderline enlarged mediastinal lymph nodes probable reactive. 4. No aortic aneurysm. 5. Calcified gallstone within gallbladder measures 1.4 cm.  6. Mild perihilar bronchitic changes. 7. Status post CABG. Electronically Signed   By: Natasha Mead M.D.   On: 12/23/2015 12:36   Dg Chest Port 1 View  12/25/2015  CLINICAL DATA:  82 year old male with shortness of Breath. Cough and fever. Evidence of pneumonia on CTA chest. EXAM: PORTABLE CHEST 1 VIEW COMPARISON:  12/23/2015 and earlier. FINDINGS: Seated upright Portable AP view at 0739 hours. Continued widespread coarse and confluent pulmonary opacity, and this remains increased compared to the 1039 hours study on 12/23/2015. Most of the mediastinal contours are obscured. Prior CABG. Chronically low lung volumes. No pneumothorax. No large effusion. IMPRESSION: Continued widespread coarse and confluent pulmonary opacity superimposed on chronically low lung volumes. Appearance has mildly progressed since 12/23/2015. Electronically Signed   By: Odessa Fleming M.D.   On: 12/25/2015 08:00   Dg Chest Port 1 View  12/23/2015  CLINICAL DATA:  Cough, fever. EXAM: PORTABLE CHEST 1 VIEW COMPARISON:  Same day. FINDINGS: Stable cardiomegaly is noted. Status post coronary artery bypass graft. No pneumothorax or significant pleural effusion is noted. Slightly increased bilateral lung opacities are noted, with left greater than right. This is concerning for pneumonia or possibly edema. Bony thorax is unremarkable. IMPRESSION: Slightly increased bilateral lung opacities are noted, left greater than right. This is concerning for pneumonia or possibly edema. Electronically Signed   By: Lupita Raider, M.D.   On: 12/23/2015 18:24   Dg Chest Portable 1 View  12/23/2015  CLINICAL DATA:  Shortness of breath, fever EXAM: PORTABLE CHEST 1 VIEW COMPARISON:  11/22/2015 FINDINGS: Cardiomegaly again noted. Status post CABG. Chronic elevation of the right hemidiaphragm. There is mild interstitial prominence and hazy airspace opacification in left lung suspicious for asymmetric edema or pneumonitis. Mild right perihilar streaky atelectasis or  infiltrate. IMPRESSION: There is mild interstitial prominence and hazy airspace opacification in left lung suspicious for asymmetric edema or pneumonitis. Mild right perihilar streaky atelectasis or infiltrate. Electronically Signed   By: Natasha Mead M.D.   On: 12/23/2015 10:48    Scheduled Meds: . albuterol  5 mg Nebulization Once  . antiseptic oral rinse  7 mL Mouth Rinse BID  . aspirin EC  81 mg Oral Daily  . azithromycin  500 mg Intravenous Q24H  . ceFEPime (MAXIPIME) IV  1 g Intravenous Q24H  . fenofibrate  160 mg Oral Daily  . furosemide  40 mg Intravenous BID  . gabapentin  300 mg Oral BID  . heparin  5,000 Units Subcutaneous 3 times per day  . insulin aspart  0-9 Units Subcutaneous TID WC  . insulin glargine  20 Units Subcutaneous QHS  . ipratropium-albuterol  3 mL Nebulization TID  . isosorbide mononitrate  15 mg  Oral Daily  . pantoprazole  80 mg Oral Q1200  . polyethylene glycol  17 g Oral Daily  . ranolazine  1,000 mg Oral BID  . simvastatin  20 mg Oral QHS   Continuous Infusions:    Time spent: 35 minutes  Shammara Jarrett L  [facility] [email or web address], password [facility] 12/25/2015, 8:23 AM  LOS: 2 days

## 2015-12-25 NOTE — Consult Note (Signed)
Consultation Note Date: 12/25/2015   Patient Name: [name]  DOB: [birth year]  MRN: [id number]  Age / Sex: 82 y.o., male  PCP: Creola Corn, MD Referring Physician: Christiane Ha, MD  Reason for Consultation: Establishing goals of care, Hospice Evaluation and Non pain symptom management    Clinical Assessment/Narrative: 82 yo male with DM (with both autonomic and peripheral neuropathy), CAD s/p CABG, chronic respiratory failure, who had a recent hospitalization in December with an NSTEMI and HCAP. He was readmitted 12/23/15 with a combination of HCAP and congestive heart failure/interstitial edema.  He has been on and off of BiPAP this admission. He is currently desatting at rest into the low 80s on 5 L nasal cannula.    I spoke with Mr. and [name] today. [name] was a [name] with [employer].  He lost his first wife a few years ago. He and his second wife have been married about 10 years.  She was a Chartered loss adjuster. He has one son who is a professor in [name] and 1 daughter here in [name] from whom he is estranged.  He currently lives at home with his wife,  ambulates on a walker and uses 2 L of oxygen. He did skilled rehabilitation at [facility] for 3 weeks recently.  He complains of insomnia, cough and SOB.  He states his cough was improved with his "hydrocodone cough medicine" at home.  He tells me his goal is to get home.  Contacts/Participants in Discussion:  Wife and patient Primary Decision Maker: Patient  SUMMARY OF RECOMMENDATIONS  Code Status/Advance Care Planning: DNR   Symptom Management:   Dyspnea - Fentanyl ordered PRN dyspnea Cough - Tussonex ordered PRN cough Anxiety - will order low dose ativan PRN      Palliative Prophylaxis:   Aspiration, Bowel Regimen, Frequent Pain Assessment and frequent assessment for dyspnea  Additional Recommendations  (Limitations, Scope, Preferences):  Full Scope Treatment at this point aside from DNR/DNI   Psycho-social/Spiritual:  Support System: Adequate Desire for further Chaplaincy support:yes Additional Recommendations: Caregiving  Support/Resources  Prognosis: Given recurrent pneumonia and progressively worsening cardiac function and deconditioning, patient likely has less than 6 months to live.  He is at high risk for an acute event  Discharge Planning: Home with Hospice vs SNF with palliative medicine.   Chief Complaint/ Primary Diagnoses: Present on Admission:  . Diabetes mellitus with neurological manifestation (HCC) . CAD (coronary artery disease) . Essential hypertension . Dyslipidemia . Chronic diastolic heart failure (HCC) . HCAP (healthcare-associated pneumonia) . CKD (chronic kidney disease), stage III . Anemia  I have reviewed the medical record, interviewed the patient and family, and examined the patient. The following aspects are pertinent.  Past Medical History  Diagnosis Date  . Hypertension   . Diabetes mellitus   . Dyslipidemia   . Coronary artery disease     status post CABG. He is also status post PTCA and stenting of the circumflex artery  . Heart murmur   . Renal calculi   . 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 Cataract   . Myocardial infarction (HCC)   . NSTEMI (non-ST elevated myocardial infarction) (HCC) 11/12/2015  . Chronic respiratory failure (HCC)   . HCAP (healthcare-associated pneumonia)   . Shortness of breath dyspnea    Social History   Social History  . Marital Status: Married    Spouse Name: N/A  . Number of Children: N/A  . Years of Education: N/A  Social History Main Topics  . Smoking status: Former Smoker    Types: Pipe  . Smokeless tobacco: Never Used  . Alcohol Use: No  . Drug Use: No  . Sexual Activity: Yes   Other Topics Concern  . None   Social History Narrative   Family History    Problem Relation Age of Onset  . Pneumonia Father     Deceased age 57   Scheduled Meds: . albuterol  5 mg Nebulization Once  . antiseptic oral rinse  7 mL Mouth Rinse BID  . aspirin EC  81 mg Oral Daily  . azithromycin  500 mg Intravenous Q24H  . ceFEPime (MAXIPIME) IV  1 g Intravenous Q24H  . clopidogrel  75 mg Oral Daily  . fenofibrate  160 mg Oral Daily  . furosemide  40 mg Intravenous BID  . gabapentin  300 mg Oral BID  . heparin  5,000 Units Subcutaneous 3 times per day  . insulin aspart  0-9 Units Subcutaneous TID WC  . insulin glargine  15 Units Subcutaneous QHS  . ipratropium-albuterol  3 mL Nebulization TID  . isosorbide mononitrate  15 mg Oral Daily  . pantoprazole  80 mg Oral Q1200  . polyethylene glycol  17 g Oral Daily  . ranolazine  1,000 mg Oral BID  . simvastatin  20 mg Oral QHS   Continuous Infusions:  PRN Meds:.bisacodyl, chlorpheniramine-HYDROcodone, fentaNYL (SUBLIMAZE) injection, ondansetron **OR** ondansetron (ZOFRAN) IV, traMADol, zolpidem Medications Prior to Admission:  Prior to Admission medications   Medication Sig Start Date End Date Taking? Authorizing Provider  albuterol (PROVENTIL) (2.5 MG/3ML) 0.083% nebulizer solution Take 3 mLs (2.5 mg total) by nebulization every 2 (two) hours as needed for wheezing or shortness of breath. 11/24/15  Yes Jessica U Vann, DO  amLODipine (NORVASC) 5 MG tablet Take 1 tablet (5 mg total) by mouth daily. 11/24/15  Yes Joseph Art, DO  aspirin EC 81 MG tablet Take 1 tablet (81 mg total) by mouth daily. 07/30/14  Yes Vesta Mixer, MD  atenolol (TENORMIN) 50 MG tablet TAKE 1 TABLET BY MOUTH DAILY 06/05/15  Yes Vesta Mixer, MD  bisacodyl (DULCOLAX) 10 MG suppository Place 1 suppository (10 mg total) rectally daily as needed for moderate constipation. 11/24/15  Yes Joseph Art, DO  colchicine 0.6 MG tablet Take 1 tablet (0.6 mg total) by mouth daily. For 3 days 11/24/15  Yes Joseph Art, DO  esomeprazole (NEXIUM) 40 MG  capsule Take 40 mg by mouth daily before breakfast.     Yes Historical Provider, MD  fenofibrate 160 MG tablet Take 160 mg by mouth daily.     Yes Historical Provider, MD  furosemide (LASIX) 20 MG tablet Take 0.5 tablets by mouth daily. 10/30/15  Yes Historical Provider, MD  gabapentin (NEURONTIN) 300 MG capsule Take 300 mg by mouth 2 (two) times daily.    Yes Historical Provider, MD  Insulin Glargine (LANTUS [place]) Inject 40 Units into the skin at bedtime.    Yes Historical Provider, MD  insulin lispro (HUMALOG KWIKPEN) 100 UNIT/ML KiwkPen Inject 10 Units into the skin 3 (three) times daily. Sliding scale   Yes Historical Provider, MD  ipratropium-albuterol (DUONEB) 0.5-2.5 (3) MG/3ML SOLN Take 3 mLs by nebulization every 4 (four) hours as needed (sob wheezing). 11/24/15  Yes Joseph Art, DO  isosorbide mononitrate (IMDUR) 30 MG 24 hr tablet TAKE 1 TABLET (30 MG TOTAL) BY MOUTH DAILY. 12/03/15  Yes Vesta Mixer, MD  nystatin (MYCOSTATIN) 100000 UNIT/ML suspension Take 5 mLs (500,000 Units total) by mouth 4 (four) times daily. 11/24/15  Yes Joseph Art, DO  polyethylene glycol (MIRALAX / GLYCOLAX) packet Take 17 g by mouth daily. 11/24/15  Yes Joseph Art, DO  predniSONE (DELTASONE) 50 MG tablet Take 1 tablet (50 mg total) by mouth daily with breakfast. 11/24/15  Yes Jessica U Vann, DO  RANEXA 1000 MG SR tablet TAKE 1 TABLET BY MOUTH TWICE A DAY 08/12/15  Yes Vesta Mixer, MD  simvastatin (ZOCOR) 20 MG tablet TAKE 1 TABLET BY MOUTH AT BEDTIME 07/29/15  Yes Vesta Mixer, MD  traMADol (ULTRAM) 50 MG tablet Take 1 tablet (50 mg total) by mouth every 6 (six) hours as needed for moderate pain. 11/24/15  Yes Joseph Art, DO  zolpidem (AMBIEN) 5 MG tablet Take 1 tablet (5 mg total) by mouth at bedtime as needed for sleep. 11/24/15  Yes Joseph Art, DO  clopidogrel (PLAVIX) 75 MG tablet Take 1 tablet (75 mg total) by mouth daily. 11/24/15   Joseph Art, DO  insulin aspart (NOVOLOG) 100 UNIT/ML injection  Inject 0-5 Units into the skin at bedtime. 11/24/15   Joseph Art, DO  insulin aspart (NOVOLOG) 100 UNIT/ML injection Inject 0-9 Units into the skin 3 (three) times daily with meals. 11/24/15   Joseph Art, DO  insulin aspart (NOVOLOG) 100 UNIT/ML injection Inject 10 Units into the skin 3 (three) times daily with meals. 11/24/15   Joseph Art, DO   Allergies  Allergen Reactions  . Morphine And Related     Altered mental status  . Tetracyclines & Related     "Raw throat"    Review of Systems  Constitutional: Positive for activity change and fatigue. Negative for appetite change.  HENT: Positive for congestion.   Eyes: Negative.   Respiratory: Positive for cough, chest tightness and shortness of breath.   Cardiovascular: Positive for leg swelling.  Gastrointestinal: Negative.   Endocrine: Negative.   Genitourinary: Negative.   Musculoskeletal: Negative.   Skin: Negative.   Allergic/Immunologic: Negative.   Neurological: Negative.   Hematological: Negative.   Psychiatric/Behavioral: Negative.       Physical Exam  Constitutional: He is oriented to person, place, and time. He appears well-developed. He appears distressed.  HENT:  Head: Normocephalic and atraumatic.  Eyes: Pupils are equal, round, and reactive to light.  Neck: Normal range of motion.  Cardiovascular: Normal rate and normal heart sounds.   Respiratory:  Sob at rest.  Increased work of breathing when attempting to speak.  GI: Soft. He exhibits distension.  Musculoskeletal: Normal range of motion. He exhibits edema.  Neurological: He is alert and oriented to person, place, and time.  Skin: Skin is warm and dry.  Psychiatric: He has a normal mood and affect. His behavior is normal.    Vital Signs: BP 140/56 mmHg  Pulse 82  Temp(Src) 99.9 F (37.7 C) (Oral)  Resp 23  Ht 6' (1.829 m)  Wt 90.855 kg (200 lb 4.8 oz)  BMI 27.16 kg/m2  SpO2 93%  SpO2: SpO2: 93 % O2 Device:SpO2: 93 % O2 Flow Rate: .O2 Flow  Rate (L/min): 6 L/min  IO: Intake/output summary:  Intake/Output Summary (Last 24 hours) at 12/25/15 1520 Last data filed at 12/25/15 1200  Gross per 24 hour  Intake    120 ml  Output   3485 ml  Net  -3365 ml    LBM: Last BM Date:  12/24/15 Baseline Weight: Weight: 91.315 kg (201 lb 5 oz) Most recent weight: Weight: 90.855 kg (200 lb 4.8 oz)      Palliative Assessment/Data:  Flowsheet Rows        Most Recent Value   Intake Tab    Referral Department  Hospitalist   Unit at Time of Referral  Cardiac/Telemetry Unit   Palliative Care Primary Diagnosis  Sepsis/Infectious Disease   Date Notified  12/25/15   Palliative Care Type  New Palliative care   Reason for referral  Non-pain Symptom, Clarify Goals of Care, Counsel Regarding Hospice   Date of Admission  12/25/15   Date first seen by Palliative Care  12/25/15   # of days Palliative referral response time  0 Day(s)   # of days IP prior to Palliative referral  0   Clinical Assessment    Palliative Performance Scale Score  30%   Dyspnea Max Last 24 Hours  9   Dyspnea Min Last 24 hours  2   Anxiety Max Last 24 Hours  5   Anxiety Min Last 24 Hours  2   Psychosocial & Spiritual Assessment    Palliative Care Outcomes    Patient/Family meeting held?  Yes   Who was at the meeting?  patient and wife   Palliative Care Outcomes  Counseled regarding hospice, Changed CPR status   Patient/Family wishes: Interventions discontinued/not started   Mechanical Ventilation   Palliative Care follow-up planned  Yes, Facility      Additional Data Reviewed:  CBC:    Component Value Date/Time   WBC 7.1 12/25/2015 0223   HGB 8.8* 12/25/2015 0223   HCT 26.6* 12/25/2015 0223   PLT 282 12/25/2015 0223   MCV 99.3 12/25/2015 0223   NEUTROABS 6.6 12/23/2015 2050   LYMPHSABS 1.1 12/23/2015 2050   MONOABS 0.9 12/23/2015 2050   EOSABS 0.0 12/23/2015 2050   BASOSABS 0.0 12/23/2015 2050   Comprehensive Metabolic Panel:    Component Value  Date/Time   NA 138 12/25/2015 0223   K 4.0 12/25/2015 0223   CL 96* 12/25/2015 0223   CO2 32 12/25/2015 0223   BUN 31* 12/25/2015 0223   CREATININE 1.51* 12/25/2015 0223   GLUCOSE 273* 12/25/2015 0223   CALCIUM 8.4* 12/25/2015 0223   AST 23 12/23/2015 2050   ALT 18 12/23/2015 2050   ALKPHOS 41 12/23/2015 2050   BILITOT 0.6 12/23/2015 2050   PROT 5.1* 12/23/2015 2050   ALBUMIN 2.3* 12/23/2015 2050     Time In: 2:00 Time Out: 3:10 Time Total: 70 min. Greater than 50%  of this time was spent counseling and coordinating care related to the above assessment and plan.  Signed by:  Algis Downs, PA-C Palliative Medicine Pager: [phone]  12/25/2015, 3:20 PM  Please contact Palliative Medicine Team phone at [phone] for questions and concerns.

## 2015-12-25 NOTE — Progress Notes (Addendum)
Upon entering room, pt was pulling bipap mask off and tubing was disconnected.  Pt very anxious and confused (O2 sats went as low as 70%).  Nasal Canula applied at 6L.  Pt currently on 5L O2 nasal canula, O2 sats=99%. Pt alert and orientedx3.  Will continue to monitor.   2 view CXR that was ordered can not be done due to pt respiratory status.  MD will need to order portable CXR if needed.  Pt may be stable enough around 0800 if sats ok on nasal canula.  Will notify oncoming RN.  Radiology needs to be notified that pt will need 2 view when stable to travel.  Night coverage paged at [phone].  Dr. Lendell Caprice notified at 0720; new orders received.  Adelina Mings, RN updated.

## 2015-12-25 NOTE — Progress Notes (Signed)
Pt. Placed back on bipap due to having a difficult time breathing. Pt. Tolerating well at this time. RN present. Will continue to monitor pt.

## 2015-12-25 NOTE — Progress Notes (Signed)
Inpatient Diabetes Program Recommendations  AACE/ADA: New Consensus Statement on Inpatient Glycemic Control (2015)  Target Ranges:  Prepandial:   less than 140 mg/dL      Peak postprandial:   less than 180 mg/dL (1-2 hours)      Critically ill patients:  140 - 180 mg/dL    Results for [name], [name] (MRN [id number]) as of 12/25/2015 09:32  Ref. Range 12/24/2015 00:25 12/24/2015 02:27 12/24/2015 05:57 12/24/2015 07:58 12/24/2015 10:27 12/24/2015 11:44 12/24/2015 15:16 12/24/2015 16:50 12/24/2015 19:42  Glucose-Capillary Latest Ref Range: 65-99 mg/dL 308 (H) 98 93 95 657 (H) 235 (H) 218 (H) 214 (H) 279 (H)   Results for [name], [name] (MRN [id number]) as of 12/25/2015 09:32  Ref. Range 12/25/2015 00:13 12/25/2015 03:51 12/25/2015 07:38  Glucose-Capillary Latest Ref Range: 65-99 mg/dL 841 (H) 324 (H) 401 (H)    Admit with: Pneumonia  History: DM, MI, CABG, CKD  Home DM Meds: Lantus 40 units QHS       Humalog 10 units tidwc  Current Insulin Orders: Lantus 15 units QHS      Novolog Sensitive SSI (0-9 units) TID AC       MD- Please consider the following in-hospital insulin adjustments:  1. Increase Lantus to 20 units QHS  2. Change CBG orders to TID AC + HS (CBGs currently ordered Q2 hours)  3. Start Novolog Meal Coverage- Novolog 5 units tidwc (50% of home dose of Humalog)      --Will follow patient during hospitalization--  Ambrose Finland RN, MSN, CDE Diabetes Coordinator Inpatient Glycemic Control Team Team Pager: [phone] (8a-5p)

## 2015-12-25 NOTE — Research (Signed)
REDS_0  Informed Consent   Subject Name: [name]  Subject met inclusion and exclusion criteria.  The informed consent form, study requirements and expectations were reviewed with the subject and questions and concerns were addressed prior to the signing of the consent form.  The subject verbalized understanding of the trail requirements.  The subject agreed to participate in the REDS_1  trial and signed the informed consent.  The informed consent was obtained prior to performance of any protocol-specific procedures for the subject.  A copy of the signed informed consent was given to the subject and a copy was placed in the subject's medical record.  [name] 12/25/2015, 13:55

## 2015-12-26 ENCOUNTER — Ambulatory Visit: Payer: Medicare Other | Admitting: Cardiovascular Disease

## 2015-12-26 DIAGNOSIS — Z515 Encounter for palliative care: Secondary | ICD-10-CM | POA: Insufficient documentation

## 2015-12-26 DIAGNOSIS — J189 Pneumonia, unspecified organism: Secondary | ICD-10-CM

## 2015-12-26 DIAGNOSIS — Z7189 Other specified counseling: Secondary | ICD-10-CM | POA: Insufficient documentation

## 2015-12-26 LAB — GLUCOSE, CAPILLARY
GLUCOSE-CAPILLARY: 178 mg/dL — AB (ref 65–99)
Glucose-Capillary: 71 mg/dL (ref 65–99)
Glucose-Capillary: 260 mg/dL — ABNORMAL HIGH (ref 65–99)
Glucose-Capillary: 204 mg/dL — ABNORMAL HIGH (ref 65–99)

## 2015-12-26 LAB — BASIC METABOLIC PANEL
Anion gap: 9 (ref 5–15)
BUN: 29 mg/dL — ABNORMAL HIGH (ref 6–20)
CALCIUM: 8.4 mg/dL — AB (ref 8.9–10.3)
CO2: 36 mmol/L — AB (ref 22–32)
CREATININE: 1.76 mg/dL — AB (ref 0.61–1.24)
Chloride: 95 mmol/L — ABNORMAL LOW (ref 101–111)
GFR calc Af Amer: 39 mL/min — ABNORMAL LOW (ref >60)
GFR calc non Af Amer: 34 mL/min — ABNORMAL LOW (ref >60)
GLUCOSE: 97 mg/dL (ref 65–99)
Potassium: 3.7 mmol/L (ref 3.5–5.1)
Sodium: 140 mmol/L (ref 135–145)

## 2015-12-26 MED ORDER — INSULIN GLARGINE 100 UNIT/ML ~~LOC~~ SOLN
12.0000 [IU] | Freq: Every day | SUBCUTANEOUS | Status: DC
  Administered 2015-12-26 – 2015-12-28 (×3): 12 [IU] via SUBCUTANEOUS
  Filled 2015-12-26 (×4): qty 0.12

## 2015-12-26 NOTE — Care Management Important Message (Signed)
Important Message  Patient Details  Name: [name] MRN: [id number] Date of Birth: Aug 05, 1928   Medicare Important Message Given:  Yes    Jojo Geving Abena 12/26/2015, 11:30 AM

## 2015-12-26 NOTE — Progress Notes (Signed)
Report received via Judeth Cornfield RN using SBAR format, reviewed chart, orders, labs, VS, meds and patient's general condition, assumed care of patient.

## 2015-12-26 NOTE — Clinical Social Work Placement (Signed)
   CLINICAL SOCIAL WORK PLACEMENT  NOTE  Date:  12/26/2015  Patient Details  Name: [name] MRN: [id number] Date of Birth: 1928/06/06  Clinical Social Work is seeking post-discharge placement for this patient at the Skilled  Nursing Facility level of care (*CSW will initial, date and re-position this form in  chart as items are completed):  Yes   Patient/family provided with [facility] Clinical Social Work Department's list of facilities offering this level of care within the geographic area requested by the patient (or if unable, by the patient's family).  Yes   Patient/family informed of their freedom to choose among providers that offer the needed level of care, that participate in Medicare, Medicaid or managed care program needed by the patient, have an available bed and are willing to accept the patient.  Yes   Patient/family informed of 's ownership interest in [facility] and [facility], as well as of the fact that they are under no obligation to receive care at these facilities.  PASRR submitted to EDS on       PASRR number received on       Existing PASRR number confirmed on 12/26/15     FL2 transmitted to all facilities in geographic area requested by pt/family on 12/26/15     FL2 transmitted to all facilities within larger geographic area on       Patient informed that his/her managed care company has contracts with or will negotiate with certain facilities, including the following:            Patient/family informed of bed offers received.  Patient chooses bed at       Physician recommends and patient chooses bed at      Patient to be transferred to   on  .  Patient to be transferred to facility by       Patient family notified on   of transfer.  Name of family member notified:        PHYSICIAN       Additional Comment:    _______________________________________________ Venita Lick, LCSW 12/26/2015, 3:53 PM

## 2015-12-26 NOTE — Clinical Social Work Note (Signed)
Clinical Social Work Assessment  Patient Details  Name: [name] MRN: [id number] Date of Birth: [birth year]  Date of referral:  12/26/15               Reason for consult:  Facility Placement, Discharge Planning                Permission sought to share information with:  Facility Sport and exercise psychologist, Family Supports Permission granted to share information::  Yes, Verbal Permission Granted  Name::     Loss adjuster, chartered::  SNFs  Relationship::     Contact Information:     Housing/Transportation Living arrangements for the past 2 months:  [place] of Information:  Patient, Spouse Patient Interpreter Needed:  None Criminal Activity/Legal Involvement Pertinent to Current Situation/Hospitalization:  No - Comment as needed Significant Relationships:  Spouse Lives with:  Spouse Do you feel safe going back to the place where you live?  Yes Need for family participation in patient care:  Yes (Comment)  Care giving concerns:  Patient and family agreeable to SNF placement.   Social Worker assessment / plan:  CSW met with patient at bedside and spoke with wife by phone to complete assessment. Patient and wife are both requesting SNF placement at discharge. CSW explained SNF search/placement process and answered the family's questions. Patient and wife prefer [facility]. CSW will assist.   Employment status:  Retired Nurse, adult PT Recommendations:  [place] / Referral to community resources:  [place]  Patient/Family's Response to care:  Patient and wife appear happy with the care the patient is receiving.  Patient/Family's Understanding of and Emotional Response to Diagnosis, Current Treatment, and Prognosis:  Patient and patient's wife appear to have a good understanding of the patient's current condition and prognosis. Wife understands that the patient will need to discharge to a facility once  stable to leave the hospital.   Emotional Assessment Appearance:  Appears stated age Attitude/Demeanor/Rapport:  Other (Patient appropriate and welcoming of CSW.) Affect (typically observed):  Accepting, Appropriate, Calm, Pleasant Orientation:  Oriented to Self, Oriented to Place Alcohol / Substance use:  Never Used Psych involvement (Current and /or in the community):  No (Comment)  Discharge Needs  Concerns to be addressed:  Discharge Planning Concerns Readmission within the last 30 days:  Yes Current discharge risk:  Physical Impairment, Chronically ill, Cognitively Impaired Barriers to Discharge:  Continued Medical Work up   Fredderick Phenix B, LCSW 12/26/2015, 4:02 PM

## 2015-12-26 NOTE — Progress Notes (Signed)
[facility] PROGRESS NOTE  [name] RUE:[id number] DOB: Jul 11, 1928 DOA: 12/23/2015 PCP: Gwen Pounds, MD  Overnight: Able to remain off bipap  Assessment/Plan:  Principal Problem:   Acute on chronic respiratory failure with hypoxia (HCC): Suspect mainly related to acute on chronic diastolic heart failure, (no cough, fever, leukocytosis) but cannot rule out a component of healthcare associated pneumonia and/or ARDS. Yesterday's chest x-ray and BNP worse. Seems improved today. Continue current antibiotics and Lasix dosing. Repeat chest x-ray tomorrow. Appreciate palliative medicine to continue to work with patient and wife on goals of care as well as disposition. Active Problems:   CAD (coronary artery disease)   Essential hypertension   Diabetes mellitus with neurological manifestation (HCC): Had hypoglycemia today, mild. Will decrease Lantus to 12 units.   Dyslipidemia   Acute on chronic diastolic CHF (congestive heart failure) (HCC): echo a month ago difficult study but near normal ejection fraction, could not evaluate diastolic function. Weight on admission was 201 pounds. A month ago, ranged 175-198 and at discharge was 190.   CKD (chronic kidney disease), stage III. Creatinine increasing. May need to decrease Lasix, but will trend for now.   HCAP (healthcare-associated pneumonia): see above   Anemia, chronic: monitor. No signs of bleeding Physical therapy recommending skilled nursing facility. Patient is concerned about the cost. Patient's wife has concerns about her ability to care for him at home. Will consult social work.  Updated wife at bedside  HPI/Subjective: Feeling better today. Minimal cough.  Objective: Filed Vitals:   12/26/15 0833 12/26/15 0924  BP:  129/47  Pulse:  81  Temp: 99.1 F (37.3 C)   Resp:  27    Intake/Output Summary (Last 24 hours) at 12/26/15 1034 Last data filed at 12/26/15 0835  Gross per 24 hour  Intake    360 ml  Output    2090 ml  Net  -1730 ml   Filed Weights   12/24/15 0459 12/25/15 0410 12/26/15 0441  Weight: 90.039 kg (198 lb 8 oz) 90.855 kg (200 lb 4.8 oz) 87.454 kg (192 lb 12.8 oz)    Exam:   General:  In chair on nasal cannula oxygen. Breathing nonlabored.  Cardiovascular: RRR without MGR  Respiratory: Diminished throughout without wheezes rhonchi. Rales noted anteriorly left chest  Abdomen: S, NT, ND  Ext: Trace pitting edema.  Basic Metabolic Panel:  Recent Labs Lab 12/23/15 1050 12/23/15 2050 12/24/15 0535 12/25/15 0223 12/26/15 0420  NA 142 143 141 138 140  K 4.7 4.1 4.0 4.0 3.7  CL 100* 104 102 96* 95*  CO2 33* 31 29 32 36*  GLUCOSE 72 119* 95 273* 97  BUN 36* 31* 30* 31* 29*  CREATININE 1.59* 1.47* 1.44* 1.51* 1.76*  CALCIUM 9.0 8.4* 8.3* 8.4* 8.4*   Liver Function Tests:  Recent Labs Lab 12/23/15 1050 12/23/15 2050  AST 24 23  ALT 18 18  ALKPHOS 52 41  BILITOT 0.4 0.6  PROT 5.8* 5.1*  ALBUMIN 2.7* 2.3*   No results for input(s): LIPASE, AMYLASE in the last 168 hours. No results for input(s): AMMONIA in the last 168 hours. CBC:  Recent Labs Lab 12/23/15 1050 12/23/15 2050 12/24/15 0535 12/25/15 0223  WBC 9.7 8.6 7.9 7.1  NEUTROABS 7.6 6.6  --   --   HGB 9.6* 9.0* 8.1* 8.8*  HCT 30.1* 27.2* 24.6* 26.6*  MCV 101.3* 100.7* 99.6 99.3  PLT 325 260 249 282   Cardiac Enzymes: No results for input(s): CKTOTAL, CKMB, CKMBINDEX, TROPONINI  in the last 168 hours. BNP (last 3 results)  Recent Labs  11/20/15 1325 12/23/15 1333 12/25/15 0223  BNP 1407.2* 460.5* 849.6*    ProBNP (last 3 results) No results for input(s): PROBNP in the last 8760 hours.  CBG:  Recent Labs Lab 12/25/15 1202 12/25/15 1428 12/25/15 1614 12/25/15 2221 12/26/15 0734  GLUCAP 142* 214* 244* 183* 71    Recent Results (from the past 240 hour(s))  Culture, blood (routine x 2)     Status: None (Preliminary result)   Collection Time: 12/23/15 10:50 AM  Result Value Ref  Range Status   Specimen Description BLOOD LEFT ANTECUBITAL  Final   Special Requests BOTTLES DRAWN AEROBIC AND ANAEROBIC  Final   Culture NO GROWTH 2 DAYS  Final   Report Status PENDING  Incomplete  Culture, blood (routine x 2)     Status: None (Preliminary result)   Collection Time: 12/23/15 10:55 AM  Result Value Ref Range Status   Specimen Description BLOOD RIGHT ANTECUBITAL  Final   Special Requests BOTTLES DRAWN AEROBIC AND ANAEROBIC  Final   Culture NO GROWTH 2 DAYS  Final   Report Status PENDING  Incomplete  Urine culture     Status: None   Collection Time: 12/23/15 12:33 PM  Result Value Ref Range Status   Specimen Description URINE, RANDOM  Final   Special Requests NONE  Final   Culture NO GROWTH 1 DAY  Final   Report Status 12/24/2015 FINAL  Final  MRSA PCR Screening     Status: None   Collection Time: 12/23/15  5:47 PM  Result Value Ref Range Status   MRSA by PCR NEGATIVE NEGATIVE Final    Comment:        The GeneXpert MRSA Assay (FDA approved for NASAL specimens only), is one component of a comprehensive MRSA colonization surveillance program. It is not intended to diagnose MRSA infection nor to guide or monitor treatment for MRSA infections.      Studies: Dg Chest Port 1 View  12/25/2015  CLINICAL DATA:  82 year old male with shortness of Breath. Cough and fever. Evidence of pneumonia on CTA chest. EXAM: PORTABLE CHEST 1 VIEW COMPARISON:  12/23/2015 and earlier. FINDINGS: Seated upright Portable AP view at 0739 hours. Continued widespread coarse and confluent pulmonary opacity, and this remains increased compared to the 1039 hours study on 12/23/2015. Most of the mediastinal contours are obscured. Prior CABG. Chronically low lung volumes. No pneumothorax. No large effusion. IMPRESSION: Continued widespread coarse and confluent pulmonary opacity superimposed on chronically low lung volumes. Appearance has mildly progressed since 12/23/2015. Electronically  Signed   By: Odessa Fleming M.D.   On: 12/25/2015 08:00    Scheduled Meds: . albuterol  5 mg Nebulization Once  . antiseptic oral rinse  7 mL Mouth Rinse BID  . aspirin EC  81 mg Oral Daily  . azithromycin  500 mg Intravenous Q24H  . ceFEPime (MAXIPIME) IV  1 g Intravenous Q24H  . clopidogrel  75 mg Oral Daily  . fenofibrate  160 mg Oral Daily  . furosemide  40 mg Intravenous BID  . gabapentin  300 mg Oral BID  . heparin  5,000 Units Subcutaneous 3 times per day  . insulin aspart  0-9 Units Subcutaneous TID WC  . insulin glargine  12 Units Subcutaneous QHS  . ipratropium-albuterol  3 mL Nebulization TID  . isosorbide mononitrate  15 mg Oral Daily  . pantoprazole  80 mg Oral Q1200  . polyethylene  glycol  17 g Oral Daily  . ranolazine  1,000 mg Oral BID  . simvastatin  20 mg Oral QHS   Continuous Infusions:    Time spent: 25 minutes  Lynn Recendiz L  [facility] [email or web address], password [facility] 12/26/2015, 10:34 AM  LOS: 3 days

## 2015-12-26 NOTE — NC FL2 (Signed)
[place] MEDICAID FL2 LEVEL OF CARE SCREENING TOOL     IDENTIFICATION  Patient Name: [name] Birthdate: 1928-10-25 Sex: male Admission Date (Current Location): 12/23/2015  [facility] and [place] Number:  Producer, television/film/video and Address:  The . [facility], [address], [place], [place] [address]      Provider Number: [id number]  Attending Physician Name and Address:  Christiane Ha, MD  Relative Name and Phone Number:       Current Level of Care: Hospital Recommended Level of Care: Skilled Nursing Facility Prior Approval Number:    Date Approved/Denied:   PASRR Number: [id number]  Discharge Plan: SNF    Current Diagnoses: Patient Active Problem List   Diagnosis Date Noted  . Palliative care encounter   . Advanced directives, counseling/discussion   . Encounter for hospice care discussion   . Chronic diastolic heart failure (HCC) 12/23/2015  . Sepsis (HCC) 12/23/2015  . HCAP (healthcare-associated pneumonia) 12/23/2015  . Anemia 12/23/2015  . Acute on chronic respiratory failure with hypoxia (HCC) 12/23/2015  . Pressure ulcer 11/13/2015  . Acute on chronic diastolic CHF (congestive heart failure) (HCC)   . Acute respiratory failure with hypoxia (HCC)   . CHF (congestive heart failure) (HCC) 11/12/2015  . Elevated troponin 11/12/2015  . CKD (chronic kidney disease), stage III 11/12/2015  . Altered mental state 11/12/2015  . NSTEMI (non-ST elevated myocardial infarction) (HCC) 11/12/2015  . Acute congestive heart failure (HCC)   . Dyslipidemia 11/28/2014  . Type II or unspecified type diabetes mellitus with peripheral circulatory disorders, not stated as uncontrolled(250.70) 07/30/2014  . CAP (community acquired pneumonia) 10/30/2011  . Fracture of femoral neck, right (HCC) 10/28/2011  . Essential hypertension 10/28/2011  . Diabetes mellitus with neurological manifestation (HCC) 10/28/2011  . CAD (coronary artery disease)  08/06/2011    Orientation RESPIRATION BLADDER Height & Weight     Self, Time  O2 (4L) Incontinent Weight: 87.454 kg (192 lb 12.8 oz) Height:  6' (182.9 cm)  BEHAVIORAL SYMPTOMS/MOOD NEUROLOGICAL BOWEL NUTRITION STATUS   (NONE)  (NONE) Continent Diet (Heart Healthy Carb Modified)  AMBULATORY STATUS COMMUNICATION OF NEEDS Skin   Extensive Assist Verbally Other (Comment) (Stage II Buttocks, bilateral knee wound, left heel wound.)                       Personal Care Assistance Level of Assistance  Bathing, Dressing Bathing Assistance: Limited assistance   Dressing Assistance: Limited assistance     Functional Limitations Info  Sight, Hearing, Speech Sight Info: Adequate Hearing Info: Adequate Speech Info: Adequate    SPECIAL CARE FACTORS FREQUENCY  PT (By licensed PT), OT (By licensed OT)     PT Frequency: 5/week OT Frequency: 5/week            Contractures Contractures Info: Not present    Additional Factors Info  Code Status, Allergies, Insulin Sliding Scale Code Status Info: Partial Code Allergies Info: Morphine And Related, Tetracyclines & Related   Insulin Sliding Scale Info: 3/day       Current Medications (12/26/2015):  This is the current hospital active medication list Current Facility-Administered Medications  Medication Dose Route Frequency Provider Last Rate Last Dose  . albuterol (PROVENTIL) (2.5 MG/3ML) 0.083% nebulizer solution 5 mg  5 mg Nebulization Once Tomasita Crumble, MD   5 mg at 12/23/15 1045  . antiseptic oral rinse (CPC / CETYLPYRIDINIUM CHLORIDE 0.05%) solution 7 mL  7 mL Mouth Rinse BID Corinna L  Lendell Caprice, MD   7 mL at 12/26/15 1000  . aspirin EC tablet 81 mg  81 mg Oral Daily Lesle Chris Black, NP   81 mg at 12/26/15 0931  . azithromycin (ZITHROMAX) 500 mg in dextrose 5 % 250 mL IVPB  500 mg Intravenous Q24H Christiane Ha, MD   500 mg at 12/26/15 0932  . bisacodyl (DULCOLAX) suppository 10 mg  10 mg Rectal Daily PRN Lesle Chris Black, NP       . ceFEPIme (MAXIPIME) 1 g in dextrose 5 % 50 mL IVPB  1 g Intravenous Q24H Gwenyth Bender, NP   1 g at 12/26/15 1201  . chlorpheniramine-HYDROcodone (TUSSIONEX) 10-8 MG/5ML suspension 5 mL  5 mL Oral Q12H PRN Stephani Police, PA-C   5 mL at 12/26/15 1201  . clopidogrel (PLAVIX) tablet 75 mg  75 mg Oral Daily Christiane Ha, MD   75 mg at 12/26/15 0931  . fenofibrate tablet 160 mg  160 mg Oral Daily Gwenyth Bender, NP   160 mg at 12/26/15 0932  . fentaNYL (SUBLIMAZE) injection 12.5 mcg  12.5 mcg Intravenous Q2H PRN Christiane Ha, MD   12.5 mcg at 12/25/15 2251  . furosemide (LASIX) injection 40 mg  40 mg Intravenous BID Christiane Ha, MD   40 mg at 12/26/15 0932  . gabapentin (NEURONTIN) capsule 300 mg  300 mg Oral BID Lesle Chris Black, NP   300 mg at 12/26/15 0930  . guaiFENesin-dextromethorphan (ROBITUSSIN DM) 100-10 MG/5ML syrup 5 mL  5 mL Oral Q4H PRN Rolan Lipa, NP   5 mL at 12/25/15 2311  . heparin injection 5,000 Units  5,000 Units Subcutaneous 3 times per day Gwenyth Bender, NP   5,000 Units at 12/26/15 1321  . insulin aspart (novoLOG) injection 0-9 Units  0-9 Units Subcutaneous TID WC Gwenyth Bender, NP   5 Units at 12/26/15 1200  . insulin glargine (LANTUS) injection 12 Units  12 Units Subcutaneous QHS Christiane Ha, MD      . ipratropium-albuterol (DUONEB) 0.5-2.5 (3) MG/3ML nebulizer solution 3 mL  3 mL Nebulization TID Gwenyth Bender, NP   3 mL at 12/26/15 1353  . isosorbide mononitrate (IMDUR) 24 hr tablet 15 mg  15 mg Oral Daily Gwenyth Bender, NP   15 mg at 12/26/15 0931  . LORazepam (ATIVAN) tablet 0.25 mg  0.25 mg Oral Q4H PRN Stephani Police, PA-C   0.25 mg at 12/26/15 0000  . ondansetron (ZOFRAN) tablet 4 mg  4 mg Oral Q6H PRN Gwenyth Bender, NP       Or  . ondansetron [facility]) injection 4 mg  4 mg Intravenous Q6H PRN Gwenyth Bender, NP      . pantoprazole (PROTONIX) EC tablet 80 mg  80 mg Oral Q1200 Gwenyth Bender, NP   80 mg at 12/26/15 1200  .  polyethylene glycol (MIRALAX / GLYCOLAX) packet 17 g  17 g Oral Daily Gwenyth Bender, NP   17 g at 12/26/15 0932  . ranolazine (RANEXA) 12 hr tablet 1,000 mg  1,000 mg Oral BID Lesle Chris Black, NP   1,000 mg at 12/26/15 0930  . simvastatin (ZOCOR) tablet 20 mg  20 mg Oral QHS Gwenyth Bender, NP   20 mg at 12/25/15 2242  . traMADol (ULTRAM) tablet 50 mg  50 mg Oral Q6H PRN Lesle Chris Black, NP      . zolpidem [facility]) tablet 5 mg  5 mg Oral QHS PRN Gwenyth Bender, NP         Discharge Medications: Please see discharge summary for a list of discharge medications.  Relevant Imaging Results:  Relevant Lab Results:   Additional Information [id number]  Roddie Mc MSW, [place], [place], [phone]

## 2015-12-26 NOTE — Progress Notes (Signed)
Physical Therapy Treatment Patient Details Name: [name] MRN: [id number] DOB: August 08, 1928 Today's Date: 12/26/2015    History of Present Illness 82 yo male with onset of HCAP on 4L O2 via nasal cannula, with chronic LE numbness from DM neuropathy.  PMHx:  DM, HLD, CAD, MI with CABG.    PT Comments    Patient seen for second session to assist from chair to bed. Patient very fatigued and requiring increased assistance compared to earlier session. PT continuing to recommend SNF upon D/C.   Follow Up Recommendations  SNF     Equipment Recommendations  Rolling walker with 5" wheels    Recommendations for Other Services       Precautions / Restrictions Precautions Precautions: Fall Restrictions Weight Bearing Restrictions: No    Mobility  Bed Mobility Overal bed mobility: Needs Assistance Bed Mobility: Sit to Supine       Sit to supine: +2 for physical assistance;Max assist   General bed mobility comments: Patient very fatigued, requiring assist at trunk and LEs  Transfers Overall transfer level: Needs assistance Equipment used: Rolling walker (2 wheeled) Transfers: Sit to/from [employer] Transfers Sit to Stand: +2 physical assistance;Max assist Stand pivot transfers: +2 physical assistance;Max assist       General transfer comment: Patient very fatigued, unable to complete transfer on first attempt, having to sit and rest before attempting stand pivot. Bilateral LEs buckling with first attempt but improved but still unstable on second attempt.   Ambulation/Gait                 Stairs            Wheelchair Mobility    Modified Rankin (Stroke Patients Only)       Balance Overall balance assessment: Needs assistance Sitting-balance support: Bilateral upper extremity supported Sitting balance-Leahy Scale: Poor     Standing balance support: Bilateral upper extremity supported Standing balance-Leahy Scale: Poor Standing balance  comment: needing assist and rw                     Cognition Arousal/Alertness: Lethargic Behavior During Therapy: Flat affect Overall Cognitive Status: Within Functional Limits for tasks assessed                      Exercises      General Comments        Pertinent Vitals/Pain Pain Assessment: Faces Faces Pain Scale: Hurts little more Pain Descriptors / Indicators: Guarding;Grimacing    Home Living                      Prior Function            PT Goals (current goals can now be found in the care plan section) Acute Rehab PT Goals Patient Stated Goal: not expressed PT Goal Formulation: With patient/family Time For Goal Achievement: 01/07/16 Potential to Achieve Goals: Good Progress towards PT goals: Progressing toward goals    Frequency  Min 2X/week    PT Plan Current plan remains appropriate    Co-evaluation             End of Session Equipment Utilized During Treatment: Gait belt;Oxygen Activity Tolerance: Patient limited by fatigue Patient left: in bed;with call bell/phone within reach;with nursing/sitter in room;Other (comment) (patient left in nursing care)     Time: 4098-1191 PT Time Calculation (min) (ACUTE ONLY): 11 min  Charges:  $Therapeutic Activity: 8-22 mins  G Codes:      Christiane Ha, PT, CSCS Pager [phone] Office [phone]  12/26/2015, 4:15 PM

## 2015-12-26 NOTE — Progress Notes (Signed)
Physical Therapy Treatment Patient Details Name: [name] MRN: [id number] DOB: 09-30-1928 Today's Date: 12/26/2015    History of Present Illness 82 yo male with onset of HCAP on 4L O2 via nasal cannula, with chronic LE numbness from DM neuropathy.  PMHx:  DM, HLD, CAD, MI with CABG.    PT Comments    Patient able to transfer with assistance from bed to chair. Currently needing moderate assistance with bed mobility and transfers. Able to perform stand pivot to chair but fatigued following. PT to continue to follow at this time to work on mobility as tolerate.   Follow Up Recommendations  SNF     Equipment Recommendations  Rolling walker with 5" wheels    Recommendations for Other Services       Precautions / Restrictions Precautions Precautions: Fall Restrictions Weight Bearing Restrictions: No    Mobility  Bed Mobility Overal bed mobility: Needs Assistance Bed Mobility: Supine to Sit     Supine to sit: Mod assist;HOB elevated (at trunk, using rail)     General bed mobility comments: assist to scoot out to EOB  Transfers Overall transfer level: Needs assistance Equipment used: Rolling walker (2 wheeled) Transfers: Sit to/from Stand Sit to Stand: Mod assist Stand pivot transfers: +2 physical assistance;Min assist (assist with lines and moving rw)       General transfer comment: Stood at EOB then stand pivot with rw to chair. Maintaings a flexed trunk position, needing assist to transfer weight forward while standing.   Ambulation/Gait                 Stairs            Wheelchair Mobility    Modified Rankin (Stroke Patients Only)       Balance Overall balance assessment: Needs assistance Sitting-balance support: No upper extremity supported Sitting balance-Leahy Scale: Fair     Standing balance support: Bilateral upper extremity supported Standing balance-Leahy Scale: Poor Standing balance comment: using rw                     Cognition Arousal/Alertness: Awake/alert Behavior During Therapy: WFL for tasks assessed/performed Overall Cognitive Status: Within Functional Limits for tasks assessed                      Exercises      General Comments General comments (skin integrity, edema, etc.): SpO2 dropped to 85 % after transfer, back >90% withing 30 seconds with cues for deep breathing.      Pertinent Vitals/Pain Pain Assessment: No/denies pain    Home Living                      Prior Function            PT Goals (current goals can now be found in the care plan section) Acute Rehab PT Goals Patient Stated Goal: move a better. PT Goal Formulation: With patient/family Time For Goal Achievement: 01/07/16 Potential to Achieve Goals: Good Progress towards PT goals: Progressing toward goals    Frequency  Min 2X/week    PT Plan Current plan remains appropriate    Co-evaluation             End of Session Equipment Utilized During Treatment: Gait belt;Oxygen Activity Tolerance: Patient tolerated treatment well;Patient limited by fatigue Patient left: in chair;with call bell/phone within reach;with chair alarm set     Time: 4098-1191 PT Time Calculation (min) (ACUTE ONLY):  20 min  Charges:  $Therapeutic Activity: 8-22 mins                    G Codes:      Christiane Ha, PT, CSCS Pager [phone] Office [phone]  12/26/2015, 10:15 AM

## 2015-12-26 NOTE — Progress Notes (Signed)
Daily Progress Note   Patient Name: [name]       Date: 12/26/2015 DOB: 01-Apr-1928  Age: 82 y.o. MRN#: [id number] Attending Physician: Christiane Ha, MD Primary Care Physician: Gwen Pounds, MD Admit Date: 12/23/2015  Reason for Consultation/Follow-up: Establishing goals of care  Subjective: Feeling better today.  Wife reports he had an episode last night where he became very agitated and confused when his oxygen was off and his sats dropped into the 70s.    He slept a little better last night.  With regards to code status patient elects:  No intubation, No chest compressions or defibrillation, but full scope of treatment otherwise.   Interval Events: Patient still hypoxic on 4L of oxygen today, but appears with less respiratory distress.  We discussed taking advantage of hospice services at the appropriate time - perhaps after skilled rehab.  Mrs. [name] was very supportive of the idea of hospice.   Length of Stay: 3 days  Current Medications: Scheduled Meds:  . albuterol  5 mg Nebulization Once  . antiseptic oral rinse  7 mL Mouth Rinse BID  . aspirin EC  81 mg Oral Daily  . azithromycin  500 mg Intravenous Q24H  . ceFEPime (MAXIPIME) IV  1 g Intravenous Q24H  . clopidogrel  75 mg Oral Daily  . fenofibrate  160 mg Oral Daily  . furosemide  40 mg Intravenous BID  . gabapentin  300 mg Oral BID  . heparin  5,000 Units Subcutaneous 3 times per day  . insulin aspart  0-9 Units Subcutaneous TID WC  . insulin glargine  12 Units Subcutaneous QHS  . ipratropium-albuterol  3 mL Nebulization TID  . isosorbide mononitrate  15 mg Oral Daily  . pantoprazole  80 mg Oral Q1200  . polyethylene glycol  17 g Oral Daily  . ranolazine  1,000 mg Oral BID  . simvastatin  20 mg Oral  QHS    Continuous Infusions:    PRN Meds: bisacodyl, chlorpheniramine-HYDROcodone, fentaNYL (SUBLIMAZE) injection, guaiFENesin-dextromethorphan, LORazepam, ondansetron **OR** ondansetron (ZOFRAN) IV, traMADol, zolpidem   Physical Exam             Pleasant elderly male, sitting up in the chair, slightly increased work of breathing. CV:  RRR, S1, S2 Resp:  Non productive cough, slightly increased work of  breathing Abdomen:  Slightly distended.  + BS Extremities:  1-2 Edema.  Able to move all 4.    Vital Signs: BP 129/47 mmHg  Pulse 81  Temp(Src) 98.2 F (36.8 C) (Oral)  Resp 27  Ht 6' (1.829 m)  Wt 87.454 kg (192 lb 12.8 oz)  BMI 26.14 kg/m2  SpO2 100% SpO2: SpO2: 100 % O2 Device: O2 Device: Nasal Cannula O2 Flow Rate: O2 Flow Rate (L/min): 4 L/min  Intake/output summary:  Intake/Output Summary (Last 24 hours) at 12/26/15 1155 Last data filed at 12/26/15 1610  Gross per 24 hour  Intake    360 ml  Output   2090 ml  Net  -1730 ml   LBM: Last BM Date: 12/25/15 Baseline Weight: Weight: 91.315 kg (201 lb 5 oz) Most recent weight: Weight: 87.454 kg (192 lb 12.8 oz)       Palliative Assessment/Data: Flowsheet Rows        Most Recent Value   Intake Tab    Referral Department  Hospitalist   Unit at Time of Referral  Cardiac/Telemetry Unit   Palliative Care Primary Diagnosis  Sepsis/Infectious Disease   Date Notified  12/25/15   Palliative Care Type  New Palliative care   Reason for referral  Non-pain Symptom, Clarify Goals of Care, Counsel Regarding Hospice   Date of Admission  12/25/15   Date first seen by Palliative Care  12/25/15   # of days Palliative referral response time  0 Day(s)   # of days IP prior to Palliative referral  0   Clinical Assessment    Palliative Performance Scale Score  30%   Dyspnea Max Last 24 Hours  9   Dyspnea Min Last 24 hours  2   Anxiety Max Last 24 Hours  5   Anxiety Min Last 24 Hours  2   Psychosocial & Spiritual Assessment     Palliative Care Outcomes    Patient/Family meeting held?  Yes   Who was at the meeting?  patient and wife   Palliative Care Outcomes  Counseled regarding hospice, Changed CPR status   Patient/Family wishes: Interventions discontinued/not started   Mechanical Ventilation   Palliative Care follow-up planned  Yes, Facility      Additional Data Reviewed: CBC    Component Value Date/Time   WBC 7.1 12/25/2015 0223   RBC 2.68* 12/25/2015 0223   HGB 8.8* 12/25/2015 0223   HCT 26.6* 12/25/2015 0223   PLT 282 12/25/2015 0223   MCV 99.3 12/25/2015 0223   MCH 32.8 12/25/2015 0223   MCHC 33.1 12/25/2015 0223   RDW 15.5 12/25/2015 0223   LYMPHSABS 1.1 12/23/2015 2050   MONOABS 0.9 12/23/2015 2050   EOSABS 0.0 12/23/2015 2050   BASOSABS 0.0 12/23/2015 2050    CMP     Component Value Date/Time   NA 140 12/26/2015 0420   K 3.7 12/26/2015 0420   CL 95* 12/26/2015 0420   CO2 36* 12/26/2015 0420   GLUCOSE 97 12/26/2015 0420   BUN 29* 12/26/2015 0420   CREATININE 1.76* 12/26/2015 0420   CALCIUM 8.4* 12/26/2015 0420   PROT 5.1* 12/23/2015 2050   ALBUMIN 2.3* 12/23/2015 2050   AST 23 12/23/2015 2050   ALT 18 12/23/2015 2050   ALKPHOS 41 12/23/2015 2050   BILITOT 0.6 12/23/2015 2050   GFRNONAA 34* 12/26/2015 0420   GFRAA 39* 12/26/2015 0420       Problem List:  Patient Active Problem List   Diagnosis Date Noted  . Chronic  diastolic heart failure (HCC) 12/23/2015  . Sepsis (HCC) 12/23/2015  . HCAP (healthcare-associated pneumonia) 12/23/2015  . Anemia 12/23/2015  . Acute on chronic respiratory failure with hypoxia (HCC) 12/23/2015  . Pressure ulcer 11/13/2015  . Acute on chronic diastolic CHF (congestive heart failure) (HCC)   . Acute respiratory failure with hypoxia (HCC)   . CHF (congestive heart failure) (HCC) 11/12/2015  . Elevated troponin 11/12/2015  . CKD (chronic kidney disease), stage III 11/12/2015  . Altered mental state 11/12/2015  . NSTEMI (non-ST elevated  myocardial infarction) (HCC) 11/12/2015  . Acute congestive heart failure (HCC)   . Dyslipidemia 11/28/2014  . Type II or unspecified type diabetes mellitus with peripheral circulatory disorders, not stated as uncontrolled(250.70) 07/30/2014  . CAP (community acquired pneumonia) 10/30/2011  . Fracture of femoral neck, right (HCC) 10/28/2011  . Essential hypertension 10/28/2011  . Diabetes mellitus with neurological manifestation (HCC) 10/28/2011  . CAD (coronary artery disease) 08/06/2011     Palliative Care Assessment & Plan    1.Code Status:  Limited code - No chest compressions, Shock or intubation.  Otherwise Full scope of treatment.    Code Status Orders        Start     Ordered   12/26/15 1111  Limited resuscitation (code)   Continuous    Question Answer Comment  In the event of cardiac or respiratory ARREST: Initiate Code Blue, Call Rapid Response Yes   In the event of cardiac or respiratory ARREST: Perform CPR No   In the event of cardiac or respiratory ARREST: Perform Intubation/Mechanical Ventilation No   In the event of cardiac or respiratory ARREST: Use NIPPV/BiPAp only if indicated Yes   In the event of cardiac or respiratory ARREST: Administer ACLS medications if indicated Yes   In the event of cardiac or respiratory ARREST: Perform Defibrillation or Cardioversion if indicated No      12/26/15 1110     Prognosis: Given recurrent pneumonia and progressively worsening cardiac function and deconditioning, patient likely has less than 6 months to live. He is at high risk for an acute event   Discharge Planning:  Likely SNF with Palliative Medicine.   Care plan was discussed with Dr. Lendell Caprice  Thank you for allowing the Palliative Medicine Team to assist in the care of this patient.   Time In: 1130 Time Out: 1205 Total Time 35 min Prolonged Time Billed  no       Algis Downs, [place] Palliative Medicine Pager: [phone]   12/26/2015, 11:55 AM    Please contact Palliative Medicine Team phone at [phone] for questions and concerns.

## 2015-12-27 ENCOUNTER — Inpatient Hospital Stay (HOSPITAL_COMMUNITY): Payer: Medicare Other

## 2015-12-27 LAB — GLUCOSE, CAPILLARY
GLUCOSE-CAPILLARY: 133 mg/dL — AB (ref 65–99)
GLUCOSE-CAPILLARY: 228 mg/dL — AB (ref 65–99)
GLUCOSE-CAPILLARY: 209 mg/dL — AB (ref 65–99)
GLUCOSE-CAPILLARY: 230 mg/dL — AB (ref 65–99)
Glucose-Capillary: 177 mg/dL — ABNORMAL HIGH (ref 65–99)

## 2015-12-27 LAB — BASIC METABOLIC PANEL
ANION GAP: 10 (ref 5–15)
BUN: 31 mg/dL — ABNORMAL HIGH (ref 6–20)
CALCIUM: 8.4 mg/dL — AB (ref 8.9–10.3)
CHLORIDE: 92 mmol/L — AB (ref 101–111)
CO2: 37 mmol/L — AB (ref 22–32)
Creatinine, Ser: 1.73 mg/dL — ABNORMAL HIGH (ref 0.61–1.24)
GFR calc non Af Amer: 34 mL/min — ABNORMAL LOW (ref >60)
GFR, EST AFRICAN AMERICAN: 40 mL/min — AB (ref >60)
GLUCOSE: 143 mg/dL — AB (ref 65–99)
POTASSIUM: 3.6 mmol/L (ref 3.5–5.1)
Sodium: 139 mmol/L (ref 135–145)

## 2015-12-27 LAB — CBC
HEMATOCRIT: 25.8 % — AB (ref 39.0–52.0)
HEMOGLOBIN: 8.6 g/dL — AB (ref 13.0–17.0)
MCH: 33.1 pg (ref 26.0–34.0)
MCHC: 33.3 g/dL (ref 30.0–36.0)
MCV: 99.2 fL (ref 78.0–100.0)
Platelets: 267 10*3/uL (ref 150–400)
RBC: 2.6 MIL/uL — AB (ref 4.22–5.81)
RDW: 15.5 % (ref 11.5–15.5)
WBC: 7.8 10*3/uL (ref 4.0–10.5)

## 2015-12-27 MED ORDER — AZITHROMYCIN 250 MG PO TABS
500.0000 mg | ORAL_TABLET | Freq: Every day | ORAL | Status: DC
  Administered 2015-12-28 – 2015-12-29 (×2): 500 mg via ORAL
  Filled 2015-12-27 (×3): qty 2

## 2015-12-27 NOTE — Progress Notes (Signed)
Patient discussed with Dr. Lendell Caprice - not stable at this time. Possible d/c on Monday. Notified Janie at [facility]. Anticipates an available bed on Monday.  Patient's wife notified and pleased with same. She is very worried about past charges from the SNF when he was there prior and was encouraged to follow up Monday with their financial services department at [facility].  Lorri Frederick. Jaci Lazier, [place] [phone]

## 2015-12-27 NOTE — Progress Notes (Signed)
[facility] PROGRESS NOTE  [name] ZOX:[id number] DOB: [birth year] DOA: 12/23/2015 PCP: Gwen Pounds, MD  Overnight: Able to remain off bipap  Assessment/Plan:  Principal Problem:   Acute on chronic respiratory failure with hypoxia (HCC): secondary to CHF and HCAP. CXR unchanged. Change azithromycin to po. Continue IV lasix, and cefepime. Off bipap. Hopefully to SNF Monday if stable Active Problems:   CAD (coronary artery disease)   Essential hypertension   Diabetes mellitus with neurological manifestation (HCC): CBGs low, but no further hypoglycemia.   Dyslipidemia   Acute on chronic diastolic CHF (congestive heart failure) (HCC): echo a month ago difficult study but near normal ejection fraction, could not evaluate diastolic function. Weight on admission was 201 pounds. A month ago, ranged 175-198 and at discharge was 190. 192 today.   CKD (chronic kidney disease), stage III. Creatinine stable   HCAP (healthcare-associated pneumonia): see above   Anemia, chronic: monitor. No signs of bleeding Physical therapy recommending skilled nursing facility. Patient agreeable  Updated wife at bedside 2/3  HPI/Subjective: No complaints. No problems per nursing.  Objective: Filed Vitals:   12/27/15 0431 12/27/15 0849  BP: 148/69 133/58  Pulse: 76 86  Temp: 98.3 F (36.8 C) 98.6 F (37 C)  Resp: 27 28    Intake/Output Summary (Last 24 hours) at 12/27/15 1005 Last data filed at 12/27/15 0700  Gross per 24 hour  Intake    300 ml  Output   1000 ml  Net   -700 ml   Filed Weights   12/25/15 0410 12/26/15 0441 12/27/15 0431  Weight: 90.855 kg (200 lb 4.8 oz) 87.454 kg (192 lb 12.8 oz) 87.045 kg (191 lb 14.4 oz)    Exam:   General:  Asleep appears comfortable, but some central cyanosis. Increased oxygen  Cardiovascular: RRR without MGR  Respiratory: Diminished throughout without wheezes rhonchi. Rales noted anteriorly left chest  Abdomen: S, NT, ND  Ext:  Trace pitting edema.  Basic Metabolic Panel:  Recent Labs Lab 12/23/15 2050 12/24/15 0535 12/25/15 0223 12/26/15 0420 12/27/15 0408  NA 143 141 138 140 139  K 4.1 4.0 4.0 3.7 3.6  CL 104 102 96* 95* 92*  CO2 31 29 32 36* 37*  GLUCOSE 119* 95 273* 97 143*  BUN 31* 30* 31* 29* 31*  CREATININE 1.47* 1.44* 1.51* 1.76* 1.73*  CALCIUM 8.4* 8.3* 8.4* 8.4* 8.4*   Liver Function Tests:  Recent Labs Lab 12/23/15 1050 12/23/15 2050  AST 24 23  ALT 18 18  ALKPHOS 52 41  BILITOT 0.4 0.6  PROT 5.8* 5.1*  ALBUMIN 2.7* 2.3*   No results for input(s): LIPASE, AMYLASE in the last 168 hours. No results for input(s): AMMONIA in the last 168 hours. CBC:  Recent Labs Lab 12/23/15 1050 12/23/15 2050 12/24/15 0535 12/25/15 0223 12/27/15 0408  WBC 9.7 8.6 7.9 7.1 7.8  NEUTROABS 7.6 6.6  --   --   --   HGB 9.6* 9.0* 8.1* 8.8* 8.6*  HCT 30.1* 27.2* 24.6* 26.6* 25.8*  MCV 101.3* 100.7* 99.6 99.3 99.2  PLT 325 260 249 282 267   Cardiac Enzymes: No results for input(s): CKTOTAL, CKMB, CKMBINDEX, TROPONINI in the last 168 hours. BNP (last 3 results)  Recent Labs  11/20/15 1325 12/23/15 1333 12/25/15 0223  BNP 1407.2* 460.5* 849.6*    ProBNP (last 3 results) No results for input(s): PROBNP in the last 8760 hours.  CBG:  Recent Labs Lab 12/26/15 1141 12/26/15 1634 12/26/15 2107 12/27/15 0429 12/27/15  0748  GLUCAP 260* 204* 178* 133* 177*    Recent Results (from the past 240 hour(s))  Culture, blood (routine x 2)     Status: None (Preliminary result)   Collection Time: 12/23/15 10:50 AM  Result Value Ref Range Status   Specimen Description BLOOD LEFT ANTECUBITAL  Final   Special Requests BOTTLES DRAWN AEROBIC AND ANAEROBIC  Final   Culture NO GROWTH 3 DAYS  Final   Report Status PENDING  Incomplete  Culture, blood (routine x 2)     Status: None (Preliminary result)   Collection Time: 12/23/15 10:55 AM  Result Value Ref Range Status   Specimen Description  BLOOD RIGHT ANTECUBITAL  Final   Special Requests BOTTLES DRAWN AEROBIC AND ANAEROBIC  Final   Culture NO GROWTH 3 DAYS  Final   Report Status PENDING  Incomplete  Urine culture     Status: None   Collection Time: 12/23/15 12:33 PM  Result Value Ref Range Status   Specimen Description URINE, RANDOM  Final   Special Requests NONE  Final   Culture NO GROWTH 1 DAY  Final   Report Status 12/24/2015 FINAL  Final  MRSA PCR Screening     Status: None   Collection Time: 12/23/15  5:47 PM  Result Value Ref Range Status   MRSA by PCR NEGATIVE NEGATIVE Final    Comment:        The GeneXpert MRSA Assay (FDA approved for NASAL specimens only), is one component of a comprehensive MRSA colonization surveillance program. It is not intended to diagnose MRSA infection nor to guide or monitor treatment for MRSA infections.      Studies: Dg Chest 2 View  12/27/2015  CLINICAL DATA:  Pneumonia for 1 month.  CHF. 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 Cardiac and mediastinal contours are largely obscured. There is chronic elevation of the right hemidiaphragm. Diffuse bilateral airspace opacities involves the left greater than right lungs and have not significantly changed. No large pleural effusion or pneumothorax is identified. IMPRESSION: Unchanged diffuse bilateral airspace disease. Electronically Signed   By: Sebastian Ache M.D.   On: 12/27/2015 09:34    Scheduled Meds: . albuterol  5 mg Nebulization Once  . antiseptic oral rinse  7 mL Mouth Rinse BID  . aspirin EC  81 mg Oral Daily  . azithromycin  500 mg Intravenous Q24H  . ceFEPime (MAXIPIME) IV  1 g Intravenous Q24H  . clopidogrel  75 mg Oral Daily  . fenofibrate  160 mg Oral Daily  . furosemide  40 mg Intravenous BID  . gabapentin  300 mg Oral BID  . heparin  5,000 Units Subcutaneous 3 times per day  . insulin aspart  0-9 Units Subcutaneous TID WC  . insulin glargine  12  Units Subcutaneous QHS  . ipratropium-albuterol  3 mL Nebulization TID  . isosorbide mononitrate  15 mg Oral Daily  . pantoprazole  80 mg Oral Q1200  . polyethylene glycol  17 g Oral Daily  . ranolazine  1,000 mg Oral BID  . simvastatin  20 mg Oral QHS   Continuous Infusions:    Time spent: 25 minutes  Ardel Jagger L  [facility] [email or web address], password [facility] 12/27/2015, 10:05 AM  LOS: 4 days

## 2015-12-28 LAB — GLUCOSE, CAPILLARY
GLUCOSE-CAPILLARY: 159 mg/dL — AB (ref 65–99)
GLUCOSE-CAPILLARY: 197 mg/dL — AB (ref 65–99)
GLUCOSE-CAPILLARY: 343 mg/dL — AB (ref 65–99)
GLUCOSE-CAPILLARY: 237 mg/dL — AB (ref 65–99)

## 2015-12-28 LAB — CULTURE, BLOOD (ROUTINE X 2)
CULTURE: NO GROWTH
Culture: NO GROWTH

## 2015-12-28 LAB — BASIC METABOLIC PANEL
ANION GAP: 11 (ref 5–15)
BUN: 35 mg/dL — ABNORMAL HIGH (ref 6–20)
CALCIUM: 8 mg/dL — AB (ref 8.9–10.3)
CHLORIDE: 91 mmol/L — AB (ref 101–111)
CO2: 33 mmol/L — ABNORMAL HIGH (ref 22–32)
CREATININE: 1.78 mg/dL — AB (ref 0.61–1.24)
GFR calc non Af Amer: 33 mL/min — ABNORMAL LOW (ref >60)
GFR, EST AFRICAN AMERICAN: 38 mL/min — AB (ref >60)
Glucose, Bld: 189 mg/dL — ABNORMAL HIGH (ref 65–99)
Potassium: 3.7 mmol/L (ref 3.5–5.1)
SODIUM: 135 mmol/L (ref 135–145)

## 2015-12-28 MED ORDER — IPRATROPIUM-ALBUTEROL 0.5-2.5 (3) MG/3ML IN SOLN: 3.0000 mL | RESPIRATORY_TRACT | Status: DC | PRN

## 2015-12-28 NOTE — Progress Notes (Signed)
[facility] PROGRESS NOTE  [name] WUJ:[id number] DOB: 12-25-1927 DOA: 12/23/2015 PCP: Gwen Pounds, MD  Overnight: Able to remain off bipap  Assessment/Plan:  Principal Problem:   Acute on chronic respiratory failure with hypoxia (HCC): secondary to CHF and HCAP. CXR unchanged. Change azithromycin to po. Continue IV lasix, and cefepime. Off bipap. Hopefully to SNF Monday if stable Active Problems:   CAD (coronary artery disease)   Essential hypertension   Diabetes mellitus with neurological manifestation (HCC): no further hypoglycemia.   Dyslipidemia   Acute on chronic diastolic CHF (congestive heart failure) (HCC): echo a month ago difficult study but near normal ejection fraction, could not evaluate diastolic function. Weight on admission was 201 pounds. A month ago, ranged 175-198 and at discharge was 190. 192 today.   CKD (chronic kidney disease), stage III. Creatinine stable   HCAP (healthcare-associated pneumonia): see above   Anemia, chronic: monitor. No signs of bleeding Physical therapy recommending skilled nursing facility. Patient agreeable  Updated wife at bedside  HPI/Subjective: No complaints. No problems per nursing.  Objective: Filed Vitals:   12/28/15 0556 12/28/15 0848  BP:  135/60  Pulse: 78 86  Temp:    Resp: 27 22    Intake/Output Summary (Last 24 hours) at 12/28/15 1024 Last data filed at 12/28/15 0930  Gross per 24 hour  Intake    720 ml  Output   1300 ml  Net   -580 ml   Filed Weights   12/26/15 0441 12/27/15 0431 12/28/15 0400  Weight: 87.454 kg (192 lb 12.8 oz) 87.045 kg (191 lb 14.4 oz) 87.635 kg (193 lb 3.2 oz)    Exam:   General:  A and o. Comfortable getting bath. Fairly flat  Cardiovascular: RRR without MGR  Respiratory: Diminished throughout without wheezes rhonchi. Rales noted anteriorly left chest  Abdomen: S, NT, ND  Ext: Trace pitting edema.  Basic Metabolic Panel:  Recent Labs Lab 12/24/15 0535  12/25/15 0223 12/26/15 0420 12/27/15 0408 12/28/15 0423  NA 141 138 140 139 135  K 4.0 4.0 3.7 3.6 3.7  CL 102 96* 95* 92* 91*  CO2 29 32 36* 37* 33*  GLUCOSE 95 273* 97 143* 189*  BUN 30* 31* 29* 31* 35*  CREATININE 1.44* 1.51* 1.76* 1.73* 1.78*  CALCIUM 8.3* 8.4* 8.4* 8.4* 8.0*   Liver Function Tests:  Recent Labs Lab 12/23/15 1050 12/23/15 2050  AST 24 23  ALT 18 18  ALKPHOS 52 41  BILITOT 0.4 0.6  PROT 5.8* 5.1*  ALBUMIN 2.7* 2.3*   No results for input(s): LIPASE, AMYLASE in the last 168 hours. No results for input(s): AMMONIA in the last 168 hours. CBC:  Recent Labs Lab 12/23/15 1050 12/23/15 2050 12/24/15 0535 12/25/15 0223 12/27/15 0408  WBC 9.7 8.6 7.9 7.1 7.8  NEUTROABS 7.6 6.6  --   --   --   HGB 9.6* 9.0* 8.1* 8.8* 8.6*  HCT 30.1* 27.2* 24.6* 26.6* 25.8*  MCV 101.3* 100.7* 99.6 99.3 99.2  PLT 325 260 249 282 267   Cardiac Enzymes: No results for input(s): CKTOTAL, CKMB, CKMBINDEX, TROPONINI in the last 168 hours. BNP (last 3 results)  Recent Labs  11/20/15 1325 12/23/15 1333 12/25/15 0223  BNP 1407.2* 460.5* 849.6*    ProBNP (last 3 results) No results for input(s): PROBNP in the last 8760 hours.  CBG:  Recent Labs Lab 12/27/15 0748 12/27/15 1103 12/27/15 1632 12/27/15 2116 12/28/15 0749  GLUCAP 177* 228* 209* 230* 159*  Recent Results (from the past 240 hour(s))  Culture, blood (routine x 2)     Status: None (Preliminary result)   Collection Time: 12/23/15 10:50 AM  Result Value Ref Range Status   Specimen Description BLOOD LEFT ANTECUBITAL  Final   Special Requests BOTTLES DRAWN AEROBIC AND ANAEROBIC  Final   Culture NO GROWTH 4 DAYS  Final   Report Status PENDING  Incomplete  Culture, blood (routine x 2)     Status: None (Preliminary result)   Collection Time: 12/23/15 10:55 AM  Result Value Ref Range Status   Specimen Description BLOOD RIGHT ANTECUBITAL  Final   Special Requests BOTTLES DRAWN AEROBIC AND  ANAEROBIC  Final   Culture NO GROWTH 4 DAYS  Final   Report Status PENDING  Incomplete  Urine culture     Status: None   Collection Time: 12/23/15 12:33 PM  Result Value Ref Range Status   Specimen Description URINE, RANDOM  Final   Special Requests NONE  Final   Culture NO GROWTH 1 DAY  Final   Report Status 12/24/2015 FINAL  Final  MRSA PCR Screening     Status: None   Collection Time: 12/23/15  5:47 PM  Result Value Ref Range Status   MRSA by PCR NEGATIVE NEGATIVE Final    Comment:        The GeneXpert MRSA Assay (FDA approved for NASAL specimens only), is one component of a comprehensive MRSA colonization surveillance program. It is not intended to diagnose MRSA infection nor to guide or monitor treatment for MRSA infections.      Studies: Dg Chest 2 View  12/27/2015  CLINICAL DATA:  Pneumonia for 1 month.  CHF. 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 Cardiac and mediastinal contours are largely obscured. There is chronic elevation of the right hemidiaphragm. Diffuse bilateral airspace opacities involves the left greater than right lungs and have not significantly changed. No large pleural effusion or pneumothorax is identified. IMPRESSION: Unchanged diffuse bilateral airspace disease. Electronically Signed   By: Sebastian Ache M.D.   On: 12/27/2015 09:34    Scheduled Meds: . albuterol  5 mg Nebulization Once  . antiseptic oral rinse  7 mL Mouth Rinse BID  . aspirin EC  81 mg Oral Daily  . azithromycin  500 mg Oral Daily  . ceFEPime (MAXIPIME) IV  1 g Intravenous Q24H  . clopidogrel  75 mg Oral Daily  . fenofibrate  160 mg Oral Daily  . furosemide  40 mg Intravenous BID  . gabapentin  300 mg Oral BID  . heparin  5,000 Units Subcutaneous 3 times per day  . insulin aspart  0-9 Units Subcutaneous TID WC  . insulin glargine  12 Units Subcutaneous QHS  . ipratropium-albuterol  3 mL Nebulization TID  .  isosorbide mononitrate  15 mg Oral Daily  . pantoprazole  80 mg Oral Q1200  . polyethylene glycol  17 g Oral Daily  . ranolazine  1,000 mg Oral BID  . simvastatin  20 mg Oral QHS   Continuous Infusions:    Time spent: 15 minutes  Tashaun Obey L  [facility] [email or web address], password [facility] 12/28/2015, 10:24 AM  LOS: 5 days

## 2015-12-29 LAB — BASIC METABOLIC PANEL
Anion gap: 10 (ref 5–15)
BUN: 33 mg/dL — ABNORMAL HIGH (ref 6–20)
CO2: 36 mmol/L — ABNORMAL HIGH (ref 22–32)
Calcium: 8.4 mg/dL — ABNORMAL LOW (ref 8.9–10.3)
Chloride: 91 mmol/L — ABNORMAL LOW (ref 101–111)
Creatinine, Ser: 1.65 mg/dL — ABNORMAL HIGH (ref 0.61–1.24)
GFR calc Af Amer: 42 mL/min — ABNORMAL LOW (ref >60)
GFR, EST NON AFRICAN AMERICAN: 36 mL/min — AB (ref >60)
Glucose, Bld: 221 mg/dL — ABNORMAL HIGH (ref 65–99)
Potassium: 3.6 mmol/L (ref 3.5–5.1)
Sodium: 137 mmol/L (ref 135–145)

## 2015-12-29 LAB — GLUCOSE, CAPILLARY
Glucose-Capillary: 210 mg/dL — ABNORMAL HIGH (ref 65–99)
Glucose-Capillary: 218 mg/dL — ABNORMAL HIGH (ref 65–99)

## 2015-12-29 MED ORDER — INSULIN GLARGINE 100 UNIT/ML ~~LOC~~ SOLN: 20.0000 [IU] | Freq: Every day | SUBCUTANEOUS | Status: AC

## 2015-12-29 MED ORDER — AZITHROMYCIN 500 MG PO TABS: 500.0000 mg | ORAL_TABLET | Freq: Every day | ORAL | Status: AC

## 2015-12-29 MED ORDER — FUROSEMIDE 40 MG PO TABS: 40.0000 mg | ORAL_TABLET | Freq: Every day | ORAL | Status: AC

## 2015-12-29 MED ORDER — LORAZEPAM 0.5 MG PO TABS: 0.2500 mg | ORAL_TABLET | ORAL | Status: AC | PRN

## 2015-12-29 MED ORDER — LORAZEPAM 0.5 MG PO TABS: 0.2500 mg | ORAL_TABLET | ORAL | Status: DC | PRN

## 2015-12-29 MED ORDER — ZOLPIDEM TARTRATE 5 MG PO TABS: 5.0000 mg | ORAL_TABLET | Freq: Every evening | ORAL | Status: AC | PRN

## 2015-12-29 MED ORDER — HYDROCOD POLST-CPM POLST ER 10-8 MG/5ML PO SUER: 5.0000 mL | Freq: Two times a day (BID) | ORAL | Status: AC | PRN

## 2015-12-29 NOTE — Progress Notes (Signed)
ReDS Vest Discharge Study  Results of ReDS reading  Your patient is in the Blinded arm of the Vest at Discharge study.  Your patient has had a ReDS Vest reading and the reading has been transmitted to the cloud.  Your patient is ok for discharge.    Thank You   The research team    

## 2015-12-29 NOTE — Progress Notes (Signed)
Called report to [facility] at [facility].  All questions answered.

## 2015-12-29 NOTE — Care Management Note (Signed)
Case Management Note  Patient Details  Name: [name] MRN: [id number] Date of Birth: 1927/12/08  Subjective/Objective:  Pt admitted for Acute on Chronic Respiratory Failure. Plan is for d/c to SNF today.                   Action/Plan: CSW assisting with disposition needs. No further needs from CM at this time.   Expected Discharge Date:  12/29/15               Expected Discharge Plan:  Skilled Nursing Facility  In-House Referral:  Clinical Social Work  Discharge planning Services  CM Consult  Post Acute Care Choice:  NA Choice offered to:  NA  DME Arranged:  N/A DME Agency:  NA  HH Arranged:  NA HH Agency:  NA  Status of Service:  Completed, signed off  Medicare Important Message Given:  Yes Date Medicare IM Given:    Medicare IM give by:    Date Additional Medicare IM Given:    Additional Medicare Important Message give by:     If discussed at Long Length of Stay Meetings, dates discussed:    Additional Comments:  Gala Lewandowsky, RN 12/29/2015, 12:13 PM

## 2015-12-29 NOTE — Clinical Social Work Placement (Signed)
   CLINICAL SOCIAL WORK PLACEMENT  NOTE  Date:  12/29/2015  Patient Details  Name: MONTIE SWIDERSKI MRN: [id number] Date of Birth: [birth year]  Clinical Social Work is seeking post-discharge placement for this patient at the Skilled  Nursing Facility level of care (*CSW will initial, date and re-position this form in  chart as items are completed):  Yes   Patient/family provided with [facility] Clinical Social Work Department's list of facilities offering this level of care within the geographic area requested by the patient (or if unable, by the patient's family).  Yes   Patient/family informed of their freedom to choose among providers that offer the needed level of care, that participate in Medicare, Medicaid or managed care program needed by the patient, have an available bed and are willing to accept the patient.  Yes   Patient/family informed of [facility]'s ownership interest in [facility] and [facility], as well as of the fact that they are under no obligation to receive care at these facilities.  PASRR submitted to EDS on       PASRR number received on       Existing PASRR number confirmed on 12/26/15     FL2 transmitted to all facilities in geographic area requested by pt/family on 12/26/15     FL2 transmitted to all facilities within larger geographic area on       Patient informed that his/her managed care company has contracts with or will negotiate with certain facilities, including the following:        Yes   Patient/family informed of bed offers received.  Patient chooses bed at [facility]     Physician recommends and patient chooses bed at      Patient to be transferred to [facility] on 12/29/15.  Patient to be transferred to facility by [facility]     Patient family notified on 12/29/15 of transfer.  Name of family member notified:  Venita Sheffield Clonch     PHYSICIAN Please prepare prescriptions     Additional Comment:    Per MD, patient ready to discharge to [facility]. RN, patient/family, and facility notified of patient's discharge. RN given number for report. DC packet on chart. Ambulance transport requested for 1:30pm. Family has completed necessary paperwork. BSW intern signing off.     _______________________________________________ Jenita Seashore BSW Intern, [phone]

## 2015-12-29 NOTE — Care Management Important Message (Signed)
Important Message  Patient Details  Name: CARNELIUS HAMMITT MRN: [id number] Date of Birth: November 20, 1928   Medicare Important Message Given:  Yes    Rayvon Char 12/29/2015, 12:35 PMImportant Message  Patient Details  Name: GRANITE GODMAN MRN: [id number] Date of Birth: 26-Dec-1927   Medicare Important Message Given:  Yes    Alexandro Line G 12/29/2015, 12:35 PM

## 2015-12-29 NOTE — Progress Notes (Signed)
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  PMHx:  DM, HLD, CAD, MI with CABG.    PT Comments    [name] presents w/ generalized fatigue.  Pt educated on benefits of upright posture for lung capacity and pt completed therapeutic exercises sitting EOB.  Mod +2 assist for safe transfer to recliner chair from bed.  Pt will benefit from continued skilled PT services to increase functional independence and safety.   Follow Up Recommendations  SNF     Equipment Recommendations  Rolling walker with 5" wheels    Recommendations for Other Services       Precautions / Restrictions Precautions Precautions: Fall Precaution Comments: suggest use of RW during stand pivot Restrictions Weight Bearing Restrictions: No    Mobility  Bed Mobility Overal bed mobility: Needs Assistance Bed Mobility: Supine to Sit     Supine to sit: Mod assist;+2 for physical assistance;HOB elevated     General bed mobility comments: Assist provided to trunk and use of bed pad for positioning.    Transfers Overall transfer level: Needs assistance Equipment used: Rolling walker (2 wheeled) Transfers: Sit to/from [employer] Sit to Stand: +2 physical assistance;Mod assist Stand pivot transfers: +2 physical assistance;Mod assist       General transfer comment: Assist to boost up to standing, Lt knee blocked due to slight knee buckle. PT unable to stand upright despite verbal and tactile cues and facilitation (says this is his baseline).  Assist to steady during stand pivot to recliner chair.  Ambulation/Gait                 Stairs            Wheelchair Mobility    Modified Rankin (Stroke Patients Only)       Balance Overall balance assessment: Needs  assistance Sitting-balance support: Bilateral upper extremity supported;Feet supported Sitting balance-Leahy Scale: Fair Sitting balance - Comments: Pt sat EOB x8 minutes w/ Bil UE to complete therapeutic exercises and to work on posture   Standing balance support: Bilateral upper extremity supported;During functional activity Standing balance-Leahy Scale: Poor                      Cognition Arousal/Alertness: Lethargic Behavior During Therapy: Flat affect Overall Cognitive Status: Within Functional Limits for tasks assessed                      Exercises General Exercises - Upper Extremity Shoulder Flexion: AROM;Both;10 reps;Seated General Exercises - Lower Extremity Ankle Circles/Pumps: AROM;Both;10 reps;Seated Long Arc Quad: AROM;Both;10 reps;Seated    General Comments General comments (skin integrity, edema, etc.): Assistance provided w/ pt sitting in recliner chair to facilitate upright posture.  Pt educated on importance of upright posture for incresaed lung capacity, pt verbalized understanding.  Pt encouraged to continue working on upright posture throughout the day sitting in recliner chair.      Pertinent Vitals/Pain Pain Assessment: No/denies pain Faces Pain Scale: No hurt Pain Intervention(s): Monitored during session    Home Living                      Prior Function            PT Goals (current goals can now  be found in the care plan section) Acute Rehab PT Goals Patient Stated Goal: to be able to stand PT Goal Formulation: With patient/family Time For Goal Achievement: 01/07/16 Potential to Achieve Goals: Good Progress towards PT goals: Progressing toward goals (very modestly)    Frequency  Min 2X/week    PT Plan Current plan remains appropriate    Co-evaluation             End of Session Equipment Utilized During Treatment: Gait belt;Oxygen Activity Tolerance: Patient limited by fatigue Patient left: with call  bell/phone within reach;Other (comment);in chair (w/ Heart Failure team in room to complete evaluation)     Time: 4259-5638 PT Time Calculation (min) (ACUTE ONLY): 19 min  Charges:  $Therapeutic Exercise: 8-22 mins                    G Codes:      Michail Jewels PT, [place] [phone] Pager: [phone] 12/29/2015, 11:25 AM

## 2015-12-29 NOTE — Progress Notes (Signed)
Daily Progress Note   Patient Name: [name]       Date: 12/29/2015 DOB: May 16, 1928  Age: 82 y.o. MRN#: [id number] Attending Physician: Christiane Ha, MD Primary Care Physician: Gwen Pounds, MD Admit Date: 12/23/2015  Reason for Consultation/Follow-up: Establishing goals of care  Subjective: Talked with patient's wife Venita Sheffield.  She discussed an article she is reading about new treatments at [facility] for Pleural Effusion.  She is also concerned with patient's current code status.  Venita Sheffield' has know several people that survived CPR.  People that per her report were physically worse than Mr. Wronski.  She requests his code status be adjusted again so that he will not be intubated, but that he can receive CPR & cardioversion if needed.  She says she can rest much easier if this change is made.  Interval Events: Patient being discharged to SNF today.  Still very weak / deconditioned.  Still with chronic respiratory compromise.   Length of Stay: 6 days  Current Medications: Scheduled Meds:  . albuterol  5 mg Nebulization Once  . antiseptic oral rinse  7 mL Mouth Rinse BID  . aspirin EC  81 mg Oral Daily  . azithromycin  500 mg Oral Daily  . ceFEPime (MAXIPIME) IV  1 g Intravenous Q24H  . clopidogrel  75 mg Oral Daily  . fenofibrate  160 mg Oral Daily  . gabapentin  300 mg Oral BID  . heparin  5,000 Units Subcutaneous 3 times per day  . insulin aspart  0-9 Units Subcutaneous TID WC  . insulin glargine  12 Units Subcutaneous QHS  . isosorbide mononitrate  15 mg Oral Daily  . pantoprazole  80 mg Oral Q1200  . polyethylene glycol  17 g Oral Daily  . ranolazine  1,000 mg Oral BID  . simvastatin  20 mg Oral QHS    Continuous Infusions:    PRN Meds: bisacodyl,  chlorpheniramine-HYDROcodone, fentaNYL (SUBLIMAZE) injection, guaiFENesin-dextromethorphan, ipratropium-albuterol, LORazepam, ondansetron **OR** ondansetron (ZOFRAN) IV, traMADol, zolpidem   Physical Exam             Patient working with Physical Therapy.  Lying in bed moving his legs. Alert and orientated.  Vital Signs: BP 123/54 mmHg  Pulse 74  Temp(Src) 98.5 F (36.9 C) (Oral)  Resp 27  Ht 6' (1.829  m)  Wt 86.365 kg (190 lb 6.4 oz)  BMI 25.82 kg/m2  SpO2 95% SpO2: SpO2: 95 % O2 Device: O2 Device: Nasal Cannula O2 Flow Rate: O2 Flow Rate (L/min): 4 L/min  Intake/output summary:  Intake/Output Summary (Last 24 hours) at 12/29/15 1105 Last data filed at 12/29/15 0500  Gross per 24 hour  Intake    120 ml  Output   1750 ml  Net  -1630 ml   LBM: Last BM Date: 12/26/15 Baseline Weight: Weight: 91.315 kg (201 lb 5 oz) Most recent weight: Weight: 86.365 kg (190 lb 6.4 oz)       Palliative Assessment/Data: Flowsheet Rows        Most Recent Value   Intake Tab    Referral Department  Hospitalist   Unit at Time of Referral  Cardiac/Telemetry Unit   Palliative Care Primary Diagnosis  Sepsis/Infectious Disease   Date Notified  12/25/15   Palliative Care Type  New Palliative care   Reason for referral  Non-pain Symptom, Clarify Goals of Care, Counsel Regarding Hospice   Date of Admission  12/25/15   Date first seen by Palliative Care  12/25/15   # of days Palliative referral response time  0 Day(s)   # of days IP prior to Palliative referral  0   Clinical Assessment    Palliative Performance Scale Score  30%   Dyspnea Max Last 24 Hours  9   Dyspnea Min Last 24 hours  2   Anxiety Max Last 24 Hours  5   Anxiety Min Last 24 Hours  2   Psychosocial & Spiritual Assessment    Palliative Care Outcomes    Patient/Family meeting held?  Yes   Who was at the meeting?  patient and wife   Palliative Care Outcomes  Counseled regarding hospice, Changed CPR status   Patient/Family wishes:  Interventions discontinued/not started   Mechanical Ventilation   Palliative Care follow-up planned  Yes, Facility      Additional Data Reviewed: CBC    Component Value Date/Time   WBC 7.8 12/27/2015 0408   RBC 2.60* 12/27/2015 0408   HGB 8.6* 12/27/2015 0408   HCT 25.8* 12/27/2015 0408   PLT 267 12/27/2015 0408   MCV 99.2 12/27/2015 0408   MCH 33.1 12/27/2015 0408   MCHC 33.3 12/27/2015 0408   RDW 15.5 12/27/2015 0408   LYMPHSABS 1.1 12/23/2015 2050   MONOABS 0.9 12/23/2015 2050   EOSABS 0.0 12/23/2015 2050   BASOSABS 0.0 12/23/2015 2050    CMP     Component Value Date/Time   NA 137 12/29/2015 0602   K 3.6 12/29/2015 0602   CL 91* 12/29/2015 0602   CO2 36* 12/29/2015 0602   GLUCOSE 221* 12/29/2015 0602   BUN 33* 12/29/2015 0602   CREATININE 1.65* 12/29/2015 0602   CALCIUM 8.4* 12/29/2015 0602   PROT 5.1* 12/23/2015 2050   ALBUMIN 2.3* 12/23/2015 2050   AST 23 12/23/2015 2050   ALT 18 12/23/2015 2050   ALKPHOS 41 12/23/2015 2050   BILITOT 0.6 12/23/2015 2050   GFRNONAA 36* 12/29/2015 0602   GFRAA 42* 12/29/2015 0602       Problem List:  Patient Active Problem List   Diagnosis Date Noted  . Palliative care encounter   . Advanced directives, counseling/discussion   . Encounter for hospice care discussion   . Chronic diastolic heart failure (HCC) 12/23/2015  . Sepsis (HCC) 12/23/2015  . HCAP (healthcare-associated pneumonia) 12/23/2015  . Anemia 12/23/2015  . Acute  on chronic respiratory failure with hypoxia (HCC) 12/23/2015  . Pressure ulcer 11/13/2015  . Acute on chronic diastolic CHF (congestive heart failure) (HCC)   . Acute respiratory failure with hypoxia (HCC)   . CHF (congestive heart failure) (HCC) 11/12/2015  . Elevated troponin 11/12/2015  . CKD (chronic kidney disease), stage III 11/12/2015  . Altered mental state 11/12/2015  . NSTEMI (non-ST elevated myocardial infarction) (HCC) 11/12/2015  . Acute congestive heart failure (HCC)   .  Dyslipidemia 11/28/2014  . Type II or unspecified type diabetes mellitus with peripheral circulatory disorders, not stated as uncontrolled(250.70) 07/30/2014  . CAP (community acquired pneumonia) 10/30/2011  . Fracture of femoral neck, right (HCC) 10/28/2011  . Essential hypertension 10/28/2011  . Diabetes mellitus with neurological manifestation (HCC) 10/28/2011  . CAD (coronary artery disease) 08/06/2011     Palliative Care Assessment & Plan    1.Code Status:  Partial Code.    Code Status Orders        Start     Ordered   12/29/15 1030  Limited resuscitation (code)   Continuous    Question Answer Comment  In the event of cardiac or respiratory ARREST: Initiate Code Blue, Call Rapid Response Yes   In the event of cardiac or respiratory ARREST: Perform CPR Yes   In the event of cardiac or respiratory ARREST: Perform Intubation/Mechanical Ventilation No   In the event of cardiac or respiratory ARREST: Use NIPPV/BiPAp only if indicated Yes   In the event of cardiac or respiratory ARREST: Administer ACLS medications if indicated Yes   In the event of cardiac or respiratory ARREST: Perform Defibrillation or Cardioversion if indicated Yes      12/29/15 1031    Code Status History    Date Active Date Inactive Code Status Order ID Comments User Context   12/26/2015 11:10 AM 12/29/2015 10:31 AM Partial Code [id number]  Stephani Police, PA-C Inpatient   12/25/2015  3:59 PM 12/26/2015 11:10 AM Partial Code [id number]  Phineas Inches, RN Inpatient   12/25/2015  3:34 PM 12/25/2015  3:59 PM DNR [id number]  Stephani Police, PA-C Inpatient   12/23/2015  1:25 PM 12/25/2015  3:34 PM Partial Code [id number]  Ozella Rocks, MD ED   12/23/2015 12:41 PM 12/23/2015  1:25 PM Full Code [id number]  Gwenyth Bender, NP ED   11/13/2015  3:53 PM 11/24/2015  6:34 PM Full Code [id number]  Lennette Bihari, MD Inpatient   11/12/2015  3:15 AM 11/13/2015  3:53 PM Full Code [id number]  Alberteen Sam, MD Inpatient   10/29/2011   8:27 PM 11/02/2011  2:13 PM Full Code [id number]  Estill Dooms, RN Inpatient    Advance Directive Documentation        Most Recent Value   Type of Advance Directive  Healthcare Power of Attorney, Living will   Pre-existing out of facility DNR order (yellow form or pink MOST form)     "MOST" Form in Place?         Prognosis: Given recurrent pneumonia and progressively worsening cardiac function and deconditioning, patient likely has less than 6 months to live. He is at high risk for an acute event  Discharge Planning:  SNF with Palliative Medicine.    Thank you for allowing the Palliative Medicine Team to assist in the care of this patient.   Time In:  1045 Time Out: 1100 Total Time 15 min Prolonged Time Billed no        Algis Downs,  PA-C Palliative Medicine Pager: [phone]  12/29/2015, 11:05 AM  Please contact Palliative Medicine Team phone at [phone] for questions and concerns.

## 2015-12-29 NOTE — Plan of Care (Signed)
Problem: Phase II Progression Outcomes Goal: O2 sats > equal to 90% on RA or at baseline Outcome: Completed/Met Date Met:  12/29/15 Pt o2 sats high 90s on 5 L [place] Goal: Pain controlled on oral analgesia Outcome: Completed/Met Date Met:  12/29/15 Pt remains free of pain at this moment  Goal: Dyspnea controlled w/progressive activity Outcome: Progressing Pt still having some dyspnea with activity  Goal: Tolerating diet Outcome: Completed/Met Date Met:  12/29/15 Pt able to tolerate current diet with no difficulties  Goal: Discharge plan remains appropriate-arrangements made Outcome: Completed/Met Date Met:  12/29/15 Pt will be discharged to SNF today   Problem: Discharge Progression Outcomes Goal: O2 sats > or equal 90% or at baseline Outcome: Completed/Met Date Met:  12/29/15 Pt 02 sats above 90 on 5L [place] Goal: Discharge plan in place and appropriate Outcome: Completed/Met Date Met:  12/29/15 Pt will be discharged to SNF today  Goal: Pain controlled with appropriate interventions Outcome: Completed/Met Date Met:  12/29/15 Pt remains free from pain at this time Goal: Tolerating diet Outcome: Completed/Met Date Met:  12/29/15 Pt currently able to tolerate diet with no difficulties

## 2015-12-29 NOTE — Discharge Summary (Signed)
Physician Discharge Summary  [name] OZH:[id number] DOB: 24-Jun-1928 DOA: 12/23/2015  PCP: Dylan Pounds, MD  Admit date: 12/23/2015 Discharge date: 12/29/2015  Time spent: greater than 30 minutes  Recommendations for Outpatient Follow-up:  1. Monitor daily weights 2. Monitor CBG qac, hs 3. Monitor BMET periodically   Discharge Diagnoses:  Principal Problem:   Acute on chronic respiratory failure with hypoxia (HCC) secondary to acute heart failure and pneumonia Active Problems:   CAD (coronary artery disease)   Essential hypertension   Diabetes mellitus with neurological manifestation (HCC)   Dyslipidemia   CKD (chronic kidney disease), stage III    acute on Chronic diastolic heart failure (HCC)   HCAP (healthcare-associated pneumonia)   Anemia   Palliative care encounter   Advanced directives, counseling/discussion   Encounter for hospice care discussion   Discharge Condition: stable   Diet recommendation: Carbohydrate modified heart healthy   Filed Weights   12/27/15 0431 12/28/15 0400 12/29/15 0512  Weight: 87.045 kg (191 lb 14.4 oz) 87.635 kg (193 lb 3.2 oz) 86.365 kg (190 lb 6.4 oz)    History of present illness:  82 y.o. male with a past medical history that includes hypertension, diabetes, CAD status post MI 4 weeks ago complicated by pneumonia, chronic kidney disease presents to the emergency department from home with the chief complaint of worsening shortness of breath and increased cough and generalized weakness. Initial evaluation in the emergency department reveals acute respiratory failure with hypoxia likely related to healthcare associated pneumonia  Information is obtained from the wife who is at the bedside. She reports her husband recently discharged from cardiac rehabilitation on 4 L of nasal cannula and did well until this morning. This morning when he was ambulating she noted increased dyspnea with exertion and generalized weakness. She denies any  recent complaints of chest pain palpitation headache dizziness syncope or near-syncope. He denies any complaints of chills. She reports his appetite has been good he's been eating and drinking well. No diarrhea no vomiting. Reports some increase in lower extremity edema  Hospital Course:  Admitted to step down. Started on broad-spectrum antibiotics. Shortly after admission required BiPAP for increased work of breathing. BNP was elevated and weight noted to be above discharge weight last admission. Started on IV Lasix. Was able to come off BiPAP. Remains on his chronic oxygen, ranging 3-5 L. Progress was slow but steady. At discharge he feels his shortness of breath is at baseline. He has worked with physical therapy who recommend short-term skilled nursing facility and patient is agreeable. While here, palliative medicine worked with the patient. He is a partial code: Medications only. No CPR defibrillation or intubation. He was not interested in hospice but should he continue to decline this can be revisited in the future. Admission weight was about 201 Barron. At discharge, weight is 190, likely his dry weight. Would continue to monitor weights and basic metabolic panel at skilled nursing facility. I have increased his Lasix at discharge from 10 mg daily to 40 mg daily. Would adjust if needed to keep weight about 190.   He did have hypoglycemia during his hospitalization and his diabetic regimen was adjusted. This will need to be monitored at skilled nursing facility.  Procedures:  BiPAP   Consultations:  Palliative medicine  Discharge Exam: Filed Vitals:   12/29/15 0512 12/29/15 0739  BP: 147/58 123/54  Pulse: 76 74  Temp: 98.4 F (36.9 C) 98.5 F (36.9 C)  Resp: 28 27    General: comfortable.  Eating breakfast.  Cardiovascular: regular rate rhythm without murmurs gallops rubs Respiratory: clear to auscultation bilaterally without wheezes rhonchi or rales Extremities: No clubbing  cyanosis or edema  Discharge Instructions    Current Discharge Medication List    START taking these medications   Details  chlorpheniramine-HYDROcodone (TUSSIONEX) 10-8 MG/5ML SUER Take 5 mLs by mouth every 12 (twelve) hours as needed for cough. Qty: 115 mL, Refills: 0    LORazepam (ATIVAN) 0.5 MG tablet Take 0.5 tablets (0.25 mg total) by mouth every 4 (four) hours as needed for anxiety or sleep. Qty: 10 tablet, Refills: 0      CONTINUE these medications which have CHANGED   Details  furosemide (LASIX) 40 MG tablet Take 1 tablet (40 mg total) by mouth daily.    insulin glargine (LANTUS) 100 UNIT/ML injection Inject 0.2 mLs (20 Units total) into the skin at bedtime. Qty: 10 mL, Refills: 11    zolpidem (AMBIEN) 5 MG tablet Take 1 tablet (5 mg total) by mouth at bedtime as needed for sleep. Qty: 10 tablet, Refills: 0      CONTINUE these medications which have NOT CHANGED   Details  albuterol (PROVENTIL) (2.5 MG/3ML) 0.083% nebulizer solution Take 3 mLs (2.5 mg total) by nebulization every 2 (two) hours as needed for wheezing or shortness of breath. Qty: 75 mL, Refills: 12    aspirin EC 81 MG tablet Take 1 tablet (81 mg total) by mouth daily.    atenolol (TENORMIN) 50 MG tablet TAKE 1 TABLET BY MOUTH DAILY Qty: 90 tablet, Refills: 3    bisacodyl (DULCOLAX) 10 MG suppository Place 1 suppository (10 mg total) rectally daily as needed for moderate constipation. Qty: 12 suppository, Refills: 0    esomeprazole (NEXIUM) 40 MG capsule Take 40 mg by mouth daily before breakfast.      fenofibrate 160 MG tablet Take 160 mg by mouth daily.      gabapentin (NEURONTIN) 300 MG capsule Take 300 mg by mouth 2 (two) times daily.     isosorbide mononitrate (IMDUR) 30 MG 24 hr tablet TAKE 1 TABLET (30 MG TOTAL) BY MOUTH DAILY. Qty: 90 tablet, Refills: 3    nystatin (MYCOSTATIN) 100000 UNIT/ML suspension Take 5 mLs (500,000 Units total) by mouth 4 (four) times daily. Qty: 60 mL, Refills:  0    polyethylene glycol (MIRALAX / GLYCOLAX) packet Take 17 g by mouth daily. Qty: 14 each, Refills: 0    RANEXA 1000 MG SR tablet TAKE 1 TABLET BY MOUTH TWICE A DAY Qty: 60 tablet, Refills: 11    simvastatin (ZOCOR) 20 MG tablet TAKE 1 TABLET BY MOUTH AT BEDTIME Qty: 30 tablet, Refills: 6    traMADol (ULTRAM) 50 MG tablet Take 1 tablet (50 mg total) by mouth every 6 (six) hours as needed for moderate pain. Qty: 15 tablet, Refills: 0    clopidogrel (PLAVIX) 75 MG tablet Take 1 tablet (75 mg total) by mouth daily.    !! insulin aspart (NOVOLOG) 100 UNIT/ML injection Inject 0-5 Units into the skin at bedtime. Qty: 10 mL, Refills: 11    !! insulin aspart (NOVOLOG) 100 UNIT/ML injection Inject 0-9 Units into the skin 3 (three) times daily with meals. Qty: 10 mL, Refills: 11     !! - Potential duplicate medications found. Please discuss with provider.    STOP taking these medications     amLODipine (NORVASC) 5 MG tablet      colchicine 0.6 MG tablet      insulin lispro (  HUMALOG KWIKPEN) 100 UNIT/ML KiwkPen      ipratropium-albuterol (DUONEB) 0.5-2.5 (3) MG/3ML SOLN      predniSONE (DELTASONE) 50 MG tablet        Allergies  Allergen Reactions  . Morphine And Related     Altered mental status  . Tetracyclines & Related     "Raw throat"      The results of significant diagnostics from this hospitalization (including imaging, microbiology, ancillary and laboratory) are listed below for reference.    Significant Diagnostic Studies: Dg Chest 2 View  12/27/2015  CLINICAL DATA:  Pneumonia for 1 month.  CHF. 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 Cardiac and mediastinal contours are largely obscured. There is chronic elevation of the right hemidiaphragm. Diffuse bilateral airspace opacities involves the left greater than right lungs and have not significantly changed. No large pleural effusion or pneumothorax is  identified. IMPRESSION: Unchanged diffuse bilateral airspace disease. Electronically Signed   By: Sebastian Ache M.D.   On: 12/27/2015 09:34   Ct Angio Chest Pe W/cm &/or Wo Cm  12/23/2015  CLINICAL DATA:  Hypoxia, evaluate for pulmonary embolus, pneumonia EXAM: CT ANGIOGRAPHY CHEST WITH CONTRAST TECHNIQUE: Multidetector CT imaging of the chest was performed using the standard protocol during bolus administration of intravenous contrast. Multiplanar CT image reconstructions and MIPs were obtained to evaluate the vascular anatomy. CONTRAST:  80mL OMNIPAQUE IOHEXOL 350 MG/ML SOLN COMPARISON:  Chest x-ray same day and CT chest 03/05/2009 FINDINGS: Images of the thoracic inlet shows 1.2 cm low-density nodule left lobe thyroid gland. On the prior exam this measures 8 mm. A right pretracheal lymph node measures 1 cm short-axis borderline by size criteria. A lower pretracheal/precarinal lymph node measures 1.8 x 1 cm. AP window lymph node measures 1.5 cm by short-axis. A left mediastinal lymph node just lateral to pulmonary artery measures 1.6 cm by 1.3 cm. No significant hilar adenopathy. The study is of excellent technical quality. No pulmonary emboli are identified. The visualized upper abdomen shows calcified gallstone in dependent gallbladder measures 1.4 cm. No adrenal gland mass is noted in visualized upper abdomen. Mild bilateral perihilar peribronchial thickening. There is patchy peripheral infiltrate in left upper lobe and left lower lobe. Findings suspicious for multifocal bronchopneumonia. Mild peripheral interstitial prominence bilateral upper lobe suspicious for peripheral interstitial edema or interstitial pneumonia . There is small right pleural effusion. Right basilar atelectasis or infiltrate is noted. The patient is status post median sternotomy and CABG. Sagittal images of the spine shows diffuse osteopenia and mild degenerative changes thoracic spine. Review of the MIP images confirms the above  findings. IMPRESSION: 1. No pulmonary embolus is noted. 2. Patchy infiltrate/pneumonia noted in left upper lobe and left lower lobe. Mild peripheral interstitial prominence bilateral upper lobe and lingula suspicious for superimposed mild interstitial edema or pneumonitis. There is small right pleural effusion. Right base posterior atelectasis or infiltrate. 3. Borderline enlarged mediastinal lymph nodes probable reactive. 4. No aortic aneurysm. 5. Calcified gallstone within gallbladder measures 1.4 cm. 6. Mild perihilar bronchitic changes. 7. Status post CABG. Electronically Signed   By: Natasha Mead M.D.   On: 12/23/2015 12:36   Dg Chest Port 1 View  12/25/2015  CLINICAL DATA:  82 year old male with shortness of Breath. Cough and fever. Evidence of pneumonia on CTA chest. EXAM: PORTABLE CHEST 1 VIEW COMPARISON:  12/23/2015 and earlier. FINDINGS: Seated upright Portable AP view at 0739 hours. Continued widespread coarse and confluent pulmonary opacity,  and this remains increased compared to the 1039 hours study on 12/23/2015. Most of the mediastinal contours are obscured. Prior CABG. Chronically low lung volumes. No pneumothorax. No large effusion. IMPRESSION: Continued widespread coarse and confluent pulmonary opacity superimposed on chronically low lung volumes. Appearance has mildly progressed since 12/23/2015. Electronically Signed   By: Odessa Fleming M.D.   On: 12/25/2015 08:00   Dg Chest Port 1 View  12/23/2015  CLINICAL DATA:  Cough, fever. EXAM: PORTABLE CHEST 1 VIEW COMPARISON:  Same day. FINDINGS: Stable cardiomegaly is noted. Status post coronary artery bypass graft. No pneumothorax or significant pleural effusion is noted. Slightly increased bilateral lung opacities are noted, with left greater than right. This is concerning for pneumonia or possibly edema. Bony thorax is unremarkable. IMPRESSION: Slightly increased bilateral lung opacities are noted, left greater than right. This is concerning for  pneumonia or possibly edema. Electronically Signed   By: Lupita Raider, M.D.   On: 12/23/2015 18:24   Dg Chest Portable 1 View  12/23/2015  CLINICAL DATA:  Shortness of breath, fever EXAM: PORTABLE CHEST 1 VIEW COMPARISON:  11/22/2015 FINDINGS: Cardiomegaly again noted. Status post CABG. Chronic elevation of the right hemidiaphragm. There is mild interstitial prominence and hazy airspace opacification in left lung suspicious for asymmetric edema or pneumonitis. Mild right perihilar streaky atelectasis or infiltrate. IMPRESSION: There is mild interstitial prominence and hazy airspace opacification in left lung suspicious for asymmetric edema or pneumonitis. Mild right perihilar streaky atelectasis or infiltrate. Electronically Signed   By: Natasha Mead M.D.   On: 12/23/2015 10:48    Microbiology: Recent Results (from the past 240 hour(s))  Culture, blood (routine x 2)     Status: None   Collection Time: 12/23/15 10:50 AM  Result Value Ref Range Status   Specimen Description BLOOD LEFT ANTECUBITAL  Final   Special Requests BOTTLES DRAWN AEROBIC AND ANAEROBIC  Final   Culture NO GROWTH 5 DAYS  Final   Report Status 12/28/2015 FINAL  Final  Culture, blood (routine x 2)     Status: None   Collection Time: 12/23/15 10:55 AM  Result Value Ref Range Status   Specimen Description BLOOD RIGHT ANTECUBITAL  Final   Special Requests BOTTLES DRAWN AEROBIC AND ANAEROBIC  Final   Culture NO GROWTH 5 DAYS  Final   Report Status 12/28/2015 FINAL  Final  Urine culture     Status: None   Collection Time: 12/23/15 12:33 PM  Result Value Ref Range Status   Specimen Description URINE, RANDOM  Final   Special Requests NONE  Final   Culture NO GROWTH 1 DAY  Final   Report Status 12/24/2015 FINAL  Final  MRSA PCR Screening     Status: None   Collection Time: 12/23/15  5:47 PM  Result Value Ref Range Status   MRSA by PCR NEGATIVE NEGATIVE Final    Comment:        The GeneXpert MRSA Assay  (FDA approved for NASAL specimens only), is one component of a comprehensive MRSA colonization surveillance program. It is not intended to diagnose MRSA infection nor to guide or monitor treatment for MRSA infections.      Labs: Basic Metabolic Panel:  Recent Labs Lab 12/25/15 0223 12/26/15 0420 12/27/15 0408 12/28/15 0423 12/29/15 0602  NA 138 140 139 135 137  K 4.0 3.7 3.6 3.7 3.6  CL 96* 95* 92* 91* 91*  CO2 32 36* 37* 33* 36*  GLUCOSE 273* 97 143* 189* 221*  BUN 31* 29* 31* 35* 33*  CREATININE 1.51* 1.76* 1.73* 1.78* 1.65*  CALCIUM 8.4* 8.4* 8.4* 8.0* 8.4*   Liver Function Tests:  Recent Labs Lab 12/23/15 1050 12/23/15 2050  AST 24 23  ALT 18 18  ALKPHOS 52 41  BILITOT 0.4 0.6  PROT 5.8* 5.1*  ALBUMIN 2.7* 2.3*   No results for input(s): LIPASE, AMYLASE in the last 168 hours. No results for input(s): AMMONIA in the last 168 hours. CBC:  Recent Labs Lab 12/23/15 1050 12/23/15 2050 12/24/15 0535 12/25/15 0223 12/27/15 0408  WBC 9.7 8.6 7.9 7.1 7.8  NEUTROABS 7.6 6.6  --   --   --   HGB 9.6* 9.0* 8.1* 8.8* 8.6*  HCT 30.1* 27.2* 24.6* 26.6* 25.8*  MCV 101.3* 100.7* 99.6 99.3 99.2  PLT 325 260 249 282 267   Cardiac Enzymes: No results for input(s): CKTOTAL, CKMB, CKMBINDEX, TROPONINI in the last 168 hours. BNP: BNP (last 3 results)  Recent Labs  11/20/15 1325 12/23/15 1333 12/25/15 0223  BNP 1407.2* 460.5* 849.6*    ProBNP (last 3 results) No results for input(s): PROBNP in the last 8760 hours.  CBG:  Recent Labs Lab 12/28/15 0749 12/28/15 1156 12/28/15 1629 12/28/15 2046 12/29/15 0737  GLUCAP 159* 197* 343* 237* 210*       Signed:  Christiane Ha MD  [facility] 12/29/2015, 9:08 AM

## 2016-02-26 ENCOUNTER — Encounter: Payer: Self-pay | Admitting: Cardiovascular Disease

## 2016-02-26 ENCOUNTER — Ambulatory Visit (INDEPENDENT_AMBULATORY_CARE_PROVIDER_SITE_OTHER): Payer: Medicare Other | Admitting: Cardiovascular Disease

## 2016-02-26 VITALS — BP 122/58 | HR 52 | Ht 69.0 in | Wt 208.0 lb

## 2016-02-26 DIAGNOSIS — R600 Localized edema: Secondary | ICD-10-CM

## 2016-02-26 DIAGNOSIS — I5033 Acute on chronic diastolic (congestive) heart failure: Secondary | ICD-10-CM

## 2016-02-26 DIAGNOSIS — I2510 Atherosclerotic heart disease of native coronary artery without angina pectoris: Secondary | ICD-10-CM | POA: Diagnosis not present

## 2016-02-26 LAB — BASIC METABOLIC PANEL
BUN: 39 mg/dL — ABNORMAL HIGH (ref 7–25)
CALCIUM: 9.1 mg/dL (ref 8.6–10.3)
CO2: 31 mmol/L (ref 20–31)
Chloride: 98 mmol/L (ref 98–110)
Creat: 1.65 mg/dL — ABNORMAL HIGH (ref 0.70–1.11)
GLUCOSE: 277 mg/dL — AB (ref 65–99)
Potassium: 4.6 mmol/L (ref 3.5–5.3)
SODIUM: 139 mmol/L (ref 135–146)

## 2016-02-26 NOTE — Progress Notes (Signed)
[name]  Date of Birth  16-Dec-1927 [facility]  [address] , [address] [phone]  Fax    Problem List 1. CAD - s/p CABG 2. Peripheral neuropathy 3. HTN 4. Hyperlipidemia 5. Diabetes Mellitus  History of Present Illness:  82 year old gentleman with a history of coronary artery disease and coronary artery bypass grafting. He has a history of hypertension.  He's not had any significant episodes of chest pain or shortness of breath. He does get short of breath he tries to walk too quickly or walks without his walker.  Ranexa is fairly expensive and he asked if it was absolutely necessary.    He walks with the assistance of a walker because of some low back pain. He's able to get around in his house without a walker fairly well. He had right hip replacement this past year. He's not had any significant pain.   Nov. 6, 2014:  Mr. [name] is doing well.  Getting around with his walker ( has balance problems and back pain)  No cardiac issues.   April 24, 2014:  [name] is doing ok.  Still using the walker for balance.  Has neuropathy.   No Cp , no dyspnea.   07/30/2014:  Mr. [name] is an 33 gentleman with history history of CAD. He's been experiencing some mid sternal chest discomfort. This typically occurs when he wakes up and then when he goes to bed.  The pain lasts for a few minutes. He has not had to take any nitroglycerin.  Despite having these pains are typically occur in the morning and also in the evening, he's able to do all his normal activities each day without CP or angina.  . He walks with the assistance of a walker which is primarily for balance.    He has some spinal issues and diabetic neuropathy.  He does have shortness breath with exertion. This resolves when he stops and sits down and rests for a few minutes.  Jan. 7, 2016:  Mr. [name] is doing well.  Hx of CAD.  We increaed his Imdur to 60 but this caused him to feel poorly. He cut it back to  30 and now feels well. hes having some peripheral neuropathy in his legs.   He is having some DOE - this seems to be worse than before.  May 28, 2015:  Mr. [name] is seen for a follow up visit.   His CP are much better after increasing his Ranexa and adding Imdur.  Only trouble is his legs.  Has neuropathic pain .   February 26, 2016 : [name] is seen today for follow up visit.  He was in the hospital twice  for community acquired pneumonia  Had a NSTEMI ,  Cath revealed severe diffuse disease.   Medical therapy was advised.  Still very short of breath. Is now on home O2.  Still coughing up clear sputum     Current Outpatient Prescriptions  Medication Sig Dispense Refill  . aspirin EC 81 MG tablet Take 1 tablet (81 mg total) by mouth daily.    Marland Kitchen atenolol (TENORMIN) 50 MG tablet TAKE 1 TABLET BY MOUTH DAILY 90 tablet 3  . chlorpheniramine-HYDROcodone (TUSSIONEX) 10-8 MG/5ML SUER Take 5 mLs by mouth every 12 (twelve) hours as needed for cough. 115 mL 0  . clopidogrel (PLAVIX) 75 MG tablet Take 1 tablet (75 mg total) by mouth daily.    Marland Kitchen esomeprazole (NEXIUM) 40 MG capsule Take 40  mg by mouth daily before breakfast.      . fenofibrate 160 MG tablet Take 160 mg by mouth daily.      . furosemide (LASIX) 40 MG tablet Take 1 tablet (40 mg total) by mouth daily. (Patient taking differently: Take 80 mg by mouth as directed. )    . gabapentin (NEURONTIN) 300 MG capsule Take 300 mg by mouth 2 (two) times daily.     . insulin aspart (NOVOLOG) 100 UNIT/ML injection Inject 0-5 Units into the skin at bedtime. 10 mL 11  . insulin aspart (NOVOLOG) 100 UNIT/ML injection Inject 0-9 Units into the skin 3 (three) times daily with meals. 10 mL 11  . insulin glargine (LANTUS) 100 UNIT/ML injection Inject 0.2 mLs (20 Units total) into the skin at bedtime. 10 mL 11  . isosorbide mononitrate (IMDUR) 30 MG 24 hr tablet TAKE 1 TABLET (30 MG TOTAL) BY MOUTH DAILY. 90 tablet 3  . polyethylene glycol (MIRALAX /  GLYCOLAX) packet Take 17 g by mouth daily. (Patient taking differently: Take 17 g by mouth daily as needed for mild constipation. ) 14 each 0  . RANEXA 1000 MG SR tablet TAKE 1 TABLET BY MOUTH TWICE A DAY 60 tablet 11  . simvastatin (ZOCOR) 20 MG tablet TAKE 1 TABLET BY MOUTH AT BEDTIME 30 tablet 6  . albuterol (PROVENTIL) (2.5 MG/3ML) 0.083% nebulizer solution Take 3 mLs (2.5 mg total) by nebulization every 2 (two) hours as needed for wheezing or shortness of breath. 75 mL 12  . azithromycin (ZITHROMAX) 500 MG tablet Take 1 tablet (500 mg total) by mouth daily. Through 2/9, then stop  0  . bisacodyl (DULCOLAX) 10 MG suppository Place 1 suppository (10 mg total) rectally daily as needed for moderate constipation. 12 suppository 0  . LORazepam (ATIVAN) 0.5 MG tablet Take 0.5 tablets (0.25 mg total) by mouth every 4 (four) hours as needed for anxiety or sleep. 10 tablet 0  . nystatin (MYCOSTATIN) 100000 UNIT/ML suspension Take 5 mLs (500,000 Units total) by mouth 4 (four) times daily. 60 mL 0  . traMADol (ULTRAM) 50 MG tablet Take 1 tablet (50 mg total) by mouth every 6 (six) hours as needed for moderate pain. 15 tablet 0  . zolpidem (AMBIEN) 5 MG tablet Take 1 tablet (5 mg total) by mouth at bedtime as needed for sleep. 10 tablet 0  . [DISCONTINUED] ferrous sulfate 325 (65 FE) MG tablet Take 1 tablet (325 mg total) by mouth 3 (three) times daily after meals.     No current facility-administered medications for this visit.     Allergies  Allergen Reactions  . Morphine And Related     Altered mental status  . Tetracyclines & Related     "Raw throat"    Past Medical History  Diagnosis Date  . Hypertension   . Diabetes mellitus   . Dyslipidemia   . Coronary artery disease     status post CABG. He is also status post PTCA and stenting of the circumflex artery  . Heart murmur   . Renal calculi   . 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 Cataract   . Myocardial  infarction (HCC)   . NSTEMI (non-ST elevated myocardial infarction) (HCC) 11/12/2015  . Chronic respiratory failure (HCC)   . HCAP (healthcare-associated pneumonia)   . Shortness of breath dyspnea     Past Surgical History  Procedure Laterality Date  . Lithotripsy    . [place] echocardiography  01-16-09  EF 55-60  . Cardiovascular stress test  09-26-03    EF 43%  . Cardiac catheterization    . Coronary artery bypass graft      CABG X 4  . Coronary angioplasty with stent placement    . Tonsillectomy    . Cataract extraction w/ intraocular lens implant      right  . Back surgery      x6  . Total hip arthroplasty  10/29/2011    Procedure: TOTAL HIP ARTHROPLASTY;  Surgeon: Shelda Pal;  Location: [facility];  Service: Orthopedics;  Laterality: Right;  . Joint replacement    . Eye surgery    . Spine surgery    . Cardiac catheterization N/A 11/13/2015    Procedure: Left Heart Cath and Cors/Grafts Angiography;  Surgeon: Lennette Bihari, MD;  Location: [facility] INVASIVE CV LAB;  Service: Cardiovascular;  Laterality: N/A;    History  Smoking status  . Former Smoker  . Types: Pipe  Smokeless tobacco  . Never Used    History  Alcohol Use No    Family History  Problem Relation Age of Onset  . Pneumonia Father     Deceased age 34    Reviw of Systems:  Reviewed in the HPI.  All other systems are negative.  Physical Exam: BP 122/58 mmHg  Pulse 52  Ht  (1.753 m)  Wt 208 lb (94.348 kg)  BMI 30.70 kg/m2 The patient is alert and oriented x 3.  The mood and affect are normal.   Skin: warm and dry.  Color is normal.    HEENT:   the sclera are nonicteric.  The mucous membranes are moist.  The carotids are 2+ without bruits.  There is no thyromegaly.  There is no JVD.    Lungs: clear.  The chest wall is non tender.    Heart: regular rate with a normal S1 and S2.  There is a soft systolic murmur. The PMI is not displaced.     Abdomen: good bowel sounds.  There is no guarding or  rebound.  There is no hepatosplenomegaly or tenderness.  There are no masses.   Extremities:  no clubbing, cyanosis,  Trace - 1+ edema.  The legs are without rashes.  The distal pulses are intact.   Neuro:  Cranial nerves II - XII are intact.  Motor and sensory functions are intact.    He walks with the assistance of a walker.  ECG: NSR at 65.  RAD    Assessment / Plan:   1. CAD - s/p CABG- he has severe diffuse disease By cath in Dec. 2016.    No PCI was done .  Medical therapy was advised.  Continue current meds.   2. Leg edema  - likely due to his inactivity  - spends most of his time in chair / wheelchair.    Advised compression hose, leg elevation .  He has been on lasix 80 a day for the past week Will check BMP today  He may may diastolic dysfunction .   His LV function is well preserved.   3. HTN-  blood pressures typically well controlled.   Off the amlodipine On lisinopril - which will be added to his list   4. Hyperlipidemia-   Will DC fenofibrate   5. Diabetes Mellitus     Nahser, Deloris Ping, MD  02/26/2016 8:51 AM    [facility] [address] [place],  [address] [place],  [place]  [address] Pager [phone] Phone: [phone]; Fax: [phone]   [facility]  [address] [place], [place]  [address] [phone]    Fax [phone]

## 2016-02-26 NOTE — Patient Instructions (Signed)
Medication Instructions:  STOP Fenofibrate   Labwork: TODAY - basic metabolic panel   Testing/Procedures: None Ordered   Follow-Up: Your physician wants you to follow-up in: 3 months with Dr. Elease HashimotoNahser.  You will receive a reminder letter in the mail two months in advance. If you don't receive a letter, please call our office to schedule the follow-up appointment.   If you need a refill on your cardiac medications before your next appointment, please call your pharmacy.   Thank you for choosing [facility]! Eligha BridegroomMichelle Swinyer, RN [phone]

## 2016-03-17 ENCOUNTER — Other Ambulatory Visit: Payer: Self-pay | Admitting: Cardiovascular Disease

## 2016-03-26 ENCOUNTER — Other Ambulatory Visit: Payer: Self-pay

## 2016-03-26 ENCOUNTER — Other Ambulatory Visit (HOSPITAL_COMMUNITY): Payer: Self-pay | Admitting: Internal Medicine

## 2016-03-26 ENCOUNTER — Ambulatory Visit (HOSPITAL_COMMUNITY): Payer: Medicare Other | Attending: Cardiology

## 2016-03-26 ENCOUNTER — Other Ambulatory Visit: Payer: Self-pay | Admitting: *Deleted

## 2016-03-26 DIAGNOSIS — I110 Hypertensive heart disease with heart failure: Secondary | ICD-10-CM | POA: Insufficient documentation

## 2016-03-26 DIAGNOSIS — I5033 Acute on chronic diastolic (congestive) heart failure: Secondary | ICD-10-CM | POA: Diagnosis not present

## 2016-03-26 DIAGNOSIS — I071 Rheumatic tricuspid insufficiency: Secondary | ICD-10-CM | POA: Insufficient documentation

## 2016-03-26 DIAGNOSIS — I2510 Atherosclerotic heart disease of native coronary artery without angina pectoris: Secondary | ICD-10-CM | POA: Diagnosis not present

## 2016-03-26 DIAGNOSIS — I059 Rheumatic mitral valve disease, unspecified: Secondary | ICD-10-CM | POA: Insufficient documentation

## 2016-03-26 DIAGNOSIS — I509 Heart failure, unspecified: Secondary | ICD-10-CM | POA: Diagnosis present

## 2016-03-26 DIAGNOSIS — E785 Hyperlipidemia, unspecified: Secondary | ICD-10-CM | POA: Diagnosis not present

## 2016-03-26 DIAGNOSIS — Z951 Presence of aortocoronary bypass graft: Secondary | ICD-10-CM | POA: Insufficient documentation

## 2016-03-26 DIAGNOSIS — E119 Type 2 diabetes mellitus without complications: Secondary | ICD-10-CM | POA: Diagnosis not present

## 2016-03-26 MED ORDER — PERFLUTREN LIPID MICROSPHERE
3.0000 mL | Freq: Once | INTRAVENOUS | Status: AC
  Administered 2016-03-26: 3 mL via INTRAVENOUS

## 2016-03-28 ENCOUNTER — Encounter (HOSPITAL_COMMUNITY): Payer: Self-pay

## 2016-03-28 ENCOUNTER — Emergency Department (HOSPITAL_COMMUNITY)
Admission: EM | Admit: 2016-03-28 | Discharge: 2016-03-28 | Disposition: A | Discharge status: Home / Self Care | Payer: Medicare Other | Attending: Emergency Medicine | Admitting: Emergency Medicine

## 2016-03-28 DIAGNOSIS — Z7902 Long term (current) use of antithrombotics/antiplatelets: Secondary | ICD-10-CM | POA: Diagnosis not present

## 2016-03-28 DIAGNOSIS — Z8709 Personal history of other diseases of the respiratory system: Secondary | ICD-10-CM | POA: Insufficient documentation

## 2016-03-28 DIAGNOSIS — Z8669 Personal history of other diseases of the nervous system and sense organs: Secondary | ICD-10-CM | POA: Insufficient documentation

## 2016-03-28 DIAGNOSIS — Z9861 Coronary angioplasty status: Secondary | ICD-10-CM | POA: Diagnosis not present

## 2016-03-28 DIAGNOSIS — E785 Hyperlipidemia, unspecified: Secondary | ICD-10-CM | POA: Diagnosis not present

## 2016-03-28 DIAGNOSIS — R011 Cardiac murmur, unspecified: Secondary | ICD-10-CM | POA: Insufficient documentation

## 2016-03-28 DIAGNOSIS — Z87891 Personal history of nicotine dependence: Secondary | ICD-10-CM | POA: Diagnosis not present

## 2016-03-28 DIAGNOSIS — Z9889 Other specified postprocedural states: Secondary | ICD-10-CM | POA: Diagnosis not present

## 2016-03-28 DIAGNOSIS — Z79899 Other long term (current) drug therapy: Secondary | ICD-10-CM | POA: Insufficient documentation

## 2016-03-28 DIAGNOSIS — X100XXA Contact with hot drinks, initial encounter: Secondary | ICD-10-CM | POA: Insufficient documentation

## 2016-03-28 DIAGNOSIS — Z23 Encounter for immunization: Secondary | ICD-10-CM | POA: Diagnosis not present

## 2016-03-28 DIAGNOSIS — Y998 Other external cause status: Secondary | ICD-10-CM | POA: Diagnosis not present

## 2016-03-28 DIAGNOSIS — Z7982 Long term (current) use of aspirin: Secondary | ICD-10-CM | POA: Insufficient documentation

## 2016-03-28 DIAGNOSIS — T24212A Burn of second degree of left thigh, initial encounter: Secondary | ICD-10-CM | POA: Diagnosis present

## 2016-03-28 DIAGNOSIS — Z87442 Personal history of urinary calculi: Secondary | ICD-10-CM | POA: Diagnosis not present

## 2016-03-28 DIAGNOSIS — Y9289 Other specified places as the place of occurrence of the external cause: Secondary | ICD-10-CM | POA: Diagnosis not present

## 2016-03-28 DIAGNOSIS — I2510 Atherosclerotic heart disease of native coronary artery without angina pectoris: Secondary | ICD-10-CM | POA: Diagnosis not present

## 2016-03-28 DIAGNOSIS — Y9389 Activity, other specified: Secondary | ICD-10-CM | POA: Diagnosis not present

## 2016-03-28 DIAGNOSIS — E1140 Type 2 diabetes mellitus with diabetic neuropathy, unspecified: Secondary | ICD-10-CM | POA: Insufficient documentation

## 2016-03-28 DIAGNOSIS — Z8701 Personal history of pneumonia (recurrent): Secondary | ICD-10-CM | POA: Diagnosis not present

## 2016-03-28 DIAGNOSIS — I252 Old myocardial infarction: Secondary | ICD-10-CM | POA: Diagnosis not present

## 2016-03-28 DIAGNOSIS — Z951 Presence of aortocoronary bypass graft: Secondary | ICD-10-CM | POA: Diagnosis not present

## 2016-03-28 DIAGNOSIS — I10 Essential (primary) hypertension: Secondary | ICD-10-CM | POA: Diagnosis not present

## 2016-03-28 DIAGNOSIS — Z794 Long term (current) use of insulin: Secondary | ICD-10-CM | POA: Diagnosis not present

## 2016-03-28 MED ORDER — BACITRACIN ZINC 500 UNIT/GM EX OINT
TOPICAL_OINTMENT | Freq: Two times a day (BID) | CUTANEOUS | Status: DC
  Administered 2016-03-28: 1 via TOPICAL
  Filled 2016-03-28: qty 2.7

## 2016-03-28 MED ORDER — TRAMADOL HCL 50 MG PO TABS
50.0000 mg | ORAL_TABLET | Freq: Once | ORAL | Status: AC
  Administered 2016-03-28: 50 mg via ORAL
  Filled 2016-03-28: qty 1

## 2016-03-28 MED ORDER — TRAMADOL HCL 50 MG PO TABS: 50.0000 mg | ORAL_TABLET | Freq: Four times a day (QID) | ORAL | Status: AC | PRN

## 2016-03-28 MED ORDER — IBUPROFEN 200 MG PO TABS: 200.0000 mg | ORAL_TABLET | Freq: Once | ORAL | Status: DC

## 2016-03-28 MED ORDER — BACITRACIN ZINC 500 UNIT/GM EX OINT: 1.0000 "application " | TOPICAL_OINTMENT | Freq: Every day | CUTANEOUS | Status: AC

## 2016-03-28 MED ORDER — TETANUS-DIPHTH-ACELL PERTUSSIS 5-2.5-18.5 LF-MCG/0.5 IM SUSP
0.5000 mL | Freq: Once | INTRAMUSCULAR | Status: AC
  Administered 2016-03-28: 0.5 mL via INTRAMUSCULAR
  Filled 2016-03-28: qty 0.5
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
## 2016-03-28 NOTE — ED Notes (Signed)
PT discharged with instructions. Pt uses continuous home O2. This RN recommended pt use [facility] to transport to home seeing how pt does not have home O2 with him. Pt and wife insistent on driving home via private vehicle due to patient is able to go short amounts of time with out home O2.

## 2016-03-28 NOTE — Discharge Instructions (Signed)
It was our pleasure to provide your ER care today - we hope that you feel better.  Keep area very clean - apply thin coat of bacitracin to area 1-2x/day - before applying each new dressing, completely remove old ointment/cream/exudate, and wash area thoroughly/gently with warm water and soap, before applying new dressing.   You may take ultram as need for pain - no driving for the next 4 hours, or when taking ultram.  Follow up with your doctor for wound check in the coming week.   Return to ER if worse, new symptoms, fevers, severe pain, infection of wound, other concern.        Burn Care Your skin is a natural barrier to infection. It is the largest organ of your body. Burns damage this natural protection. To help prevent infection, it is very important to follow your caregiver's instructions in the care of your burn. Burns are classified as:  First degree. There is only redness of the skin (erythema). No scarring is expected.  Second degree. There is blistering of the skin. Scarring may occur with deeper burns.  Third degree. All layers of the skin are injured, and scarring is expected. HOME CARE INSTRUCTIONS   Wash your hands well before changing your bandage.  Change your bandage as often as directed by your caregiver.  Remove the old bandage. If the bandage sticks, you may soak it off with cool, clean water.  Cleanse the burn thoroughly but gently with mild soap and water.  Pat the area dry with a clean, dry cloth.  Apply a thin layer of antibacterial cream to the burn.  Apply a clean bandage as instructed by your caregiver.  Keep the bandage as clean and dry as possible.  Elevate the affected area for the first 24 hours, then as instructed by your caregiver.  Only take over-the-counter or prescription medicines for pain, discomfort, or fever as directed by your caregiver. SEEK IMMEDIATE MEDICAL CARE IF:   You develop excessive pain.  You develop redness,  tenderness, swelling, or red streaks near the burn.  The burned area develops yellowish-white fluid (pus) or a bad smell.  You have a fever. MAKE SURE YOU:   Understand these instructions.  Will watch your condition.  Will get help right away if you are not doing well or get worse.   This information is not intended to replace advice given to you by your health care provider. Make sure you discuss any questions you have with your health care provider.   Document Released: 11/08/2005 Document Revised: 01/31/2012 Document Reviewed: 03/31/2011 Elsevier Interactive Patient Education [employer]2016 [employer] Inc[employer]

## 2016-03-29 ENCOUNTER — Telehealth: Payer: Self-pay | Admitting: *Deleted

## 2016-03-29 NOTE — Telephone Encounter (Signed)
I spoke to patient on 03/25/16 for 597-month phone call for REDS@[email or web address]  Study. Patient has not been re-hospitalized in last 3 months. I thanked patient for his participation in the study.

## 2016-04-03 ENCOUNTER — Inpatient Hospital Stay (HOSPITAL_COMMUNITY): Payer: Medicare Other

## 2016-04-03 ENCOUNTER — Emergency Department (HOSPITAL_COMMUNITY): Payer: Medicare Other

## 2016-04-03 ENCOUNTER — Inpatient Hospital Stay (HOSPITAL_COMMUNITY)
Admission: EM | Admit: 2016-04-03 | Discharge: 2016-04-22 | DRG: 870 | Disposition: E | Payer: Medicare Other | Attending: Pulmonary Disease | Admitting: Pulmonary Disease

## 2016-04-03 ENCOUNTER — Encounter (HOSPITAL_COMMUNITY): Payer: Self-pay | Admitting: Emergency Medicine

## 2016-04-03 DIAGNOSIS — J9621 Acute and chronic respiratory failure with hypoxia: Secondary | ICD-10-CM | POA: Diagnosis present

## 2016-04-03 DIAGNOSIS — J189 Pneumonia, unspecified organism: Secondary | ICD-10-CM | POA: Diagnosis present

## 2016-04-03 DIAGNOSIS — Z955 Presence of coronary angioplasty implant and graft: Secondary | ICD-10-CM

## 2016-04-03 DIAGNOSIS — Z87891 Personal history of nicotine dependence: Secondary | ICD-10-CM | POA: Diagnosis not present

## 2016-04-03 DIAGNOSIS — Z7982 Long term (current) use of aspirin: Secondary | ICD-10-CM

## 2016-04-03 DIAGNOSIS — Z885 Allergy status to narcotic agent status: Secondary | ICD-10-CM

## 2016-04-03 DIAGNOSIS — G253 Myoclonus: Secondary | ICD-10-CM | POA: Diagnosis present

## 2016-04-03 DIAGNOSIS — Z66 Do not resuscitate: Secondary | ICD-10-CM | POA: Diagnosis present

## 2016-04-03 DIAGNOSIS — I340 Nonrheumatic mitral (valve) insufficiency: Secondary | ICD-10-CM

## 2016-04-03 DIAGNOSIS — Z951 Presence of aortocoronary bypass graft: Secondary | ICD-10-CM

## 2016-04-03 DIAGNOSIS — E785 Hyperlipidemia, unspecified: Secondary | ICD-10-CM | POA: Diagnosis present

## 2016-04-03 DIAGNOSIS — I5021 Acute systolic (congestive) heart failure: Secondary | ICD-10-CM | POA: Diagnosis present

## 2016-04-03 DIAGNOSIS — E1142 Type 2 diabetes mellitus with diabetic polyneuropathy: Secondary | ICD-10-CM | POA: Diagnosis present

## 2016-04-03 DIAGNOSIS — G931 Anoxic brain damage, not elsewhere classified: Secondary | ICD-10-CM | POA: Diagnosis present

## 2016-04-03 DIAGNOSIS — Z515 Encounter for palliative care: Secondary | ICD-10-CM | POA: Diagnosis not present

## 2016-04-03 DIAGNOSIS — J9600 Acute respiratory failure, unspecified whether with hypoxia or hypercapnia: Secondary | ICD-10-CM

## 2016-04-03 DIAGNOSIS — I252 Old myocardial infarction: Secondary | ICD-10-CM | POA: Diagnosis not present

## 2016-04-03 DIAGNOSIS — I959 Hypotension, unspecified: Secondary | ICD-10-CM | POA: Diagnosis present

## 2016-04-03 DIAGNOSIS — Z9842 Cataract extraction status, left eye: Secondary | ICD-10-CM

## 2016-04-03 DIAGNOSIS — N170 Acute kidney failure with tubular necrosis: Secondary | ICD-10-CM | POA: Diagnosis present

## 2016-04-03 DIAGNOSIS — E1143 Type 2 diabetes mellitus with diabetic autonomic (poly)neuropathy: Secondary | ICD-10-CM | POA: Diagnosis present

## 2016-04-03 DIAGNOSIS — T24312A Burn of third degree of left thigh, initial encounter: Secondary | ICD-10-CM | POA: Diagnosis present

## 2016-04-03 DIAGNOSIS — Y95 Nosocomial condition: Secondary | ICD-10-CM | POA: Diagnosis present

## 2016-04-03 DIAGNOSIS — Z7902 Long term (current) use of antithrombotics/antiplatelets: Secondary | ICD-10-CM | POA: Diagnosis not present

## 2016-04-03 DIAGNOSIS — Z961 Presence of intraocular lens: Secondary | ICD-10-CM | POA: Diagnosis present

## 2016-04-03 DIAGNOSIS — N179 Acute kidney failure, unspecified: Secondary | ICD-10-CM | POA: Diagnosis present

## 2016-04-03 DIAGNOSIS — A419 Sepsis, unspecified organism: Principal | ICD-10-CM | POA: Diagnosis present

## 2016-04-03 DIAGNOSIS — Z9841 Cataract extraction status, right eye: Secondary | ICD-10-CM | POA: Diagnosis not present

## 2016-04-03 DIAGNOSIS — I110 Hypertensive heart disease with heart failure: Secondary | ICD-10-CM | POA: Diagnosis present

## 2016-04-03 DIAGNOSIS — Z794 Long term (current) use of insulin: Secondary | ICD-10-CM

## 2016-04-03 DIAGNOSIS — I469 Cardiac arrest, cause unspecified: Secondary | ICD-10-CM | POA: Diagnosis present

## 2016-04-03 DIAGNOSIS — E1169 Type 2 diabetes mellitus with other specified complication: Secondary | ICD-10-CM | POA: Diagnosis present

## 2016-04-03 DIAGNOSIS — I2510 Atherosclerotic heart disease of native coronary artery without angina pectoris: Secondary | ICD-10-CM | POA: Diagnosis present

## 2016-04-03 DIAGNOSIS — Z96641 Presence of right artificial hip joint: Secondary | ICD-10-CM | POA: Diagnosis present

## 2016-04-03 DIAGNOSIS — Z79899 Other long term (current) drug therapy: Secondary | ICD-10-CM | POA: Diagnosis not present

## 2016-04-03 LAB — ECHOCARDIOGRAM COMPLETE: WEIGHTICAEL: 3460.34 [oz_av]

## 2016-04-03 LAB — I-STAT CHEM 8, ED
BUN: 70 mg/dL — ABNORMAL HIGH (ref 6–20)
Calcium, Ion: 1.11 mmol/L — ABNORMAL LOW (ref 1.13–1.30)
Chloride: 103 mmol/L (ref 101–111)
Creatinine, Ser: 1.9 mg/dL — ABNORMAL HIGH (ref 0.61–1.24)
Glucose, Bld: 235 mg/dL — ABNORMAL HIGH (ref 65–99)
HCT: 34 % — ABNORMAL LOW (ref 39.0–52.0)
Hemoglobin: 11.6 g/dL — ABNORMAL LOW (ref 13.0–17.0)
Potassium: 4.2 mmol/L (ref 3.5–5.1)
Sodium: 142 mmol/L (ref 135–145)
TCO2: 24 mmol/L (ref 0–100)

## 2016-04-03 LAB — CBC WITH DIFFERENTIAL/PLATELET
Basophils Absolute: 0 10*3/uL (ref 0.0–0.1)
Basophils Absolute: 0 10*3/uL (ref 0.0–0.1)
Basophils Relative: 0 %
Basophils Relative: 0 %
EOS ABS: 0.2 10*3/uL (ref 0.0–0.7)
Eosinophils Absolute: 0.1 10*3/uL (ref 0.0–0.7)
Eosinophils Relative: 1 %
Eosinophils Relative: 1 %
HCT: 32.3 % — ABNORMAL LOW (ref 39.0–52.0)
HEMATOCRIT: 31.7 % — AB (ref 39.0–52.0)
HEMOGLOBIN: 9.7 g/dL — AB (ref 13.0–17.0)
Hemoglobin: 9.8 g/dL — ABNORMAL LOW (ref 13.0–17.0)
LYMPHS ABS: 1.4 10*3/uL (ref 0.7–4.0)
LYMPHS PCT: 45 %
Lymphocytes Relative: 9 %
Lymphs Abs: 7.7 10*3/uL — ABNORMAL HIGH (ref 0.7–4.0)
MCH: 31.8 pg (ref 26.0–34.0)
MCH: 31.2 pg (ref 26.0–34.0)
MCHC: 30.3 g/dL (ref 30.0–36.0)
MCHC: 30.6 g/dL (ref 30.0–36.0)
MCV: 104.9 fL — ABNORMAL HIGH (ref 78.0–100.0)
MCV: 101.9 fL — ABNORMAL HIGH (ref 78.0–100.0)
MONO ABS: 1 10*3/uL (ref 0.1–1.0)
MONOS PCT: 6 %
Monocytes Absolute: 0.9 10*3/uL (ref 0.1–1.0)
Monocytes Relative: 6 %
NEUTROS ABS: 13.2 10*3/uL — AB (ref 1.7–7.7)
NEUTROS PCT: 48 %
NEUTROS PCT: 84 %
Neutro Abs: 8.3 10*3/uL — ABNORMAL HIGH (ref 1.7–7.7)
PLATELETS: 206 10*3/uL (ref 150–400)
PLATELETS: 173 10*3/uL (ref 150–400)
RBC: 3.08 MIL/uL — AB (ref 4.22–5.81)
RBC: 3.11 MIL/uL — ABNORMAL LOW (ref 4.22–5.81)
RDW: 14.3 % (ref 11.5–15.5)
RDW: 14.3 % (ref 11.5–15.5)
WBC: 17.2 10*3/uL — AB (ref 4.0–10.5)
WBC: 15.6 10*3/uL — ABNORMAL HIGH (ref 4.0–10.5)

## 2016-04-03 LAB — POCT I-STAT 3, ART BLOOD GAS (G3+)
BICARBONATE: 24 meq/L (ref 20.0–24.0)
O2 SAT: 99 %
PCO2 ART: 32.6 mmHg — AB (ref 35.0–45.0)
PO2 ART: 125 mmHg — AB (ref 80.0–100.0)
TCO2: 25 mmol/L (ref 0–100)
pH, Arterial: 7.47 — ABNORMAL HIGH (ref 7.350–7.450)

## 2016-04-03 LAB — I-STAT ARTERIAL BLOOD GAS, ED
ACID-BASE DEFICIT: 6 mmol/L — AB (ref 0.0–2.0)
BICARBONATE: 23 meq/L (ref 20.0–24.0)
O2 SAT: 100 %
PCO2 ART: 67.6 mmHg — AB (ref 35.0–45.0)
PO2 ART: 534 mmHg — AB (ref 80.0–100.0)
Patient temperature: 98
TCO2: 25 mmol/L (ref 0–100)
pH, Arterial: 7.138 — CL (ref 7.350–7.450)

## 2016-04-03 LAB — COMPREHENSIVE METABOLIC PANEL
ALBUMIN: 2.9 g/dL — AB (ref 3.5–5.0)
ALK PHOS: 57 U/L (ref 38–126)
ALK PHOS: 54 U/L (ref 38–126)
ALT: 113 U/L — AB (ref 17–63)
ALT: 125 U/L — ABNORMAL HIGH (ref 17–63)
ANION GAP: 20 — AB (ref 5–15)
ANION GAP: 17 — AB (ref 5–15)
AST: 129 U/L — ABNORMAL HIGH (ref 15–41)
AST: 153 U/L — ABNORMAL HIGH (ref 15–41)
Albumin: 2.9 g/dL — ABNORMAL LOW (ref 3.5–5.0)
BILIRUBIN TOTAL: 0.6 mg/dL (ref 0.3–1.2)
BUN: 66 mg/dL — ABNORMAL HIGH (ref 6–20)
BUN: 64 mg/dL — ABNORMAL HIGH (ref 6–20)
CALCIUM: 8.9 mg/dL (ref 8.9–10.3)
CALCIUM: 9 mg/dL (ref 8.9–10.3)
CO2: 18 mmol/L — ABNORMAL LOW (ref 22–32)
CO2: 23 mmol/L (ref 22–32)
CREATININE: 2.13 mg/dL — AB (ref 0.61–1.24)
Chloride: 104 mmol/L (ref 101–111)
Chloride: 103 mmol/L (ref 101–111)
Creatinine, Ser: 2.01 mg/dL — ABNORMAL HIGH (ref 0.61–1.24)
GFR calc Af Amer: 31 mL/min — ABNORMAL LOW (ref >60)
GFR calc non Af Amer: 27 mL/min — ABNORMAL LOW (ref >60)
GFR calc non Af Amer: 29 mL/min — ABNORMAL LOW (ref >60)
GFR, EST AFRICAN AMERICAN: 33 mL/min — AB (ref >60)
GLUCOSE: 246 mg/dL — AB (ref 65–99)
Glucose, Bld: 192 mg/dL — ABNORMAL HIGH (ref 65–99)
POTASSIUM: 4.8 mmol/L (ref 3.5–5.1)
Potassium: 4.3 mmol/L (ref 3.5–5.1)
SODIUM: 142 mmol/L (ref 135–145)
SODIUM: 143 mmol/L (ref 135–145)
TOTAL PROTEIN: 6.1 g/dL — AB (ref 6.5–8.1)
TOTAL PROTEIN: 6 g/dL — AB (ref 6.5–8.1)
Total Bilirubin: 0.7 mg/dL (ref 0.3–1.2)

## 2016-04-03 LAB — URINALYSIS, ROUTINE W REFLEX MICROSCOPIC
BILIRUBIN URINE: NEGATIVE
GLUCOSE, UA: 100 mg/dL — AB
HGB URINE DIPSTICK: NEGATIVE
KETONES UR: NEGATIVE mg/dL
LEUKOCYTES UA: NEGATIVE
Nitrite: NEGATIVE
PH: 5.5 (ref 5.0–8.0)
PROTEIN: NEGATIVE mg/dL
Specific Gravity, Urine: 1.016 (ref 1.005–1.030)

## 2016-04-03 LAB — GLUCOSE, CAPILLARY
GLUCOSE-CAPILLARY: 261 mg/dL — AB (ref 65–99)
GLUCOSE-CAPILLARY: 205 mg/dL — AB (ref 65–99)
Glucose-Capillary: 214 mg/dL — ABNORMAL HIGH (ref 65–99)
Glucose-Capillary: 240 mg/dL — ABNORMAL HIGH (ref 65–99)

## 2016-04-03 LAB — CG4 I-STAT (LACTIC ACID): LACTIC ACID, VENOUS: 4.3 mmol/L — AB (ref 0.5–2.0)

## 2016-04-03 LAB — I-STAT TROPONIN, ED: TROPONIN I, POC: 0.18 ng/mL — AB (ref 0.00–0.08)

## 2016-04-03 LAB — MRSA PCR SCREENING: MRSA by PCR: NEGATIVE

## 2016-04-03 LAB — LACTIC ACID, PLASMA: LACTIC ACID, VENOUS: 5.7 mmol/L — AB (ref 0.5–2.0)

## 2016-04-03 LAB — MAGNESIUM: Magnesium: 1.9 mg/dL (ref 1.7–2.4)

## 2016-04-03 LAB — TROPONIN I
TROPONIN I: 0.58 ng/mL — AB (ref <0.031)
TROPONIN I: 8.97 ng/mL — AB (ref <0.031)
Troponin I: 57.35 ng/mL (ref <0.031)

## 2016-04-03 LAB — PHOSPHORUS: Phosphorus: 6.4 mg/dL — ABNORMAL HIGH (ref 2.5–4.6)

## 2016-04-03 LAB — PROCALCITONIN: Procalcitonin: <0.1 ng/mL

## 2016-04-03 LAB — I-STAT CG4 LACTIC ACID, ED: LACTIC ACID, VENOUS: 9.33 mmol/L — AB (ref 0.5–2.0)

## 2016-04-03 LAB — STREP PNEUMONIAE URINARY ANTIGEN: STREP PNEUMO URINARY ANTIGEN: NEGATIVE

## 2016-04-03 LAB — CORTISOL: Cortisol, Plasma: 34.6 ug/dL

## 2016-04-03 MED ORDER — INSULIN ASPART 100 UNIT/ML ~~LOC~~ SOLN
2.0000 [IU] | SUBCUTANEOUS | Status: DC
  Administered 2016-04-03 (×2): 6 [IU] via SUBCUTANEOUS
  Administered 2016-04-04: 2 [IU] via SUBCUTANEOUS
  Administered 2016-04-04: 4 [IU] via SUBCUTANEOUS
  Administered 2016-04-05 (×2): 6 [IU] via SUBCUTANEOUS
  Administered 2016-04-05 (×2): 4 [IU] via SUBCUTANEOUS
  Administered 2016-04-05 (×2): 6 [IU] via SUBCUTANEOUS
  Administered 2016-04-05: 4 [IU] via SUBCUTANEOUS

## 2016-04-03 MED ORDER — PANTOPRAZOLE SODIUM 40 MG IV SOLR
40.0000 mg | Freq: Every day | INTRAVENOUS | Status: DC
  Administered 2016-04-03 – 2016-04-05 (×3): 40 mg via INTRAVENOUS
  Filled 2016-04-03 (×3): qty 40

## 2016-04-03 MED ORDER — SODIUM CHLORIDE 0.9 % IV SOLN
INTRAVENOUS | Status: DC
  Administered 2016-04-03 – 2016-04-04 (×2): via INTRAVENOUS

## 2016-04-03 MED ORDER — NOREPINEPHRINE BITARTRATE 1 MG/ML IV SOLN
0.0000 ug/min | INTRAVENOUS | Status: DC
  Administered 2016-04-03: 5 ug/min via INTRAVENOUS

## 2016-04-03 MED ORDER — SODIUM CHLORIDE 0.9 % IV SOLN: 250.0000 mL | INTRAVENOUS | Status: DC | PRN

## 2016-04-03 MED ORDER — ACETAMINOPHEN 650 MG RE SUPP: 650.0000 mg | RECTAL | Status: DC | PRN

## 2016-04-03 MED ORDER — SODIUM CHLORIDE 0.9 % IV SOLN
INTRAVENOUS | Status: AC | PRN
  Administered 2016-04-03: 1000 mL via INTRAVENOUS

## 2016-04-03 MED ORDER — ACETAMINOPHEN 160 MG/5ML PO SOLN
650.0000 mg | Freq: Four times a day (QID) | ORAL | Status: DC | PRN
  Administered 2016-04-03 – 2016-04-05 (×2): 650 mg via ORAL
  Filled 2016-04-03 (×2): qty 20.3

## 2016-04-03 MED ORDER — SODIUM CHLORIDE 0.9 % IV BOLUS (SEPSIS)
1000.0000 mL | Freq: Once | INTRAVENOUS | Status: AC
  Administered 2016-04-03: 1000 mL via INTRAVENOUS

## 2016-04-03 MED ORDER — ANTISEPTIC ORAL RINSE SOLUTION (CORINZ)
7.0000 mL | Freq: Four times a day (QID) | OROMUCOSAL | Status: DC
  Administered 2016-04-04 – 2016-04-08 (×18): 7 mL via OROMUCOSAL

## 2016-04-03 MED ORDER — VANCOMYCIN HCL IN DEXTROSE 1-5 GM/200ML-% IV SOLN
1000.0000 mg | Freq: Once | INTRAVENOUS | Status: AC
  Administered 2016-04-03: 1000 mg via INTRAVENOUS
  Filled 2016-04-03: qty 200

## 2016-04-03 MED ORDER — PIPERACILLIN-TAZOBACTAM 3.375 G IVPB
3.3750 g | Freq: Three times a day (TID) | INTRAVENOUS | Status: DC
  Administered 2016-04-03 – 2016-04-04 (×3): 3.375 g via INTRAVENOUS
  Filled 2016-04-03 (×5): qty 50

## 2016-04-03 MED ORDER — VANCOMYCIN HCL IN DEXTROSE 1-5 GM/200ML-% IV SOLN
1000.0000 mg | INTRAVENOUS | Status: DC
  Filled 2016-04-03: qty 200

## 2016-04-03 MED ORDER — CHLORHEXIDINE GLUCONATE 0.12% ORAL RINSE (MEDLINE KIT)
15.0000 mL | Freq: Two times a day (BID) | OROMUCOSAL | Status: DC
  Administered 2016-04-03 – 2016-04-08 (×10): 15 mL via OROMUCOSAL

## 2016-04-03 MED ORDER — HEPARIN SODIUM (PORCINE) 5000 UNIT/ML IJ SOLN
5000.0000 [IU] | Freq: Three times a day (TID) | INTRAMUSCULAR | Status: DC
  Administered 2016-04-03 – 2016-04-08 (×16): 5000 [IU] via SUBCUTANEOUS
  Filled 2016-04-03 (×20): qty 1

## 2016-04-03 MED ORDER — PIPERACILLIN-TAZOBACTAM 3.375 G IVPB 30 MIN
3.3750 g | Freq: Once | INTRAVENOUS | Status: AC
  Administered 2016-04-03: 3.375 g via INTRAVENOUS
  Filled 2016-04-03: qty 50

## 2016-04-03 MED ORDER — EPINEPHRINE HCL 0.1 MG/ML IJ SOSY
PREFILLED_SYRINGE | INTRAMUSCULAR | Status: AC | PRN
  Administered 2016-04-03 (×2): 1 via INTRAVENOUS

## 2016-04-03 MED FILL — Medication: Qty: 1 | Status: AC

## 2016-04-03 NOTE — ED Notes (Signed)
Report given to 2 M RN 

## 2016-04-03 NOTE — Progress Notes (Signed)
EEG completed, results pending. 

## 2016-04-03 NOTE — Progress Notes (Signed)
Patient transported to 2M03. No complications. Vital signs stable. Patient tolerated well. [facility] R[facility]. RT will continue to monitor.
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
## 2016-04-03 NOTE — Progress Notes (Signed)
CRITICAL VALUE ALERT  Critical value received: Troponin 8.97  Date of notification:  2016/05/31  Time of notification:  1226  Critical value read back:Yes.    Nurse who received alert:  Louie BunSummer Tressy Kunzman, RN  MD notified (1st page):  Dr. Vassie LollAlva  Time of first page:  1226  MD notified (2nd page):  Time of second page:  Responding MD:  Dr. Vassie LollAlva  Time MD responded:  1226

## 2016-04-03 NOTE — ED Notes (Signed)
Patient from home, got up to go to bathroom around 3am, collapsed in front of wife.  EMS was called, call came out around 0315.  Upon EMS arrival, patient found in Asystole, capnography of 70.  Patient given 4 doses of epi, got ROSC, epi drip started.  Patient lost pulses en route to ED, patient given 6 more epi en route to ED.  Initial 12 lead showed STEMI per EMS.  Patient arrival to ED, pulseless and apneic, assisted ventilations, lucas in place, CPR being performed.

## 2016-04-03 NOTE — Progress Notes (Signed)
   04/08/2016 0615  Clinical Encounter Type  Visited With Patient and family together;Health care provider  Visit Type Initial;Critical Care;ED;Spiritual support;Psychological support  Referral From Nurse  Spiritual Encounters  Spiritual Needs Prayer;Grief support;Emotional  Stress Factors  Family Stress Factors Health changes;Loss   Chaplain responded to support patient's wife, who arrived after patient experienced cardiac arrest this morning. Chaplain helped facilitate medical consultation, transition wife to patient's room, and offered support through empathic listening, facilitating a life review, and prayer. According to the wife, patient has two children ([place] in [place] and a daughter who is relationally distant). Wife expressed that she will try and call the son later this morning. Patient's wife has a daughter here [place], but the daughter has some serious health issues and has difficulty with mobility. Chaplain services available as needed.   Alda PonderAdam M Rohil Lesch, Chaplain 04/11/2016 6:25 AM

## 2016-04-03 NOTE — Procedures (Signed)
History: 82 yo M s/p cardiac arrest  Sedation: None  Technique: This is a 21 channel routine scalp EEG performed at the bedside with bipolar and monopolar montages arranged in accordance to the international 10/20 system of electrode placement. One channel was dedicated to EKG recording.    Background: The background consists of a burst suppression pattern with an interburst interval of 20 seconds - 5 minutes. The bursts are 1 -3 seconds of high amplitude  Theta intermixed with some epileptiform activity. This is associated with generalized myoclonus on video.   Photic stimulation: Physiologic driving is not performed  EEG Abnormalities: 1) burst suppression pattern 2) Bursts associated with generalized myoclonic jerks.   Clinical Interpretation: This EEG is consistent with a profound cerebral dysfunction. In the setting of post-cardiac arrest this EEG is highly correlated with severe anoxic injury with grim prognosis.   Ritta SlotMcNeill Braileigh Landenberger, MD [facility] [phone]  If 7pm- 7am, please page neurology on call as listed in AMION.

## 2016-04-03 NOTE — Progress Notes (Signed)
*  PRELIMINARY RESULTS* Echocardiogram 2D Echocardiogram has been performed.  Doristine SectionKristy H Aliannah Holstrom 2016/01/27, 12:32 PM

## 2016-04-03 NOTE — Progress Notes (Signed)
  Pharmacy Antibiotic Note  Dylan BennettRichard J Barron is a 82 y.o. male admitted on 04/06/2016 s/p cardiac arrest with recurrent PNA and VDRF.  Pharmacy has been consulted for Vancomycin and Zosyn  Dosing.  Vancomycin 1 g IV given in ED at  0520  Plan: Vancomycin 1 g IV again this am for total of 2 g, then 1 g IV q48h Zosyn 3.375 g IV q8h     Temp (24hrs), Avg:96.9 F (36.1 C), Min:96.9 F (36.1 C), Max:96.9 F (36.1 C)   Recent Labs Lab 04/18/2016 0423 04/01/2016 0429 04/19/2016 0438  WBC 17.2*  --   --   CREATININE 2.13* 1.90*  --   LATICACIDVEN  --   --  9.33*    CrCl cannot be calculated (Unknown ideal weight.).    Allergies  Allergen Reactions  . Morphine And Related     Altered mental status  . Tetracyclines & Related     "Raw throat"    [name], [name] 03/24/2016 6:01 AM

## 2016-04-03 NOTE — ED Notes (Signed)
Patient transported to CT scan with RT . Spouse speaking with chaplain .

## 2016-04-03 NOTE — Progress Notes (Signed)
Nutrition Brief Note  New ventilated patient identified. Per notes, pt with severe anoxic brain injury and grim prognosis. Wife had stated the pt would not want to be supported like this and palliative is involved.    TF is likely not to be needed.   No nutrition interventions warranted at this time. If nutrition issues arise, please consult RD.   Dylan LouisNathan Franks RD, LDN Clinical Nutrition Pager: [phone] 05-01-2016 2:02 PM

## 2016-04-03 NOTE — Consult Note (Signed)
Consultation Note Date: 04/17/2016   Patient Name: [name]  DOB: 26-Aug-1928  MRN: [id number]  Age / Sex: 82 y.o., male  PCP: Shon Baton, MD Referring Physician: Marshell Garfinkel, MD  Reason for Consultation: Establishing goals of care, Hospice Evaluation and Psychosocial/spiritual support  HPI/Patient Profile: 82 y.o. male  with past medical history of Coronary artery disease, CABG, MI, peripheral neuropathy, hypertension, hyperlipidemia, diabetes, recent cardiac cath December 2016, recurrent pneumonia, congestive heart failure admitted on 03/25/2016 with acute MI. Patient had just been home after a stay at a skilled nursing facility for rehabilitation for approximately 6 weeks when according to his wife he called out for her last night and stated that he had fallen.  He had gotten up and gone to the bathroom and then had fallen. He and his wife were attempting to try to get him up off the floor when he slumped forward and stopped breathing. Per chart review it appears as though CPR was not administered for 15 minutes and he was found to be in asystole by EMS. He was given 4 doses of epinephrine but lost pulses again. He arrived  asystolic in the emergency room. He was intubated in the emergency room. He was hypotensive after intubation was started on Levophed.. EEG reveals profound cerebral dysfunction; severe anoxic brain injury. He has been running fevers of 100 to 173f  Clinical Assessment and Goals of Care: Patient is opening his eyes rather frequently. It does not appear purposeful but he has opened his eyes at times when his wife is called his name. She is struggling greatly as she considers that he is dying. They have been married only 8 years, and this relationship has been a second chance for both she and her husband. She tells me "I feel like he is in there and becoming more alert". I did share with her  the impression from the EEG that her husband had profound cerebral dysfunction and severe anoxic brain injury.  At this point in terms of goals of care my initial impression is that patient's spouse is going to require more time to see how her husband does.  NEXT OF KIN, his wife GRegino SchultzeRednar    SUMMARY OF RECOMMENDATIONS   Continue full treatment at this point. Anticipate family will need some time to gauge and to come to fully except his clinical condition before further decisions can be made Palliative medicine to stay involved and to address one way extubation when appropriate, feeding, and hospice Have a follow-up meeting scheduled with spouse for 04/04/2016 at 9 AM Code SRio  Limited code. We'll clarify this with critical care medicine. Per chart review this patient appears to be a DO NOT RESUSCITATE despite the fact that he is intubated    Symptom Management:   Pain: No current nonverbal signs and symptoms of pain this becomes part of his clinical presentation would recommend fentanyl IV when necessary 25-50 g every one hour when necessary  Agitation: No current agitation. If this becomes a clinical  concern would recommend Ativan or symptoms more severe Versed  Palliative Prophylaxis:   Bowel Regimen, Frequent Pain Assessment, Oral Care and Turn Reposition   Psycho-social/Spiritual:   Desire for further Chaplaincy support:no  Additional Recommendations: Grief/Bereavement Support  Prognosis:   < 4 weeks  Discharge Planning: To Be Determined      Primary Diagnoses: Present on Admission:  . Cardiac arrest ([place])  I have reviewed the medical record, interviewed the patient and family, and examined the patient. The following aspects are pertinent.  Past Medical History  Diagnosis Date  . Hypertension   . Diabetes mellitus   . Dyslipidemia   . Coronary artery disease     status post CABG. He is also status post PTCA and stenting of the  circumflex artery  . Heart murmur   . Renal calculi   . Peripheral autonomic neuropathy due to diabetes mellitus ([place])   . Cataract   . Myocardial infarction ([place])   . NSTEMI (non-ST elevated myocardial infarction) ([place]) 11/12/2015  . Chronic respiratory failure ([place])   . HCAP (healthcare-associated pneumonia)   . Shortness of breath dyspnea    Social History   Social History  . Marital Status: Married    Spouse Name: N/A  . Number of Children: N/A  . Years of Education: N/A   Social History Main Topics  . Smoking status: Former Smoker    Types: Pipe  . Smokeless tobacco: Never Used  . Alcohol Use: No  . Drug Use: No  . Sexual Activity: Yes   Other Topics Concern  . None   Social History Narrative   Family History  Problem Relation Age of Onset  . Pneumonia Father     Deceased age 82   Scheduled Meds: . [START ON 04/04/2016] antiseptic oral rinse  7 mL Mouth Rinse QID  . chlorhexidine gluconate (SAGE KIT)  15 mL Mouth Rinse BID  . heparin  5,000 Units Subcutaneous Q8H  . insulin aspart  2-6 Units Subcutaneous Q4H  . pantoprazole (PROTONIX) IV  40 mg Intravenous QHS  . piperacillin-tazobactam (ZOSYN)  IV  3.375 g Intravenous Q8H  . [START ON 04/04/2016] vancomycin  1,000 mg Intravenous Q24H   Continuous Infusions: . sodium chloride 100 mL/hr at 04/19/2016 1200  . norepinephrine (LEVOPHED) Adult infusion 2 mcg/min (03/24/2016 1700)   PRN Meds:.sodium chloride Medications Prior to Admission:  Prior to Admission medications   Medication Sig Start Date End Date Taking? Authorizing Provider  albuterol (PROVENTIL) (2.5 MG/3ML) 0.083% nebulizer solution Take 3 mLs (2.5 mg total) by nebulization every 2 (two) hours as needed for wheezing or shortness of breath. Patient not taking: Reported on 02/26/2016 11/24/15   Geradine Girt, DO  aspirin EC 81 MG tablet Take 1 tablet (81 mg total) by mouth daily. 07/30/14   Thayer Headings, MD  atenolol (TENORMIN) 50 MG tablet TAKE 1 TABLET BY  MOUTH DAILY 06/05/15   Thayer Headings, MD  azithromycin (ZITHROMAX) 500 MG tablet Take 1 tablet (500 mg total) by mouth daily. Through 2/9, then stop Patient not taking: Reported on 02/26/2016 12/29/15   Delfina Redwood, MD  bacitracin ointment Apply 1 application topically daily. 03/28/16   Lajean Saver, MD  bisacodyl (DULCOLAX) 10 MG suppository Place 1 suppository (10 mg total) rectally daily as needed for moderate constipation. Patient not taking: Reported on 02/26/2016 11/24/15   Geradine Girt, DO  chlorpheniramine-HYDROcodone (TUSSIONEX) 10-8 MG/5ML SUER Take 5 mLs by mouth every 12 (twelve) hours as needed for  cough. 12/29/15   Delfina Redwood, MD  clopidogrel (PLAVIX) 75 MG tablet Take 1 tablet (75 mg total) by mouth daily. 11/24/15   Geradine Girt, DO  esomeprazole (NEXIUM) 40 MG capsule Take 40 mg by mouth daily before breakfast.      Historical Provider, MD  furosemide (LASIX) 40 MG tablet Take 1 tablet (40 mg total) by mouth daily. Patient taking differently: Take 80 mg by mouth as directed.  12/29/15   Delfina Redwood, MD  gabapentin (NEURONTIN) 300 MG capsule Take 300 mg by mouth 2 (two) times daily.     Historical Provider, MD  insulin aspart (NOVOLOG) 100 UNIT/ML injection Inject 0-5 Units into the skin at bedtime. 11/24/15   Geradine Girt, DO  insulin aspart (NOVOLOG) 100 UNIT/ML injection Inject 0-9 Units into the skin 3 (three) times daily with meals. 11/24/15   Geradine Girt, DO  insulin glargine (LANTUS) 100 UNIT/ML injection Inject 0.2 mLs (20 Units total) into the skin at bedtime. 12/29/15   Delfina Redwood, MD  isosorbide mononitrate (IMDUR) 30 MG 24 hr tablet TAKE 1 TABLET (30 MG TOTAL) BY MOUTH DAILY. 12/03/15   Thayer Headings, MD  lisinopril (PRINIVIL,ZESTRIL) 20 MG tablet Take 20 mg by mouth daily.    Historical Provider, MD  LORazepam (ATIVAN) 0.5 MG tablet Take 0.5 tablets (0.25 mg total) by mouth every 4 (four) hours as needed for anxiety or sleep. Patient not taking:  Reported on 02/26/2016 12/29/15   Delfina Redwood, MD  nystatin (MYCOSTATIN) 100000 UNIT/ML suspension Take 5 mLs (500,000 Units total) by mouth 4 (four) times daily. Patient not taking: Reported on 02/26/2016 11/24/15   Geradine Girt, DO  ONE TOUCH ULTRA TEST test strip  02/22/16   Historical Provider, MD  polyethylene glycol (MIRALAX / GLYCOLAX) packet Take 17 g by mouth daily. Patient taking differently: Take 17 g by mouth daily as needed for mild constipation.  11/24/15   Geradine Girt, DO  RANEXA 1000 MG SR tablet TAKE 1 TABLET BY MOUTH TWICE A DAY 08/12/15   Thayer Headings, MD  simvastatin (ZOCOR) 20 MG tablet TAKE 1 TABLET BY MOUTH AT BEDTIME 07/29/15   Thayer Headings, MD  traMADol (ULTRAM) 50 MG tablet Take 1 tablet (50 mg total) by mouth every 6 (six) hours as needed for moderate pain. Patient not taking: Reported on 02/26/2016 11/24/15   Geradine Girt, DO  traMADol (ULTRAM) 50 MG tablet Take 1 tablet (50 mg total) by mouth every 6 (six) hours as needed. 03/28/16   Lajean Saver, MD  zolpidem (AMBIEN) 5 MG tablet Take 1 tablet (5 mg total) by mouth at bedtime as needed for sleep. Patient not taking: Reported on 02/26/2016 12/29/15   Delfina Redwood, MD   Allergies  Allergen Reactions  . Morphine And Related     Altered mental status  . Tetracyclines & Related     "Raw throat"   Review of Systems  Unable to perform ROS: Intubated    Physical Exam  Constitutional: He appears well-developed and well-nourished.  Cardiovascular:  Has been bradycardic  Pulmonary/Chest:  ventilated  Genitourinary:  foley  Neurological:  opening eyes reflexively; does not appear purposeful  Skin: Skin is warm and dry.  Nursing note and vitals reviewed.   Vital Signs: BP 134/50 mmHg  Pulse 64  Temp(Src) 101.7 F (38.7 C) (Core (Comment))  Resp 22  Wt 98.1 kg (216 lb 4.3 oz)  SpO2 100% Pain Assessment:  CPOT       SpO2: SpO2: 100 % O2 Device:SpO2: 100 % O2 Flow Rate: .   IO: Intake/output  summary:  Intake/Output Summary (Last 24 hours) at 04/20/2016 1803 Last data filed at 03/23/2016 1700  Gross per 24 hour  Intake 5199.31 ml  Output     50 ml  Net 5149.31 ml    LBM:   Baseline Weight: Weight: 98.1 kg (216 lb 4.3 oz) Most recent weight: Weight: 98.1 kg (216 lb 4.3 oz)     Palliative Assessment/Data:   Flowsheet Rows        Most Recent Value   Intake Tab    Referral Department  Critical care   Unit at Time of Referral  ICU   Palliative Care Primary Diagnosis  Cardiac   Date Notified  03/31/2016   Palliative Care Type  Return patient Palliative Care   Date first seen by Palliative Care  03/28/2016   # of days Palliative referral response time  0 Day(s)   Clinical Assessment    Palliative Performance Scale Score  20%   Pain Max last 24 hours  Not able to report   Pain Min Last 24 hours  Not able to report   Dyspnea Max Last 24 Hours  Not able to report   Dyspnea Min Last 24 hours  Not able to report   Nausea Max Last 24 Hours  Not able to report   Nausea Min Last 24 Hours  Not able to report   Anxiety Max Last 24 Hours  Not able to report   Anxiety Min Last 24 Hours  Not able to report   Other Max Last 24 Hours  Not able to report   Psychosocial & Spiritual Assessment    Palliative Care Outcomes    Patient/Family meeting held?  Yes   Who was at the meeting?  wife   Palliative Care follow-up planned  Yes, Facility      Time In: 1700 Time Out: 1815 Time Total: 75 min Greater than 50%  of this time was spent counseling and coordinating care related to the above assessment and plan.  Signed by: Dory Horn, NP   Please contact Palliative Medicine Team phone at [phone] for questions and concerns.  For individual provider: See Shea Evans

## 2016-04-03 NOTE — H&P (Addendum)
PULMONARY / CRITICAL CARE MEDICINE   Name: [name] MRN: [id number] DOB: [birth year]    ADMISSION DATE:  Apr 17, 2016 CONSULTATION DATE:  04-17-16  REFERRING MD:  ED  CHIEF COMPLAINT:  Asystolic cardiac arrest  HISTORY OF PRESENT ILLNESS:   82 Y/O with PMH of CAD - s/p CABG, Peripheral neuropathy, HTN, Hyperlipidemia, Diabetes Mellitus. He has MI, severe diffuse coronary disease by cath in Dec. 2016. No PCI was done and medical therapy was advised. He's had several episodes of pneumonia over the past few months. He was admitted with heart failure, HCAP in Feb 2017 and discharged to rehab but remained deconditioned with frequent falls. Palliative care was consulted and hospice discussed at last admission. He was DNR/DNI but on discharge his wife changed the code status to DNI but ok for CPR and defib  Today he is admitted with a witnessed cardiac arrest at home. He collapsed when he got up to use the bathroom at 3 AM. No CPR administered for 15 minutes. He was found to be in asystole by EMS. Given 4 doses epi but lost pulses again. Arrived asystolic in ED. He was given more epi. King airway found to be displaced in the mouth and patient was hypoxemic. After intubation he got ROSC. He was hypotensive after intubation and was started on peripheral Levophed.  PAST MEDICAL HISTORY :  He  has a past medical history of Hypertension; Diabetes mellitus; Dyslipidemia; Coronary artery disease; Heart murmur; Renal calculi; Peripheral autonomic neuropathy due to diabetes mellitus (H[facility]); Cataract; Myocardial infarction [facility]); NSTEMI (non-ST elevated myocardial infarction) ([facility]) (11/12/2015); Chronic respiratory failure ([facility]); HCAP (healthcare-associated pneumonia); and Shortness of breath dyspnea.  PAST SURGICAL HISTORY: He  has past surgical history that includes Lithotripsy; US ECHOCARDIOGRAPHY (01-16-09); Cardiovascular stress test (09-26-03); Cardiac catheterization; Coronary artery bypass graft;  Coronary angioplasty with stent; Tonsillectomy; Cataract extraction w/ intraocular lens implant; Back surgery; Total hip arthroplasty (10/29/2011); Joint replacement; Eye surgery; Spine surgery; and Cardiac catheterization (N/A, 11/13/2015).  Allergies  Allergen Reactions  . Morphine And Related     Altered mental status  . Tetracyclines & Related     "Raw throat"    No current facility-administered medications on file prior to encounter.   Current Outpatient Prescriptions on File Prior to Encounter  Medication Sig  . albuterol (PROVENTIL) (2.5 MG/3ML) 0.083% nebulizer solution Take 3 mLs (2.5 mg total) by nebulization every 2 (two) hours as needed for wheezing or shortness of breath. (Patient not taking: Reported on 02/26/2016)  . aspirin EC 81 MG tablet Take 1 tablet (81 mg total) by mouth daily.  Marland Kitchen atenolol (TENORMIN) 50 MG tablet TAKE 1 TABLET BY MOUTH DAILY  . azithromycin (ZITHROMAX) 500 MG tablet Take 1 tablet (500 mg total) by mouth daily. Through 2/9, then stop (Patient not taking: Reported on 02/26/2016)  . bacitracin ointment Apply 1 application topically daily.  . bisacodyl (DULCOLAX) 10 MG suppository Place 1 suppository (10 mg total) rectally daily as needed for moderate constipation. (Patient not taking: Reported on 02/26/2016)  . chlorpheniramine-HYDROcodone (TUSSIONEX) 10-8 MG/5ML SUER Take 5 mLs by mouth every 12 (twelve) hours as needed for cough.  . clopidogrel (PLAVIX) 75 MG tablet Take 1 tablet (75 mg total) by mouth daily.  Marland Kitchen esomeprazole (NEXIUM) 40 MG capsule Take 40 mg by mouth daily before breakfast.    . furosemide (LASIX) 40 MG tablet Take 1 tablet (40 mg total) by mouth daily. (Patient taking differently: Take 80 mg by mouth as directed. )  . gabapentin (  NEURONTIN) 300 MG capsule Take 300 mg by mouth 2 (two) times daily.   . insulin aspart (NOVOLOG) 100 UNIT/ML injection Inject 0-5 Units into the skin at bedtime.  . insulin aspart (NOVOLOG) 100 UNIT/ML injection  Inject 0-9 Units into the skin 3 (three) times daily with meals.  . insulin glargine (LANTUS) 100 UNIT/ML injection Inject 0.2 mLs (20 Units total) into the skin at bedtime.  . isosorbide mononitrate (IMDUR) 30 MG 24 hr tablet TAKE 1 TABLET (30 MG TOTAL) BY MOUTH DAILY.  Marland Kitchen. lisinopril (PRINIVIL,ZESTRIL) 20 MG tablet Take 20 mg by mouth daily.  Marland Kitchen. LORazepam (ATIVAN) 0.5 MG tablet Take 0.5 tablets (0.25 mg total) by mouth every 4 (four) hours as needed for anxiety or sleep. (Patient not taking: Reported on 02/26/2016)  . nystatin (MYCOSTATIN) 100000 UNIT/ML suspension Take 5 mLs (500,000 Units total) by mouth 4 (four) times daily. (Patient not taking: Reported on 02/26/2016)  . ONE TOUCH ULTRA TEST test strip   . polyethylene glycol (MIRALAX / GLYCOLAX) packet Take 17 g by mouth daily. (Patient taking differently: Take 17 g by mouth daily as needed for mild constipation. )  . RANEXA 1000 MG SR tablet TAKE 1 TABLET BY MOUTH TWICE A DAY  . simvastatin (ZOCOR) 20 MG tablet TAKE 1 TABLET BY MOUTH AT BEDTIME  . traMADol (ULTRAM) 50 MG tablet Take 1 tablet (50 mg total) by mouth every 6 (six) hours as needed for moderate pain. (Patient not taking: Reported on 02/26/2016)  . traMADol (ULTRAM) 50 MG tablet Take 1 tablet (50 mg total) by mouth every 6 (six) hours as needed.  . zolpidem (AMBIEN) 5 MG tablet Take 1 tablet (5 mg total) by mouth at bedtime as needed for sleep. (Patient not taking: Reported on 02/26/2016)  . [DISCONTINUED] ferrous sulfate 325 (65 FE) MG tablet Take 1 tablet (325 mg total) by mouth 3 (three) times daily after meals.    FAMILY HISTORY:  His indicated that his mother is deceased. He indicated that his father is deceased. He indicated that his sister is alive. He indicated that his maternal grandmother is deceased. He indicated that his maternal grandfather is deceased. He indicated that his paternal grandmother is deceased. He indicated that his paternal grandfather is deceased.   SOCIAL  HISTORY: He  reports that he has quit smoking. His smoking use included Pipe. He has never used smokeless tobacco. He reports that he does not drink alcohol or use illicit drugs.  REVIEW OF SYSTEMS:   Unable to obtain because of unresponsive status  SUBJECTIVE:   VITAL SIGNS: BP 108/52 mmHg  Pulse 67  Temp(Src) 96.9 F (36.1 C) (Rectal)  Resp 20  SpO2 95%  HEMODYNAMICS:    VENTILATOR SETTINGS: Vent Mode:  [-] PRVC FiO2 (%):  [100 %] 100 % Set Rate:  [20 bmp] 20 bmp Vt Set:  [600 mL] 600 mL PEEP:  [5 cmH20] 5 cmH20 Plateau Pressure:  [23 cmH20] 23 cmH20  INTAKE / OUTPUT:    PHYSICAL EXAMINATION: General: Comatose, unresponsive Neuro:  Pupils fixed, dilated. No corneal, gag reflex. HEENT:  Moist mucous membranes, no thyromegaly, JVD Cardiovascular: Regular rate and rhythm, no MRG Lungs:  Clear, no wheeze, crackles Abdomen:  Soft, positive bowel sounds Musculoskeletal: No edema Skin: Bruises over the chest, abrasions to bilateral knees, burn injury over left thigh from coffee spill  LABS:  BMET  Recent Labs Lab 09/20/16 0429  NA 142  K 4.2  CL 103  BUN 70*  CREATININE 1.90*  GLUCOSE 235*    Electrolytes No results for input(s): CALCIUM, MG, PHOS in the last 168 hours.  CBC  Recent Labs Lab 04/15/2016 0423 04/12/2016 0429  WBC 17.2*  --   HGB 9.8* 11.6*  HCT 32.3* 34.0*  PLT 206  --     Coag's No results for input(s): APTT, INR in the last 168 hours.  Sepsis Markers  Recent Labs Lab 04/20/2016 0438  LATICACIDVEN 9.33*    ABG  Recent Labs Lab 03/27/2016 0446  PHART 7.138*  PCO2ART 67.6*  PO2ART 534.0*    Liver Enzymes No results for input(s): AST, ALT, ALKPHOS, BILITOT, ALBUMIN in the last 168 hours.  Cardiac Enzymes No results for input(s): TROPONINI, PROBNP in the last 168 hours.  Glucose No results for input(s): GLUCAP in the last 168 hours.  Imaging No results found.   STUDIES:  CXR 5/13 > bibasilar opacity, images  reviewed. CT head 5/13 >  CULTURES: Bcx 5/13 > Ucx 5/13 > Sp CX 5/13  ANTIBIOTICS: Vanco 5/13 > Zosyn 5/13 >  SIGNIFICANT EVENTS: 5/13. Admitted with asystolic respiratory arrest.  LINES/TUBES: ETT 5/13 >  DISCUSSION: 82 year old with multiple comorbidities, declining functional status, recurrent pneumonias. Admitted after an asystolic arrest with prolonged downtime. He went for 15 minutes without CPR at home. He has signs of anoxic brain injury and he is not a hypothermia candidate Cardiac arrest is likely from respiratory etiology. R/O MI  ASSESSMENT / PLAN:  PULMONARY A: Resp failure HCAP P:   Full vent support Check ABG.  CARDIOVASCULAR A:  Asystolic arrest. Not a hypothermia candidate due to anoxic injury and prolonged downtime H/O CAD s/p CABG Elevated troponins P:  Cardiology consulted by ED Follow LA, EKG, troponins Echo Levaphed can be weaned off as BP is improving. Will try to avoid CVC.  RENAL A:   AKI P:   Monitor urine output and Cr after hydration.  GASTROINTESTINAL A:   Stable P:   Keep NPO  HEMATOLOGIC A:   Leukocytosis from sepsis P:  Monitior  INFECTIOUS A:   HCAP Sepsis P:   Continue vanco, zosyn Follow cultures and procalitonin  ENDOCRINE A:   DM P:   SSI coverage  NEUROLOGIC A:   Concern for severe anoxic injury P:   Observe off sedation. PRN fentanyl, Versed for comfort. Head CT.  FAMILY  - Updates: Wife updated at bedside. See below. - Inter-disciplinary family meet or Palliative Care meeting due by: Day 7  Interdisciplinary Goals of Care Family Meeting    Date carried out:: 04/07/2016  Location of the meeting: Bedside  Member's involved: Physician, Bedside Registered Nurse and Chaplain  Durable Power of Attorney or acting medical decision maker: Wife was present.    Discussion: We discussed goals of care for [employer] .  He had expressed his wish not to be support if he is in a vegetative  state. He is previously limited code but reversed in February. Palliative care had been involved at the time. I expressed my concern to the wife that he has suffered severe anoxic injury. She agrees that he would not want to be supported like this but is not ready to withdraw care yet. She will try to get her daughter who is handicapped to come to the hospital. We have made him DNR. Get palliative care involved again for ongoing goals of care discussion as they know him and his wife well.  Code status: Limited Code or DNR with short term  Disposition: Continue current  acute care  Time spent for the meeting: 15  Total Critical care time -40 mins  Chilton Greathouse MD [facility] Pager [phone] If no answer or after 3pm call: [phone] 03/30/2016, 5:47 AM   Addendum  CT head reviewed- No acute abnormality ABG- 7.14/68/534/100%  Will reduce FiO2 and increase RR from 20 to 28.  Dajai Wahlert 7:00 am

## 2016-04-03 NOTE — ED Notes (Signed)
Dr. Rosaland LaoPranam at bedside speaking with spouse on plan of care and pt.'s prognosis.

## 2016-04-03 NOTE — ED Notes (Signed)
Attempted to call report

## 2016-04-04 DIAGNOSIS — G931 Anoxic brain damage, not elsewhere classified: Secondary | ICD-10-CM

## 2016-04-04 DIAGNOSIS — I469 Cardiac arrest, cause unspecified: Secondary | ICD-10-CM

## 2016-04-04 DIAGNOSIS — Z515 Encounter for palliative care: Secondary | ICD-10-CM

## 2016-04-04 LAB — URINE CULTURE: Culture: NO GROWTH

## 2016-04-04 LAB — BASIC METABOLIC PANEL
ANION GAP: 15 (ref 5–15)
BUN: 78 mg/dL — ABNORMAL HIGH (ref 6–20)
CHLORIDE: 104 mmol/L (ref 101–111)
CO2: 23 mmol/L (ref 22–32)
Calcium: 8.4 mg/dL — ABNORMAL LOW (ref 8.9–10.3)
Creatinine, Ser: 3.34 mg/dL — ABNORMAL HIGH (ref 0.61–1.24)
GFR calc non Af Amer: 15 mL/min — ABNORMAL LOW (ref >60)
GFR, EST AFRICAN AMERICAN: 18 mL/min — AB (ref >60)
Glucose, Bld: 165 mg/dL — ABNORMAL HIGH (ref 65–99)
POTASSIUM: 4.9 mmol/L (ref 3.5–5.1)
Sodium: 142 mmol/L (ref 135–145)

## 2016-04-04 LAB — GLUCOSE, CAPILLARY
GLUCOSE-CAPILLARY: 111 mg/dL — AB (ref 65–99)
GLUCOSE-CAPILLARY: 95 mg/dL (ref 65–99)
GLUCOSE-CAPILLARY: 81 mg/dL (ref 65–99)
GLUCOSE-CAPILLARY: 136 mg/dL — AB (ref 65–99)
Glucose-Capillary: 184 mg/dL — ABNORMAL HIGH (ref 65–99)
Glucose-Capillary: 89 mg/dL (ref 65–99)

## 2016-04-04 MED ORDER — PIPERACILLIN-TAZOBACTAM IN DEX 2-0.25 GM/50ML IV SOLN
2.2500 g | Freq: Four times a day (QID) | INTRAVENOUS | Status: DC
  Administered 2016-04-04 – 2016-04-06 (×8): 2.25 g via INTRAVENOUS
  Filled 2016-04-04 (×10): qty 50

## 2016-04-04 MED ORDER — DEXTROSE-NACL 5-0.9 % IV SOLN
INTRAVENOUS | Status: DC
  Administered 2016-04-04 – 2016-04-06 (×2): via INTRAVENOUS
  Administered 2016-04-07: 50 mL/h via INTRAVENOUS

## 2016-04-04 MED ORDER — HYDRALAZINE HCL 20 MG/ML IJ SOLN
10.0000 mg | INTRAMUSCULAR | Status: DC | PRN
  Administered 2016-04-04 – 2016-04-07 (×6): 20 mg via INTRAVENOUS
  Filled 2016-04-04 (×6): qty 1

## 2016-04-04 NOTE — Progress Notes (Addendum)
Chaplain responded to request for family support.  Mrs. [name] indicated that they are very active in their church, [employer]. They appreciated Chaplain Adam's visit yesterday and patient's wife Dylan Sheffield(Gladys) said their pastor has also visited three times.  Wife was teary, but expressed faith in God and appreciation for patient care.  They accepted prayer.  Offered hospitality and support.  Will monitor for f/u needs. Please call as needed.   Theodoro Parmaalacios, Demitrios Molyneux N, Chaplain [phone]    Returned in the evening to follow up on patient.  Wife was not present.  Offered a psalm and prayer to patient. Patient still presents as weak, semi-conscious, with eyes half-open. Did not blink or respond to vocal stimulation. Left a card for family.     04/04/16 1200  Clinical Encounter Type  Visited With Patient and family together  Visit Type Initial;Spiritual support  Referral From Nurse  Consult/Referral To Chaplain  Spiritual Encounters  Spiritual Needs Prayer  Stress Factors  Patient Stress Factors Health changes  Family Stress Factors Loss of control

## 2016-04-04 NOTE — Progress Notes (Addendum)
Pharmacy Antibiotic Note  Dylan BennettRichard J Vlachos is a 82 y.o. male admitted on 06-01-2016 s/p cardiac arrest.  Pharmacy has been consulted for managing vancomycin and Zosyn for HCAP.  Patient's renal function is worsening.  Plan: - Hold vanc, check random level in AM - Change Zosyn to 2.25gm IV Q6H - Monitor renal fxn, clinical progress, micro data, troponin, goal of care   Weight: 217 lb 9.5 oz (98.7 kg)  Temp (24hrs), Avg:99.8 F (37.7 C), Min:94.3 F (34.6 C), Max:102.6 F (39.2 C)   Recent Labs Lab 09/14/2016 0423 09/14/2016 0429 09/14/2016 0438 09/14/2016 0649 09/14/2016 0810 04/04/16 0212  WBC 17.2*  --   --  15.6*  --   --   CREATININE 2.13* 1.90*  --  2.01*  --  3.34*  LATICACIDVEN  --   --  9.33* 5.7* 4.30*  --     Estimated Creatinine Clearance: 18.7 mL/min (by C-G formula based on Cr of 3.34).    Allergies  Allergen Reactions  . Morphine And Related     Altered mental status  . Tetracyclines & Related     "Raw throat"    Antimicrobials this admission: Vanc 5/13 >> Zosyn 5/13 >>  Dose adjustments this admission: N/A  Microbiology results: 5/13 UCx - 5/13 BCx x2 - 5/13 MRSA PCR - negative   Venita Seng D. Laney Potashang, PharmD, BCPS Pager:  [phone] 04/04/2016, 8:50 AM
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
## 2016-04-05 ENCOUNTER — Inpatient Hospital Stay (HOSPITAL_COMMUNITY): Payer: Medicare Other

## 2016-04-05 DIAGNOSIS — G931 Anoxic brain damage, not elsewhere classified: Secondary | ICD-10-CM

## 2016-04-05 DIAGNOSIS — J9621 Acute and chronic respiratory failure with hypoxia: Secondary | ICD-10-CM

## 2016-04-05 DIAGNOSIS — I5021 Acute systolic (congestive) heart failure: Secondary | ICD-10-CM

## 2016-04-05 DIAGNOSIS — J189 Pneumonia, unspecified organism: Secondary | ICD-10-CM

## 2016-04-05 DIAGNOSIS — N179 Acute kidney failure, unspecified: Secondary | ICD-10-CM

## 2016-04-05 LAB — GLUCOSE, CAPILLARY
GLUCOSE-CAPILLARY: 181 mg/dL — AB (ref 65–99)
GLUCOSE-CAPILLARY: 189 mg/dL — AB (ref 65–99)
GLUCOSE-CAPILLARY: 207 mg/dL — AB (ref 65–99)
GLUCOSE-CAPILLARY: 233 mg/dL — AB (ref 65–99)
Glucose-Capillary: 232 mg/dL — ABNORMAL HIGH (ref 65–99)
Glucose-Capillary: 213 mg/dL — ABNORMAL HIGH (ref 65–99)

## 2016-04-05 LAB — BASIC METABOLIC PANEL
Anion gap: 16 — ABNORMAL HIGH (ref 5–15)
BUN: 94 mg/dL — AB (ref 6–20)
CHLORIDE: 105 mmol/L (ref 101–111)
CO2: 21 mmol/L — ABNORMAL LOW (ref 22–32)
Calcium: 8.3 mg/dL — ABNORMAL LOW (ref 8.9–10.3)
Creatinine, Ser: 4.54 mg/dL — ABNORMAL HIGH (ref 0.61–1.24)
GFR calc Af Amer: 12 mL/min — ABNORMAL LOW (ref >60)
GFR calc non Af Amer: 11 mL/min — ABNORMAL LOW (ref >60)
GLUCOSE: 173 mg/dL — AB (ref 65–99)
POTASSIUM: 5.1 mmol/L (ref 3.5–5.1)
Sodium: 142 mmol/L (ref 135–145)

## 2016-04-05 LAB — CBC
HCT: 29.2 % — ABNORMAL LOW (ref 39.0–52.0)
HEMOGLOBIN: 9.8 g/dL — AB (ref 13.0–17.0)
MCH: 32.7 pg (ref 26.0–34.0)
MCHC: 33.6 g/dL (ref 30.0–36.0)
MCV: 97.3 fL (ref 78.0–100.0)
Platelets: 176 10*3/uL (ref 150–400)
RBC: 3 MIL/uL — AB (ref 4.22–5.81)
RDW: 15.3 % (ref 11.5–15.5)
WBC: 20.2 10*3/uL — ABNORMAL HIGH (ref 4.0–10.5)

## 2016-04-05 LAB — LEGIONELLA PNEUMOPHILA SEROGP 1 UR AG: L. PNEUMOPHILA SEROGP 1 UR AG: NEGATIVE

## 2016-04-05 MED ORDER — ARTIFICIAL TEARS OP OINT
TOPICAL_OINTMENT | OPHTHALMIC | Status: DC | PRN
  Filled 2016-04-05: qty 3.5

## 2016-04-05 MED ORDER — SILVER SULFADIAZINE 1 % EX CREA
TOPICAL_CREAM | Freq: Every day | CUTANEOUS | Status: DC
  Administered 2016-04-06: 10:00:00 via TOPICAL
  Filled 2016-04-05: qty 85

## 2016-04-05 NOTE — Progress Notes (Signed)
Palliative Medicine RN Note: Saw pt with wife and daughter at bedside. He did not react at all to any stimuli during my visit. Wife states, "I hope you can get him to do something for you." When introduced, wife [name] immediately tensed up and held her breath. Explained that I am just checking on Mr [name] and her daily and that I will help answer questions; she relaxed and agreed to let me come back tomorrow.  Donn PieriniMelanie G. Oliver, RN, BSN, [facility] 04/05/2016 1:41 PM Cell [phone] 8:00-4:00 Monday-Friday Office [phone]

## 2016-04-05 NOTE — Progress Notes (Signed)
MEDICATION RELATED NOTE  Pharmacy Re:  Home Medications  Assessment: 82yo came to the hospital in asystolic cardiac arrest.  The following meds are on his past history but have not been confirmed.  I am listing those prescriptions from the outside records that has been filled since January 2017.   A.  Amlodipine  B.  Atenolol C.  Clopidogrel D.  Esomeprazole E.  Fenofibrate F.  Furosemide G.  Gabapentin H.  Lispro Insulin I.   Isosorbide Mononitrate J.  Lisinopril K.  Metolazone L.  Mometasone Furoate M.  Ranolazine N.  Simvastatin O.  Zolpidem  The above medications cannot be confirmed with the patient as to their active/inactive status.  Plan:  Please confirm those you feel appropriate for him to continue.   I have marked his record complete.  Medications:  Prescriptions prior to admission  Medication Sig Dispense Refill Last Dose  . albuterol (PROVENTIL) (2.5 MG/3ML) 0.083% nebulizer solution Take 3 mLs (2.5 mg total) by nebulization every 2 (two) hours as needed for wheezing or shortness of breath. (Patient not taking: Reported on 02/26/2016) 75 mL 12 Not Taking  . aspirin EC 81 MG tablet Take 1 tablet (81 mg total) by mouth daily.   Taking  . atenolol (TENORMIN) 50 MG tablet TAKE 1 TABLET BY MOUTH DAILY 90 tablet 3 Taking  . azithromycin (ZITHROMAX) 500 MG tablet Take 1 tablet (500 mg total) by mouth daily. Through 2/9, then stop (Patient not taking: Reported on 02/26/2016)  0 Not Taking  . bacitracin ointment Apply 1 application topically daily. 28 g 0   . bisacodyl (DULCOLAX) 10 MG suppository Place 1 suppository (10 mg total) rectally daily as needed for moderate constipation. (Patient not taking: Reported on 02/26/2016) 12 suppository 0 Not Taking  . chlorpheniramine-HYDROcodone (TUSSIONEX) 10-8 MG/5ML SUER Take 5 mLs by mouth every 12 (twelve) hours as needed for cough. 115 mL 0 Taking  . clopidogrel (PLAVIX) 75 MG tablet Take 1 tablet (75 mg total) by mouth daily.   Taking   . esomeprazole (NEXIUM) 40 MG capsule Take 40 mg by mouth daily before breakfast.     Taking  . furosemide (LASIX) 40 MG tablet Take 1 tablet (40 mg total) by mouth daily. (Patient taking differently: Take 80 mg by mouth as directed. )   Taking  . gabapentin (NEURONTIN) 300 MG capsule Take 300 mg by mouth 2 (two) times daily.    Taking  . insulin aspart (NOVOLOG) 100 UNIT/ML injection Inject 0-5 Units into the skin at bedtime. 10 mL 11 Taking  . insulin aspart (NOVOLOG) 100 UNIT/ML injection Inject 0-9 Units into the skin 3 (three) times daily with meals. 10 mL 11 Taking  . insulin glargine (LANTUS) 100 UNIT/ML injection Inject 0.2 mLs (20 Units total) into the skin at bedtime. 10 mL 11 Taking  . isosorbide mononitrate (IMDUR) 30 MG 24 hr tablet TAKE 1 TABLET (30 MG TOTAL) BY MOUTH DAILY. 90 tablet 3 Taking  . lisinopril (PRINIVIL,ZESTRIL) 20 MG tablet Take 20 mg by mouth daily.     Marland Kitchen. LORazepam (ATIVAN) 0.5 MG tablet Take 0.5 tablets (0.25 mg total) by mouth every 4 (four) hours as needed for anxiety or sleep. (Patient not taking: Reported on 02/26/2016) 10 tablet 0 Not Taking  . nystatin (MYCOSTATIN) 100000 UNIT/ML suspension Take 5 mLs (500,000 Units total) by mouth 4 (four) times daily. (Patient not taking: Reported on 02/26/2016) 60 mL 0 Not Taking  . ONE TOUCH ULTRA TEST test strip      .  polyethylene glycol (MIRALAX / GLYCOLAX) packet Take 17 g by mouth daily. (Patient taking differently: Take 17 g by mouth daily as needed for mild constipation. ) 14 each 0 Taking  . RANEXA 1000 MG SR tablet TAKE 1 TABLET BY MOUTH TWICE A DAY 60 tablet 11 Taking  . simvastatin (ZOCOR) 20 MG tablet TAKE 1 TABLET BY MOUTH AT BEDTIME 30 tablet 6 Taking  . traMADol (ULTRAM) 50 MG tablet Take 1 tablet (50 mg total) by mouth every 6 (six) hours as needed for moderate pain. (Patient not taking: Reported on 02/26/2016) 15 tablet 0 Not Taking  . traMADol (ULTRAM) 50 MG tablet Take 1 tablet (50 mg total) by mouth every 6  (six) hours as needed. 20 tablet 0   . zolpidem (AMBIEN) 5 MG tablet Take 1 tablet (5 mg total) by mouth at bedtime as needed for sleep. (Patient not taking: Reported on 02/26/2016) 10 tablet 0 Not Taking   Nadara Mustard, PharmD., MS Clinical Pharmacist Pager:  [phone] Thank you for allowing pharmacy to be part of this patients care team. 04/05/2016,12:05 PM
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
## 2016-04-05 NOTE — Care Management Note (Addendum)
Case Management Note  Patient Details  Name: Dylan BennettRichard J Barron MRN: [id number] Date of Birth: [birth year]  Subjective/Objective:  Pt is Barron/p cardiac arrest                  Action/Plan:  PTA independent from home with wife.  Pt on home O2.  CM will continue to monitor for disposition needs   Expected Discharge Date:                  Expected Discharge Plan:  Home w Home Health Services  In-House Referral:     Discharge planning Services  CM Consult  Post Acute Care Choice:    Choice offered to:     DME Arranged:    DME Agency:     HH Arranged:    HH Agency:     Status of Service:  In process, will continue to follow  Medicare Important Message Given:    Date Medicare IM Given:    Medicare IM give by:    Date Additional Medicare IM Given:    Additional Medicare Important Message give by:     If discussed at Long Length of Stay Meetings, dates discussed:    Additional Comments:  [name], Dylan Ferrentino S, RN 04/05/2016, 4:14 PM

## 2016-04-05 NOTE — Progress Notes (Signed)
PCCM PROGRESS NOTE  Jeralyn BennettRichard J Oshiro is a 82 y.o. male admitted on 03/26/2016 with asystolic cardiac arrest.  He had evidence for severe anoxic brain injury on presentation.  He was not a candidate for target temperature management and cardiology assessment was deferred.  He was started on ABx for HCAP.  He had EEG which showed profound cerebral dysfunction consistent with anoxic injury.  Palliative care was consulted.  Vital signs: BP 142/47 mmHg  Pulse 57  Temp(Src) 100.2 F (37.9 C) (Core (Comment))  Resp 22  Wt 218 lb 4.1 oz (99 kg)  SpO2 100%  General: unresponsive Neuro: opens eyes HEENT: ETT in place Cardiac: regular Chest: b/l rhonchi Abd: soft, non tender Ext: 1+ edema Skin: no rashes  CMP Latest Ref Rng 04/05/2016 04/04/2016 04/10/2016  Glucose 65 - 99 mg/dL 478(G173(H) 956(O165(H) 130(Q192(H)  BUN 6 - 20 mg/dL 65(H94(H) 84(O78(H) 96(E64(H)  Creatinine 0.61 - 1.24 mg/dL 9.52(W4.54(H) 4.13(K3.34(H) 4.40(N2.01(H)  Sodium 135 - 145 mmol/L 142 142 143  Potassium 3.5 - 5.1 mmol/L 5.1 4.9 4.8  Chloride 101 - 111 mmol/L 105 104 103  CO2 22 - 32 mmol/L 21(L) 23 23  Calcium 8.9 - 10.3 mg/dL 8.3(L) 8.4(L) 9.0  Total Protein 6.5 - 8.1 g/dL - - 6.0(L)  Total Bilirubin 0.3 - 1.2 mg/dL - - 0.7  Alkaline Phos 38 - 126 U/L - - 54  AST 15 - 41 U/L - - 153(H)  ALT 17 - 63 U/L - - 125(H)     CBC Latest Ref Rng 04/05/2016 04/17/2016 04/12/2016  WBC 4.0 - 10.5 K/uL 20.2(H) 15.6(H) -  Hemoglobin 13.0 - 17.0 g/dL 0.2(V9.8(L) 2.5(D9.7(L) 11.6(L)  Hematocrit 39.0 - 52.0 % 29.2(L) 31.7(L) 34.0(L)  Platelets 150 - 400 K/uL 176 173 -    Dg Chest Port 1 View  04/05/2016  CLINICAL DATA:  Respiratory failure. EXAM: PORTABLE CHEST 1 VIEW COMPARISON:  03/23/2016. FINDINGS: Endotracheal tube and NG tube in stable position. Prior CABG. Cardiomegaly with pulmonary vascular prominence. Mild bibasilar atelectasis and or infiltrates noted again on today's exam. Left costophrenic angle not imaged. No pneumothorax. IMPRESSION: 1. Lines and tubes stable  position. 2. Prior CABG.  Cardiomegaly with mild pulmonary venous congestion. 3. Mild atelectasis and/or infiltrates in the lung bases again noted on today's exam. No interim change. Electronically Signed   By: Maisie Fushomas  Register   On: 04/05/2016 06:57    Assessment: Cardiac arrest Acute on chronic hypoxic respiratory failure Anoxic encephalopathy Myoclonus  Sepsis from HCAP AKI Acute systolic CHF NSTEMI Hx of CAD s/p CABG Hx of HTN Hx of DM Hx of recurrent pneumonia   Plan: Full vent support Continue Abx for now Defer further lab testing or imaging studies Limited resuscitation >> no CPR, no defibrillation Not candidate for HD F/u with palliative care  Updated pt's wife at bedside.  She understands what we are dealing with, but not ready to make transition to comfort care yet.    CC time 32 minutes.  Coralyn HellingVineet Nathon Stefanski, MD [facility] 04/05/2016, 12:41 PM Pager:  [phone] After 3pm call: [phone]

## 2016-04-06 LAB — GLUCOSE, CAPILLARY
GLUCOSE-CAPILLARY: 193 mg/dL — AB (ref 65–99)
Glucose-Capillary: 152 mg/dL — ABNORMAL HIGH (ref 65–99)
Glucose-Capillary: 96 mg/dL (ref 65–99)
Glucose-Capillary: 151 mg/dL — ABNORMAL HIGH (ref 65–99)

## 2016-04-06 LAB — PATHOLOGIST SMEAR REVIEW

## 2016-04-06 MED ORDER — PANTOPRAZOLE SODIUM 40 MG PO PACK
40.0000 mg | PACK | ORAL | Status: DC
  Administered 2016-04-06 – 2016-04-07 (×2): 40 mg
  Filled 2016-04-06 (×3): qty 20

## 2016-04-06 NOTE — Progress Notes (Signed)
Palliative Medicine RN: Met with pt's wife and son. Wife states that MD met with them this am and explained prognosis; she also states that they understand the severity of the situation and will be "making decisions" today or tomorrow (she reports that ICU MD gave her option of waiting until tomorrow). Wife states she and pt had discussed goals before he started getting sick and that "they know what to do."  She is not ready at this time to set a time for terminal extubation. Explained that PMT will be available as a support during this time and provided PMT contact information. Discussed with ICU RN. Plan f/u with pt/family tomorrow no matter the decision/time table.  Larina Earthly, RN, BSN, [facility] 04/06/2016 12:12 PM Cell [phone] 8:00-4:00 Monday-Friday Office [phone]

## 2016-04-06 NOTE — Progress Notes (Signed)
PCCM PROGRESS NOTE  Dylan BennettRichard J Kaas is a 82 y.o. male admitted on 04/04/2016 with asystolic cardiac arrest.  He had evidence for severe anoxic brain injury on presentation.  He was not a candidate for target temperature management and cardiology assessment was deferred.  He was started on ABx for HCAP.  He had EEG which showed profound cerebral dysfunction consistent with anoxic injury.  Palliative care was consulted.  Subjective: Breaths over vent >> no other significant response.  Vital signs: BP 182/56 mmHg  Pulse 66  Temp(Src) 100.2 F (37.9 C) (Core (Comment))  Resp 23  Wt 222 lb 10.6 oz (101 kg)  SpO2 100%  General: unresponsive Neuro: comatose HEENT: ETT in place Cardiac: regular Chest: b/l rhonchi Abd: soft, non tender Ext: 1+ edema Skin: no rashes  CMP Latest Ref Rng 04/05/2016 04/04/2016 04/19/2016  Glucose 65 - 99 mg/dL 098(J173(H) 191(Y165(H) 782(N192(H)  BUN 6 - 20 mg/dL 56(O94(H) 13(Y78(H) 86(V64(H)  Creatinine 0.61 - 1.24 mg/dL 7.84(O4.54(H) 9.62(X3.34(H) 5.28(U2.01(H)  Sodium 135 - 145 mmol/L 142 142 143  Potassium 3.5 - 5.1 mmol/L 5.1 4.9 4.8  Chloride 101 - 111 mmol/L 105 104 103  CO2 22 - 32 mmol/L 21(L) 23 23  Calcium 8.9 - 10.3 mg/dL 8.3(L) 8.4(L) 9.0  Total Protein 6.5 - 8.1 g/dL - - 6.0(L)  Total Bilirubin 0.3 - 1.2 mg/dL - - 0.7  Alkaline Phos 38 - 126 U/L - - 54  AST 15 - 41 U/L - - 153(H)  ALT 17 - 63 U/L - - 125(H)     CBC Latest Ref Rng 04/05/2016 03/30/2016 04/02/2016  WBC 4.0 - 10.5 K/uL 20.2(H) 15.6(H) -  Hemoglobin 13.0 - 17.0 g/dL 1.3(K9.8(L) 4.4(W9.7(L) 11.6(L)  Hematocrit 39.0 - 52.0 % 29.2(L) 31.7(L) 34.0(L)  Platelets 150 - 400 K/uL 176 173 -    Dg Chest Port 1 View  04/05/2016  CLINICAL DATA:  Respiratory failure. EXAM: PORTABLE CHEST 1 VIEW COMPARISON:  04/11/2016. FINDINGS: Endotracheal tube and NG tube in stable position. Prior CABG. Cardiomegaly with pulmonary vascular prominence. Mild bibasilar atelectasis and or infiltrates noted again on today's exam. Left costophrenic angle not  imaged. No pneumothorax. IMPRESSION: 1. Lines and tubes stable position. 2. Prior CABG.  Cardiomegaly with mild pulmonary venous congestion. 3. Mild atelectasis and/or infiltrates in the lung bases again noted on today's exam. No interim change. Electronically Signed   By: Maisie Fushomas  Register   On: 04/05/2016 06:57    Assessment: Cardiac arrest Acute on chronic hypoxic respiratory failure Anoxic encephalopathy Myoclonus  Sepsis from HCAP AKI Acute systolic CHF NSTEMI Hx of CAD s/p CABG Hx of HTN Hx of DM Hx of recurrent pneumonia   Plan: Full vent support D/c Abx >> no benefit at this point Defer further lab testing or imaging studies Limited resuscitation >> no CPR, no defibrillation Not candidate for HD F/u with palliative care Continue discuss with pt's family about goals of care >> comfort measures likely best option   Coralyn HellingVineet Kardell Virgil, MD [facility] 04/06/2016, 8:56 AM Pager:  [phone] After 3pm call: [phone]

## 2016-04-07 LAB — GLUCOSE, CAPILLARY
GLUCOSE-CAPILLARY: 468 mg/dL — AB (ref 65–99)
GLUCOSE-CAPILLARY: 532 mg/dL — AB (ref 65–99)
Glucose-Capillary: 348 mg/dL — ABNORMAL HIGH (ref 65–99)

## 2016-04-07 MED ORDER — LORAZEPAM 2 MG/ML IJ SOLN: 1.0000 mg | INTRAMUSCULAR | Status: DC | PRN

## 2016-04-07 MED ORDER — MORPHINE SULFATE (PF) 2 MG/ML IV SOLN: 1.0000 mg | INTRAVENOUS | Status: DC | PRN

## 2016-04-07 NOTE — Progress Notes (Signed)
Palliative Medicine RN Note: Visited with pt's wife and son. They provided copy of advance directive (requesting no artificial life prolongation) and HCPOA (it names daughter as primary and son as secondary, but daughter cannot be located; son indicates he wants pt's wife Venita SheffieldGladys to make decisions and that he is in agreement). Family is waiting on pt's sister to arrive tonight around 1700 and indicates that decisions will be made then regarding timing.   Pt with RR up to 40/min per RRT. Discussed w Dr Phillips OdorGolding; new orders obtained. Pt has listed allergy to morphine with reaction "altered mental status." Due to pt being on the vent with plan for one way extubation, this medication is still indicated per Dr Phillips OdorGolding. Plan f/u by PMT member tomorrow and prn.  Donn PieriniMelanie G. Oliver, RN, BSN, [facility] 04/07/2016 12:30 PM Cell [phone] 8:00-4:00 Monday-Friday Office [phone]

## 2016-04-07 NOTE — Progress Notes (Signed)
PCCM PROGRESS NOTE  Dylan BennettRichard J Barron is a 82 y.o. male admitted on 04/05/2016 with asystolic cardiac arrest.  He had evidence for severe anoxic brain injury on presentation.  He was not a candidate for target temperature management and cardiology assessment was deferred.  He was started on ABx for HCAP.  He had EEG which showed profound cerebral dysfunction consistent with anoxic injury.  Palliative care was consulted.  Subjective: No change.  Vital signs: BP 162/55 mmHg  Pulse 69  Temp(Src) 97.3 F (36.3 C) (Core (Comment))  Resp 37  Wt 216 lb 14.9 oz (98.4 kg)  SpO2 100%  General: unresponsive Neuro: comatose HEENT: ETT in place Cardiac: regular Chest: b/l rhonchi Abd: soft, non tender Ext: 1+ edema Skin: no rashes  CMP Latest Ref Rng 04/05/2016 04/04/2016 03/29/2016  Glucose 65 - 99 mg/dL 161(W173(H) 960(A165(H) 540(J192(H)  BUN 6 - 20 mg/dL 81(X94(H) 91(Y78(H) 78(G64(H)  Creatinine 0.61 - 1.24 mg/dL 9.56(O4.54(H) 1.30(Q3.34(H) 6.57(Q2.01(H)  Sodium 135 - 145 mmol/L 142 142 143  Potassium 3.5 - 5.1 mmol/L 5.1 4.9 4.8  Chloride 101 - 111 mmol/L 105 104 103  CO2 22 - 32 mmol/L 21(L) 23 23  Calcium 8.9 - 10.3 mg/dL 8.3(L) 8.4(L) 9.0  Total Protein 6.5 - 8.1 g/dL - - 6.0(L)  Total Bilirubin 0.3 - 1.2 mg/dL - - 0.7  Alkaline Phos 38 - 126 U/L - - 54  AST 15 - 41 U/L - - 153(H)  ALT 17 - 63 U/L - - 125(H)     CBC Latest Ref Rng 04/05/2016 04/11/2016 03/31/2016  WBC 4.0 - 10.5 K/uL 20.2(H) 15.6(H) -  Hemoglobin 13.0 - 17.0 g/dL 4.6(N9.8(L) 6.2(X9.7(L) 11.6(L)  Hematocrit 39.0 - 52.0 % 29.2(L) 31.7(L) 34.0(L)  Platelets 150 - 400 K/uL 176 173 -    No results found.  Assessment: Cardiac arrest Acute on chronic hypoxic respiratory failure Anoxic encephalopathy Myoclonus  Sepsis from HCAP AKI >> not candidate for renal replacement therapy Acute systolic CHF NSTEMI Hx of CAD s/p CABG Hx of HTN Hx of DM Hx of recurrent pneumonia   Plan: Full vent support Defer further lab testing or imaging studies Limited resuscitation  >> no CPR, no defibrillation  Spoke with family today.  They understand that he would not want to continue on vent support with no hope of meaningful neuro improvement.  Awaiting arrival of his sister from [place] later today >> will then likely transition to comfort measures with vent withdrawal.  Reviewed process for this.  Also explained that time course for dying process is variable, and that he might need to be transferred to floor bed after extubation.   Coralyn HellingVineet Shritha Bresee, MD [facility] 04/07/2016, 9:39 AM Pager:  [phone] After 3pm call: [phone]

## 2016-04-08 LAB — CULTURE, BLOOD (ROUTINE X 2)
CULTURE: NO GROWTH
CULTURE: NO GROWTH
Culture: NO GROWTH
Culture: NO GROWTH

## 2016-04-08 MED ORDER — LORAZEPAM 2 MG/ML IJ SOLN
1.0000 mg | INTRAMUSCULAR | Status: DC | PRN
  Administered 2016-04-08 – 2016-04-09 (×6): 2 mg via INTRAVENOUS
  Administered 2016-04-09: 1 mg via INTRAVENOUS
  Administered 2016-04-09: 2 mg via INTRAVENOUS
  Filled 2016-04-08 (×8): qty 1

## 2016-04-08 MED ORDER — ATROPINE SULFATE 1 % OP SOLN
4.0000 [drp] | OPHTHALMIC | Status: DC | PRN
  Administered 2016-04-09: 4 [drp] via SUBLINGUAL
  Filled 2016-04-08: qty 5
  Filled 2016-04-08: qty 2

## 2016-04-08 MED ORDER — MORPHINE SULFATE (PF) 2 MG/ML IV SOLN
1.0000 mg | INTRAVENOUS | Status: DC | PRN
  Administered 2016-04-08 (×2): 2 mg via INTRAVENOUS
  Filled 2016-04-08 (×2): qty 1

## 2016-04-08 NOTE — Care Management Note (Signed)
Case Management Note  Patient Details  Name: Jeralyn BennettRichard J Kirkland MRN: [id number] Date of Birth: [birth year]  Subjective/Objective:  Pt is s/p cardiac arrest                  Action/Plan:  PTA independent from home with wife.  Pt on home O2.  CM will continue to monitor for disposition needs   Expected Discharge Date:                  Expected Discharge Plan:  Home w Home Health Services  In-House Referral:     Discharge planning Services  CM Consult  Post Acute Care Choice:    Choice offered to:     DME Arranged:    DME Agency:     HH Arranged:    HH Agency:     Status of Service:  In process, will continue to follow  Medicare Important Message Given:    Date Medicare IM Given:    Medicare IM give by:    Date Additional Medicare IM Given:    Additional Medicare Important Message give by:     If discussed at Long Length of Stay Meetings, dates discussed:    Additional Comments: 04/08/2016 Tentative plan is to transition today to comfort care - CM will continue to monitor for disposition needs [name], Lori Liew S, RN 04/08/2016, 8:24 AM

## 2016-04-08 NOTE — Progress Notes (Signed)
Patient admitted to room 6N08 from 2 [place] respirations labored and scattered rales family at bedside comfort cart ordered

## 2016-04-08 NOTE — Progress Notes (Signed)
   04/08/16 1000  Clinical Encounter Type  Visited With Family  Visit Type Patient actively dying  Referral From Nurse  Spiritual Encounters  Spiritual Needs Prayer;Emotional;Grief support  CH responded to EOL consult to offer grief and emotional support to family as pt actively dying; Family pastor is en route; CH will support as needed.  Dylan LevineMichael I Dream Barron 10:05 AM

## 2016-04-08 NOTE — Progress Notes (Signed)
PCCM PROGRESS NOTE  Dylan BennettRichard J Barron is a 82 y.o. male admitted on 04/17/2016 with asystolic cardiac arrest.  He had evidence for severe anoxic brain injury on presentation.  He was not a candidate for target temperature management and cardiology assessment was deferred.  He was started on ABx for HCAP.  He had EEG which showed profound cerebral dysfunction consistent with anoxic injury.  Palliative care was consulted.  Subjective: No change.  Vital signs: BP 173/61 mmHg  Pulse 78  Temp(Src) 98.1 F (36.7 C) (Core (Comment))  Resp 24  Wt 209 lb 14.1 oz (95.2 kg)  SpO2 100%  General: unresponsive Neuro: comatose HEENT: ETT in place Cardiac: regular Chest: b/l rhonchi Abd: soft, non tender Ext: 1+ edema Skin: no rashes  CMP Latest Ref Rng 04/05/2016 04/04/2016 04/06/2016  Glucose 65 - 99 mg/dL 409(W173(H) 119(J165(H) 478(G192(H)  BUN 6 - 20 mg/dL 95(A94(H) 21(H78(H) 08(M64(H)  Creatinine 0.61 - 1.24 mg/dL 5.78(I4.54(H) 6.96(E3.34(H) 9.52(W2.01(H)  Sodium 135 - 145 mmol/L 142 142 143  Potassium 3.5 - 5.1 mmol/L 5.1 4.9 4.8  Chloride 101 - 111 mmol/L 105 104 103  CO2 22 - 32 mmol/L 21(L) 23 23  Calcium 8.9 - 10.3 mg/dL 8.3(L) 8.4(L) 9.0  Total Protein 6.5 - 8.1 g/dL - - 6.0(L)  Total Bilirubin 0.3 - 1.2 mg/dL - - 0.7  Alkaline Phos 38 - 126 U/L - - 54  AST 15 - 41 U/L - - 153(H)  ALT 17 - 63 U/L - - 125(H)     CBC Latest Ref Rng 04/05/2016 04/11/2016 04/20/2016  WBC 4.0 - 10.5 K/uL 20.2(H) 15.6(H) -  Hemoglobin 13.0 - 17.0 g/dL 4.1(L9.8(L) 2.4(M9.7(L) 11.6(L)  Hematocrit 39.0 - 52.0 % 29.2(L) 31.7(L) 34.0(L)  Platelets 150 - 400 K/uL 176 173 -    No results found.  Assessment: Cardiac arrest Acute on chronic hypoxic respiratory failure Anoxic encephalopathy Myoclonus  Sepsis from HCAP AKI >> not candidate for renal replacement therapy Acute systolic CHF NSTEMI Hx of CAD s/p CABG Hx of HTN Hx of DM Hx of recurrent pneumonia   Plan: Plan to transition to comfort measures when family is ready  Dylan HellingVineet [name],  MD [facility] 04/08/2016, 8:20 AM Pager:  [phone] After 3pm call: [phone]

## 2016-04-08 NOTE — Procedures (Signed)
Extubation Procedure Note  Patient Details:   Name: Dylan BennettRichard J Husband DOB: [birth year] MRN: [id number]   Airway Documentation:     Evaluation  O2 sats: stable throughout Complications: Complications of terminal extubation Patient did tolerate procedure well. Bilateral Breath Sounds: Diminished, Clear   No   Terminal extubation done at this time. No gag. Patient has spontaneous respirations at this time. Sat 100% on 1 LNC. Family now at bedside  Lurlean LeydenDick, Oaklynn Stierwalt Bailey 04/08/2016, 9:55 AM

## 2016-04-12 ENCOUNTER — Telehealth: Payer: Self-pay

## 2016-04-12 NOTE — Telephone Encounter (Signed)
On 04/12/2016 I received a death certificate from [employer] (original). The death certificate is for burial. The patient is a patient of Doctor Sood. The death certificate will be taken to Pulmonary Unit @ [facility] tomorrow (04/13/2016) for signature.  On 04/13/2016 I received the death certificate back from Doctor [place]. I got the death certificate ready and called the funeral home to let them know the death certificate is ready for pickup.

## 2016-04-21 NOTE — Discharge Summary (Signed)
Name:[name] RUE:[id number] DOB:Oct 28, 1928   ADMISSION DATE: 09-29-2016 DATE OF DEATH: 04/05/2016  Dylan BennettRichard J Barron is a 82 y.o. male with PMH of with past medical history of Coronary artery disease, CABG, MI, peripheral neuropathy, hypertension, hyperlipidemia, diabetes, recent cardiac cath December 2016, recurrent pneumonia, congestive heart failure. He was admitted on 04-10-2016 with acute MI and transferred to rehab.  He was readmitted on 09-29-2016 with asystolic cardiac arrest. He had evidence for severe anoxic brain injury on presentation. He was not a candidate for target temperature management and cardiology assessment was deferred. He was started on ABx for HCAP. He had EEG which showed profound cerebral dysfunction consistent with anoxic injury. Palliative care was consulted. After discussion with the family he was transitioned to full comfort measures with extubation on 5/18. He passed away on 5/19.  Dylan GreathousePraveen Tyshawn Ciullo MD [facility] Pager [phone] If no answer or after 3pm call: [phone] 04/21/2016, 1:12 AM,s

## 2016-04-22 NOTE — Progress Notes (Signed)
 1mg  iv ativan administered for increased work of breathing. Son at bedside

## 2016-04-22 NOTE — Progress Notes (Signed)
PCCM PROGRESS NOTE  Dylan BennettRichard J Waid is a 82 y.o. male admitted on 04/14/2016 with asystolic cardiac arrest.  He had evidence for severe anoxic brain injury on presentation.  He was not a candidate for target temperature management and cardiology assessment was deferred.  He was started on ABx for HCAP.  He had EEG which showed profound cerebral dysfunction consistent with anoxic injury.  Palliative care was consulted.  Subjective: Transferred to 6N. Agonal breathing  Vital signs: BP 158/47 mmHg  Pulse 83  Temp(Src) 97.4 F (36.3 C) (Axillary)  Resp 28  Wt 209 lb 14.1 oz (95.2 kg)  SpO2 92%  General: no distress Neuro: Unresponsive HEENT: No thyromegaly, JVD Cardiac: RRR, no MRG Chest: B/l Rhonchi Abd: Soft, NT, ND Ext: 1+ edema Skin: no rashes  CMP Latest Ref Rng 04/05/2016 04/04/2016 04/14/2016  Glucose 65 - 99 mg/dL 161(W173(H) 960(A165(H) 540(J192(H)  BUN 6 - 20 mg/dL 81(X94(H) 91(Y78(H) 78(G64(H)  Creatinine 0.61 - 1.24 mg/dL 9.56(O4.54(H) 1.30(Q3.34(H) 6.57(Q2.01(H)  Sodium 135 - 145 mmol/L 142 142 143  Potassium 3.5 - 5.1 mmol/L 5.1 4.9 4.8  Chloride 101 - 111 mmol/L 105 104 103  CO2 22 - 32 mmol/L 21(L) 23 23  Calcium 8.9 - 10.3 mg/dL 8.3(L) 8.4(L) 9.0  Total Protein 6.5 - 8.1 g/dL - - 6.0(L)  Total Bilirubin 0.3 - 1.2 mg/dL - - 0.7  Alkaline Phos 38 - 126 U/L - - 54  AST 15 - 41 U/L - - 153(H)  ALT 17 - 63 U/L - - 125(H)     CBC Latest Ref Rng 04/05/2016 03/31/2016 04/06/2016  WBC 4.0 - 10.5 K/uL 20.2(H) 15.6(H) -  Hemoglobin 13.0 - 17.0 g/dL 4.6(N9.8(L) 6.2(X9.7(L) 11.6(L)  Hematocrit 39.0 - 52.0 % 29.2(L) 31.7(L) 34.0(L)  Platelets 150 - 400 K/uL 176 173 -    No results found.  Assessment: Cardiac arrest Acute on chronic hypoxic respiratory failure Anoxic encephalopathy Myoclonus  Sepsis from HCAP AKI >> not candidate for renal replacement therapy Acute systolic CHF NSTEMI Hx of CAD s/p CABG Hx of HTN Hx of DM Hx of recurrent pneumonia   Plan: Full comfort measures Ativan as needed. Pt is allergic to  morphine Will likely pass away soon as he has agonal breathing today.  Chilton GreathousePraveen Mikenzi Raysor MD [facility] Pager [phone] If no answer or after 3pm call: [phone] 01/28/2016, 1:08 PM

## 2016-04-22 NOTE — Progress Notes (Signed)
Patient expired @ 955 pm. Dr Isaiah SergeMannam notified at this time

## 2016-04-22 NOTE — Progress Notes (Signed)
 2mg  iv ativan administered for comfort

## 2016-04-22 DEATH — deceased

## 2016-06-02 ENCOUNTER — Telehealth: Payer: Self-pay | Admitting: Pulmonary Disease

## 2016-06-02 NOTE — Telephone Encounter (Signed)
ATC # provided x 3. Line does not ring after dialing #. WCB

## 2016-06-03 NOTE — Telephone Encounter (Signed)
Spoke with [employer]. She reports she is speaking with Maggie FontKeith Cottle in regards to the message and he is suppose to be speak with Dr. Craige CottaSood about her questions.  Spoke with Mellody Dancekeith and he is taking care of this. Will sign off message

## 2016-06-03 NOTE — Telephone Encounter (Signed)
lmtcb x1 

## 2016-06-07 ENCOUNTER — Telehealth: Payer: Self-pay | Admitting: Cardiovascular Disease

## 2016-06-07 NOTE — Telephone Encounter (Signed)
New message      The wife spoke to someone on Friday to pick up the disk of the echo, she wants to make sure they are ready

## 2016-06-07 NOTE — Telephone Encounter (Signed)
Wife came in office today brought copy of Death Certificate paperwork to be able to pick up the Echo  Cd on her Husband considering he has passed away. She did not however have a copy of the Letter of  Testamentary. She is going to obtain a copy of this and bring back. I have Echo being Copied. Once  Echo CD is ready I will give her a Call.

## 2016-06-08 ENCOUNTER — Telehealth: Payer: Self-pay | Admitting: Cardiovascular Disease

## 2016-06-08 NOTE — Telephone Encounter (Signed)
Called wife she is aware CD's of Echo are ready-she will come by once she has all paperwork together.

## 2016-10-11 ENCOUNTER — Telehealth: Payer: Self-pay | Admitting: Pulmonary Disease

## 2016-10-11 NOTE — Telephone Encounter (Signed)
I saw one day Drs Craige CottaSood & Mannam saw- PM did dc summary - pl fwd to him

## 2016-10-11 NOTE — Telephone Encounter (Signed)
Called and spoke to pt's wife, [name]. She is requesting to speak with RA regarding her husband's death. Gladys states RA was the attending MD when pt expired. [name] states she is available any time through the day aside from 11.21.17 from 10-12pm and can be reached at [phone].   Dr. Vassie LollAlva, please advise. Thanks.

## 2016-10-12 NOTE — Telephone Encounter (Signed)
PM  Please see previous messages

## 2016-10-17 IMAGING — CR DG CHEST 1V PORT
1 series · 1 of 1 positions shown · non-contrast
Comparison: 11/15/2015

CLINICAL DATA: Respiratory failure, ventilatory support

EXAM:
PORTABLE CHEST 1 VIEW

[AP]
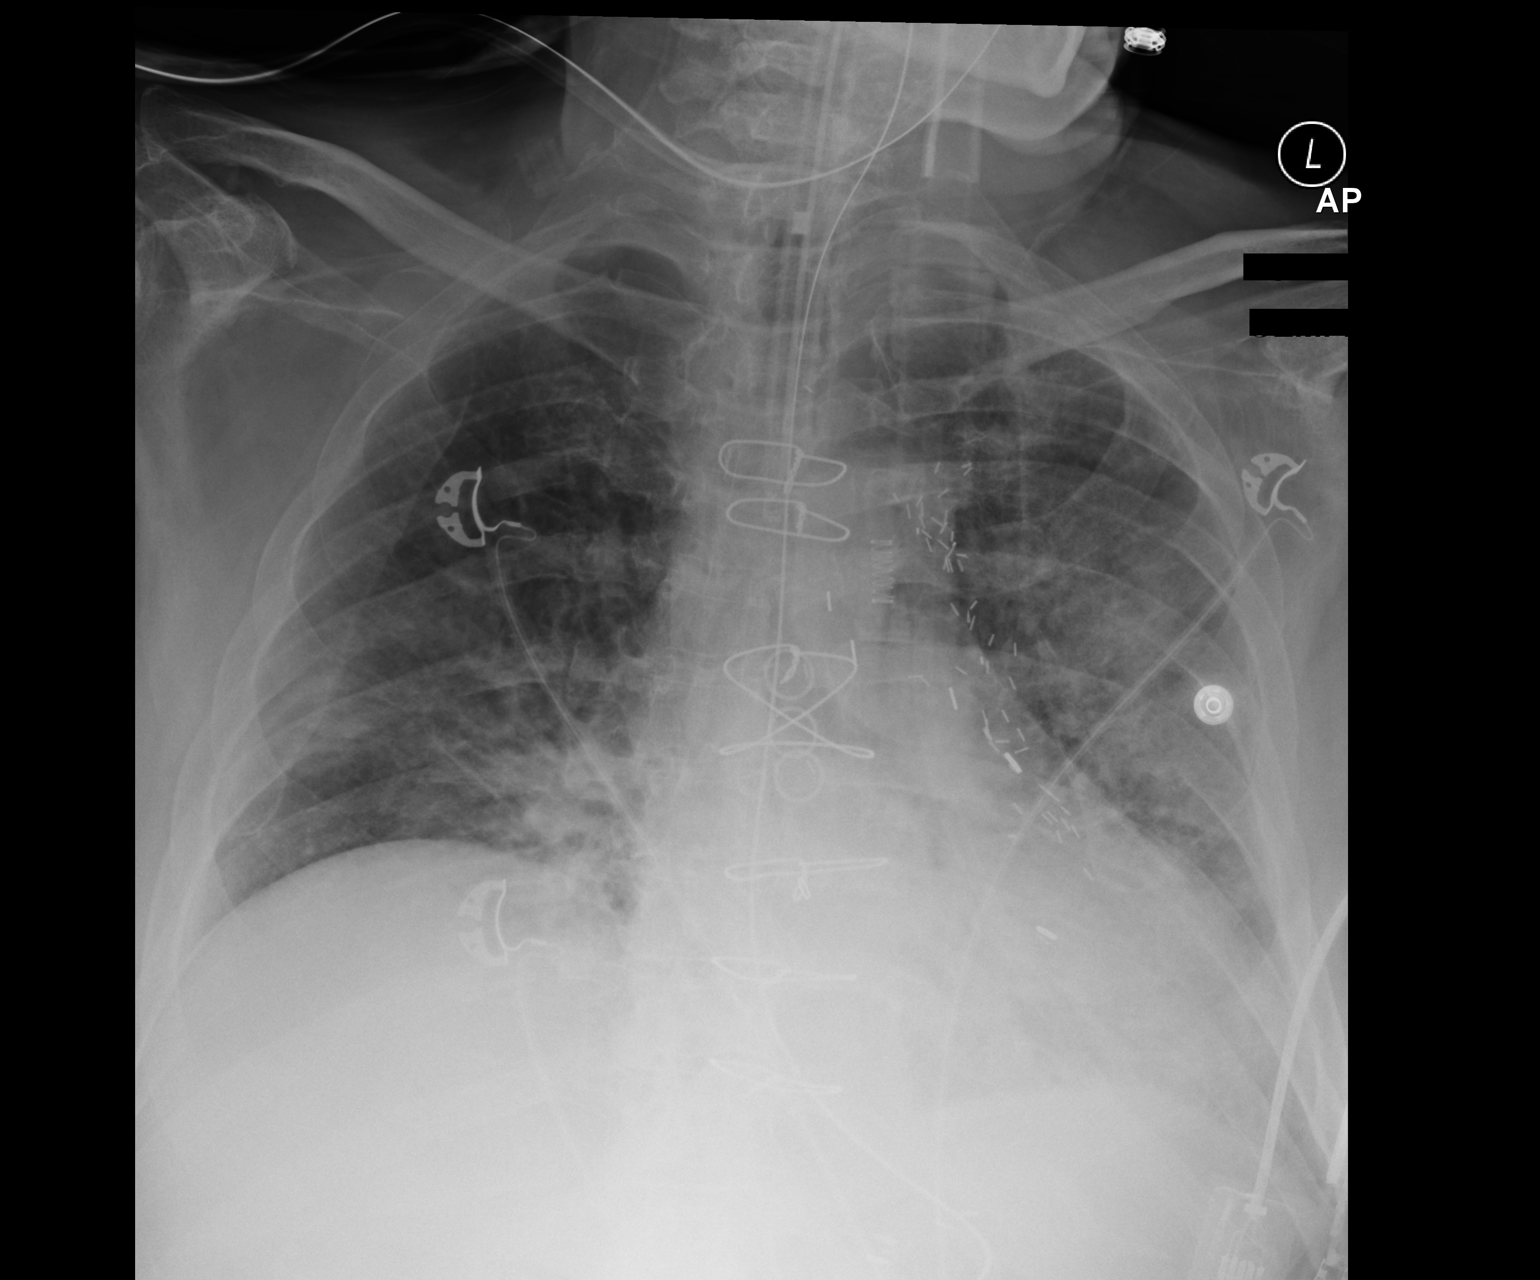

[1 of 1 positions shown; findings below may reference images not displayed]

FINDINGS: Endotracheal tube 2.5 cm above the carina. NG tube enters the
stomach with the tip not visualized. Prior coronary bypass changes
noted. Stable cardiomegaly with similar asymmetric airspace
opacities throughout the left lung and right lower lobe. No
significant interval change in aeration pattern. No enlarging
effusion or pneumothorax.
IMPRESSION: Stable support apparatus.

No change in asymmetric airspace disease, worse in the left lung
compatible with pneumonia versus asymmetric edema.

## 2016-10-20 IMAGING — CR DG CHEST 1V PORT
1 series · 1 of 1 positions shown · non-contrast
Comparison: 11/17/2015

CLINICAL DATA: Shortness of breath.

EXAM:
PORTABLE CHEST 1 VIEW

[AP]
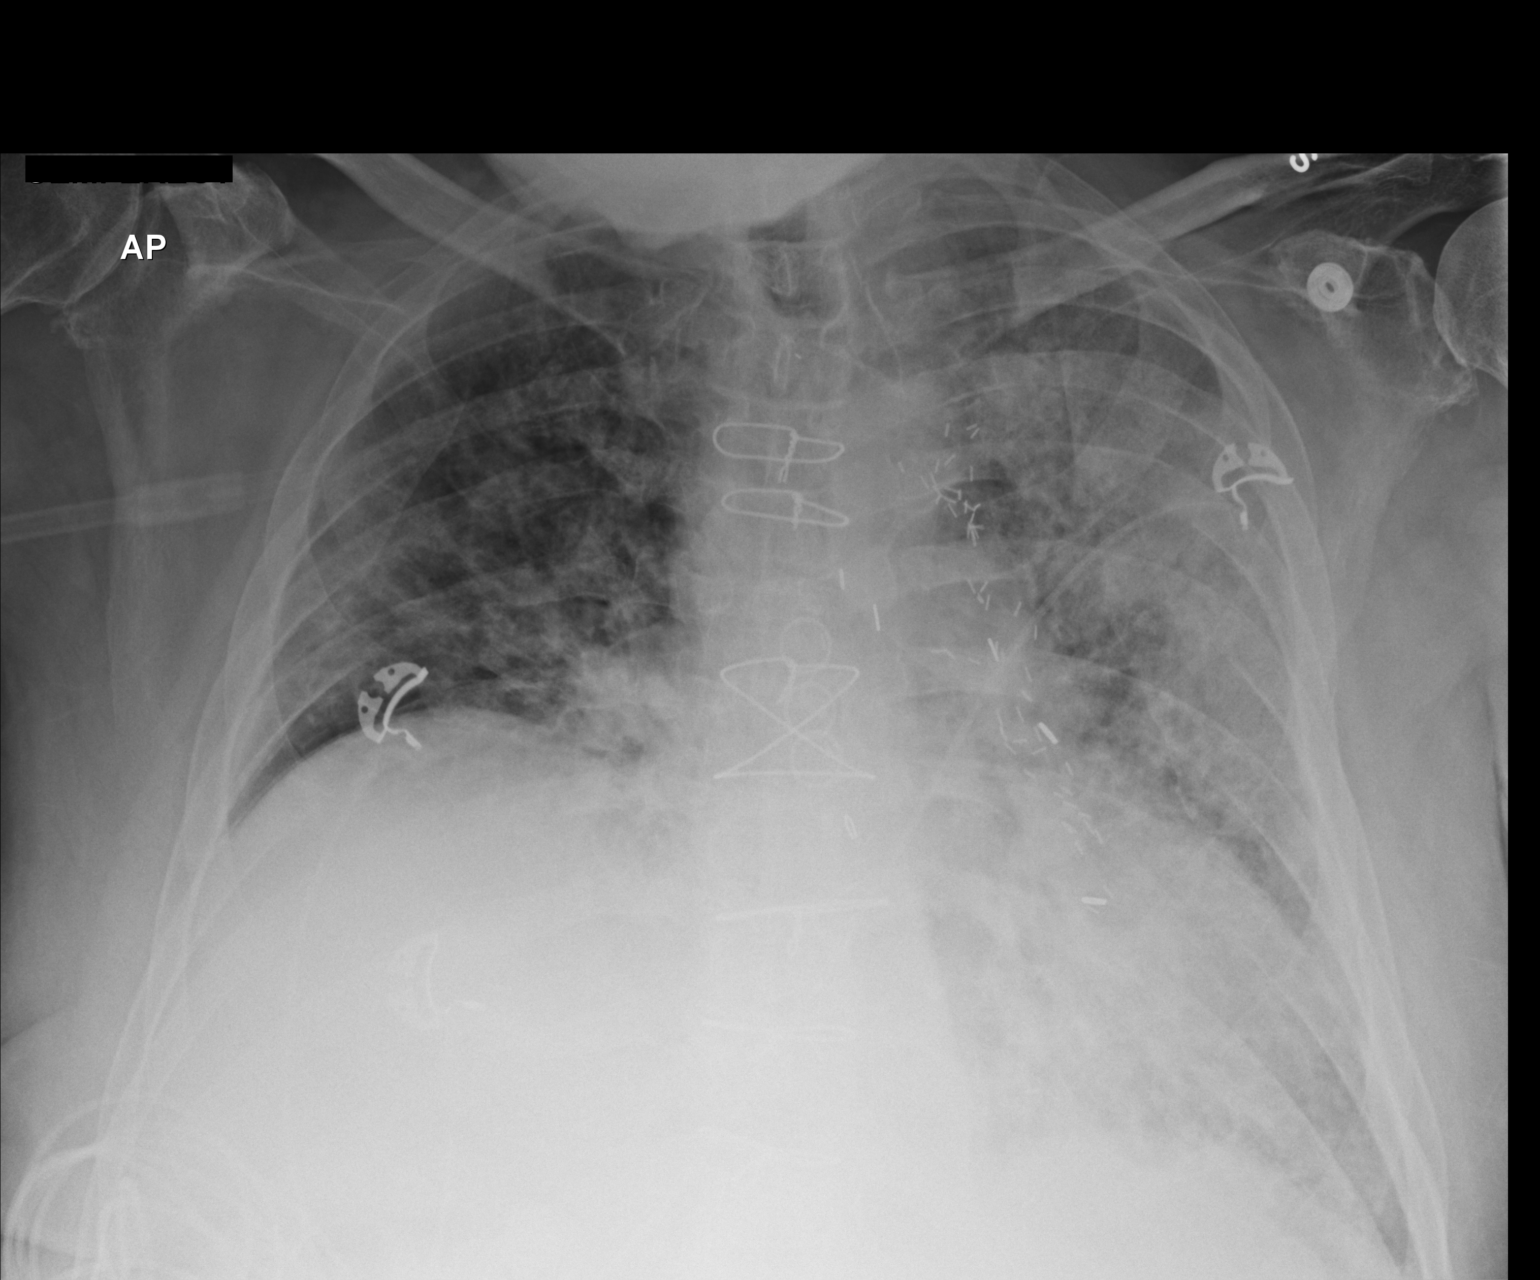

[1 of 1 positions shown; findings below may reference images not displayed]

FINDINGS: Since the prior exam, the support apparatus has been removed.

There is airspace consolidation throughout the left lung. There are
irregular interstitial opacities in the central lower right lung.
Lung opacities have increased when compared to the prior exam. There
is elevation of the right hemidiaphragm, more prominent than on the
prior study.

Changes from CABG surgery are stable. Cardiac silhouette is normal
in size. No mediastinal or convincing hilar masses. No pneumothorax
noted on this semi-erect exam.
IMPRESSION: 1. Worsened lung aeration when compared to the prior exam with
increased irregular interstitial opacities on the right increased
airspace opacity on the left. Elevation the right hemidiaphragm is
more accentuated than on the prior study.

## 2016-10-21 NOTE — Telephone Encounter (Signed)
Della GooGladys Campi, patient's wife, is asking Dr. Isaiah SergeMannam to please call her at [phone] or cell [phone]. She also states she has written a letter she would like to give to Dr. Isaiah SergeMannam and will bring this by the office.

## 2016-10-22 NOTE — Telephone Encounter (Signed)
Rec'd letter from patient wife dropped off explaining situation surrounding patient's death, along with form that needs to be completed. Placed in PM's folder -pr

## 2016-10-22 NOTE — Telephone Encounter (Signed)
PM please contact patient's spouse- see below messages.  Thank you.

## 2016-10-22 NOTE — Telephone Encounter (Signed)
Will forward to PenceJessica to follow up on

## 2016-10-28 NOTE — Telephone Encounter (Signed)
Letter placed in PM's red folder to be addressed when PM returns to the office next week Routing to PM to make him aware

## 2016-11-01 ENCOUNTER — Telehealth: Payer: Self-pay | Admitting: Pulmonary Disease

## 2016-11-01 NOTE — Telephone Encounter (Signed)
Called and spoke pt's wife. She states she is needing the form completed by PM that she dropped off regarding pts death. Pt's wife states she would like this faxed to her at [phone] but states to call before you fax because she will need to turn the fax on.   Jess please advise if you have this and if its completed. Thanks.

## 2016-11-02 NOTE — Telephone Encounter (Signed)
Per 11.20.17 signed phone note, forms have been received and are in PM's red folder awaiting completion and signature.

## 2016-11-03 NOTE — Telephone Encounter (Signed)
Patient wife called and left message with answering service today at 12:07pm wanting forms - pr

## 2016-11-03 NOTE — Telephone Encounter (Signed)
Forms are in PM red folder. Pt asked that PM do this immediatly as she is being pressured by the insurance company. Will route to PM and JJ to f/u on

## 2016-11-04 NOTE — Telephone Encounter (Signed)
Spoke with patient wife, aware that per Dr Isaiah SergeMannam will review all the paperwork that is in his look at regarding the patient's death and will try and write a statement if possible. Pt wife aware that since the patient's brain injury happened in the patient's home and not under the care of Dr Isaiah SergeMannam, he can only write a statement about how the patient was when he was first evaluated in ED. Wife aware that we will call her as soon as this is completed - she states that insurance company is pushing for this ASAP.   Once completed(if able) patient wife requests that this be faxed to her home at [phone], please call first to let her know that you are about to fax the documents so that she can turn on her fax machine.   Will send to Dr Isaiah SergeMannam and Shanda BumpsJessica as Lorain ChildesFYI.

## 2016-11-04 NOTE — Telephone Encounter (Signed)
Patient wife in the lobby - wanting to speak to a nurse - pr

## 2016-11-05 ENCOUNTER — Telehealth: Payer: Self-pay | Admitting: Pulmonary Disease

## 2016-11-05 ENCOUNTER — Encounter: Payer: Self-pay | Admitting: *Deleted

## 2016-11-05 NOTE — Telephone Encounter (Signed)
Please type the following letter and send to wife  To whomsoever it may concern,  Mr Mickel CrowRedner was a 82 Y/O with PMH of CAD - s/p CABG, Peripheral neuropathy, HTN, Hyperlipidemia, Diabetes Mellitus. He has MI, severe diffuse coronary disease by cath in Dec. 2016. No PCI was done and medical therapy was advised. He's had several episodes of pneumonia over the past few months. He was admitted with heart failure, HCAP in Feb 2017 and discharged to rehab but remained deconditioned with frequent falls.   He was admitted on 2016/02/23  with a witnessed cardiac arrest at home. He collapsed when he got up to use the bathroom at 3 AM. No CPR administered for 15 minutes. He was found to be in asystole by EMS. Given 4 doses epi but lost pulses again. He arrived asystolic in ED. He was given more epinephrine. King airway found to be displaced in the mouth and patient was hypoxemic. After intubation he got ROSC. He was hypotensive after intubation and was started on peripheral Levophed. He had evidence of severe anoxic brain injury and eventually was made comfort care with withdrawal of medical treatement.  As per the wife the patient fell in the bathroom on day of admission with accidental entanglement of oxygen cord and stoppage off of his oxygen supply. He likely did not receive enough oxygen during this episode leading to his cardiac arrest and anoxic brain injury.  Please feel free to call me with any questions.  Chilton GreathousePraveen Lamae Fosco MD [facility] 11/05/2016, 1:28 PM

## 2016-11-05 NOTE — Telephone Encounter (Signed)
Letter printed and signed  I called the number listed for [name]  An elderly lady answered and stated she was not home and to call back later this pm  She could not hear well   WCB later today  Letter left in triage attached to yellow folder with other documents pt's spouse provided

## 2016-11-05 NOTE — Telephone Encounter (Signed)
Wife request to fax the letter to [phone].

## 2016-11-05 NOTE — Telephone Encounter (Signed)
Letter has been faxed to the number provided. Attempted to contact pt's wife. No answer and I couldn't leave a message.

## 2016-11-08 NOTE — Telephone Encounter (Signed)
Called and spoke to pt's wife. Informed her that the letter has been faxed. Pt states she has received this. Nothing further needed at this time.

## 2016-11-26 IMAGING — CR DG CHEST 2V
2 series · 2 of 2 positions shown · non-contrast
Comparison: 12/25/2015

CLINICAL DATA: Pneumonia for 1 month.  CHF.

EXAM:
CHEST  2 VIEW

[chest lat]
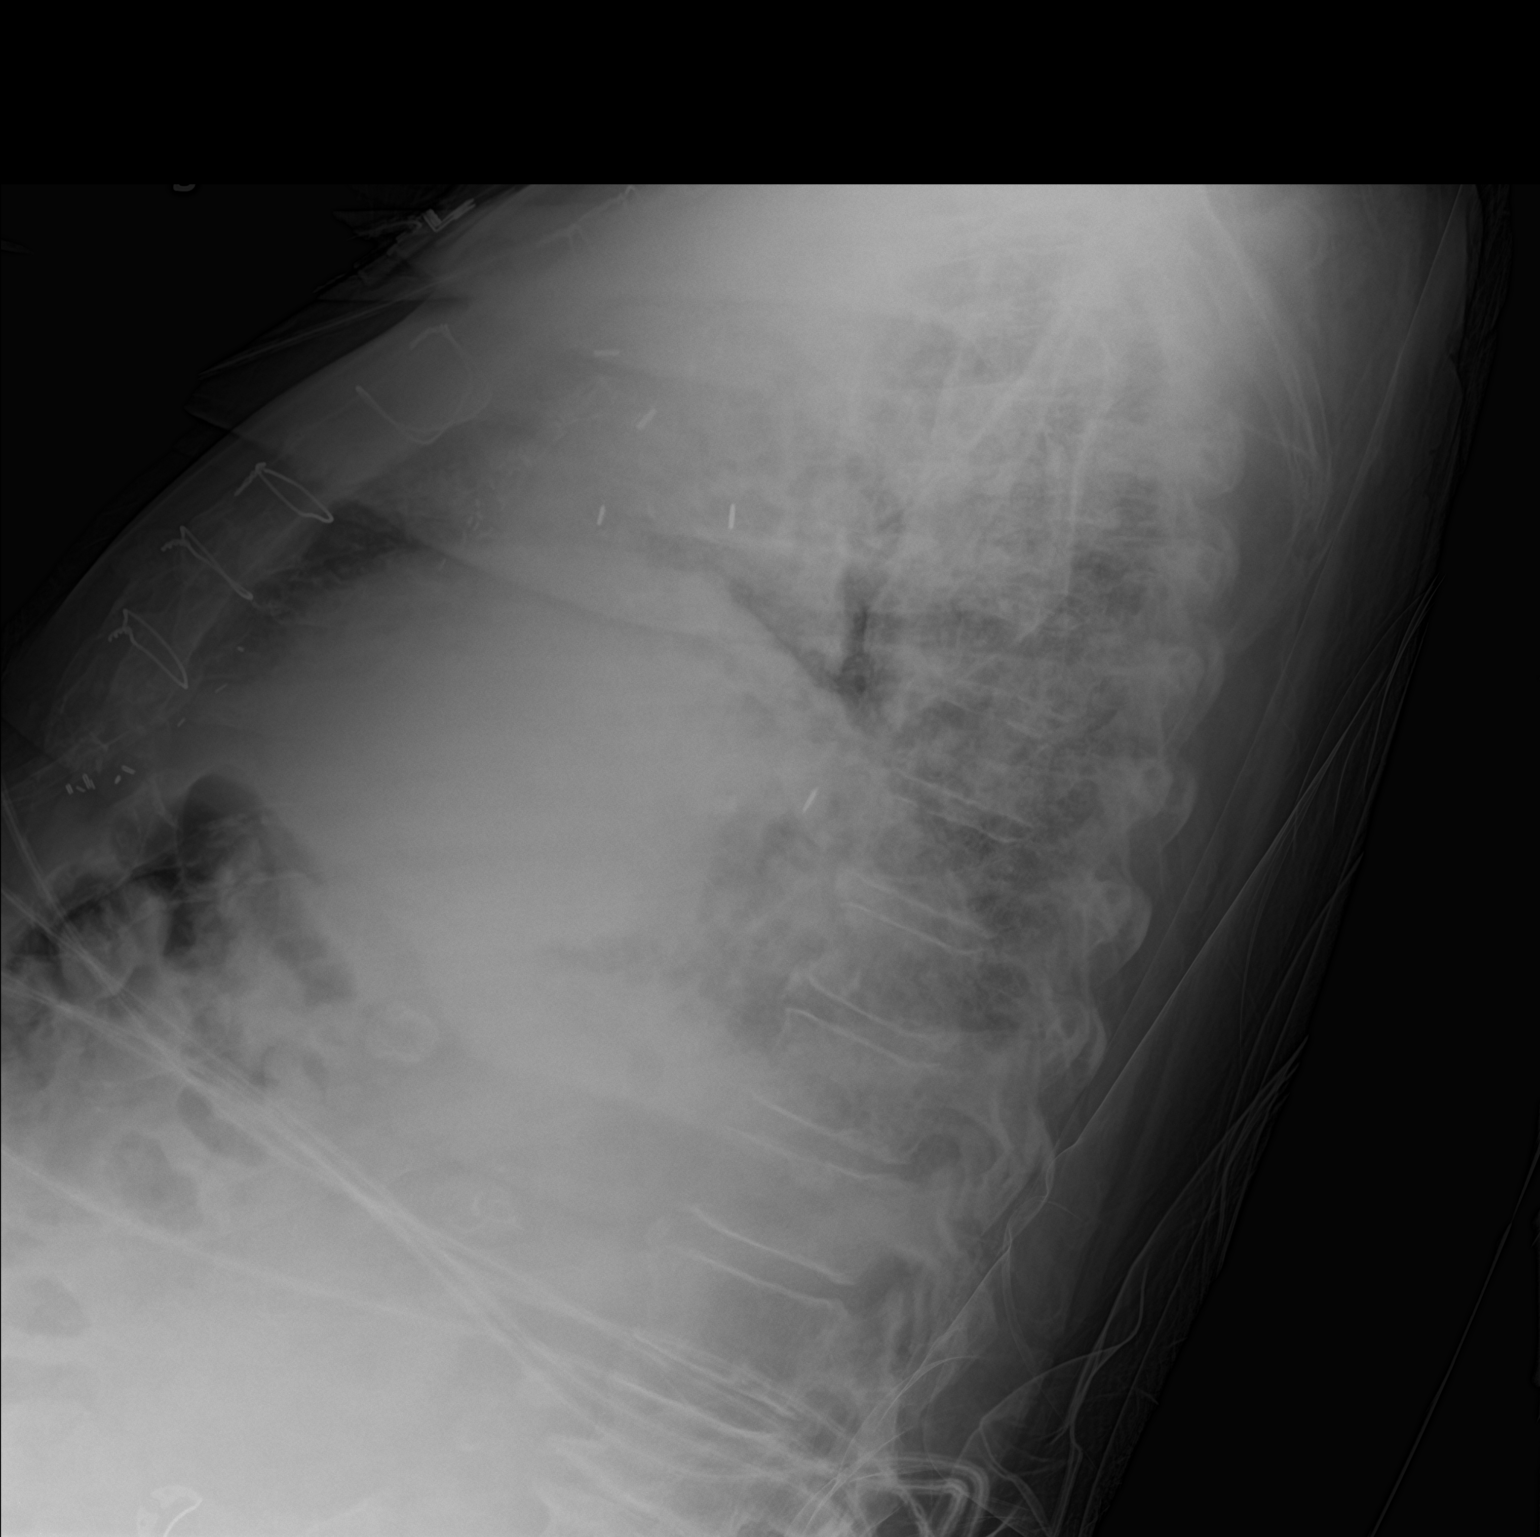

[chest ap]
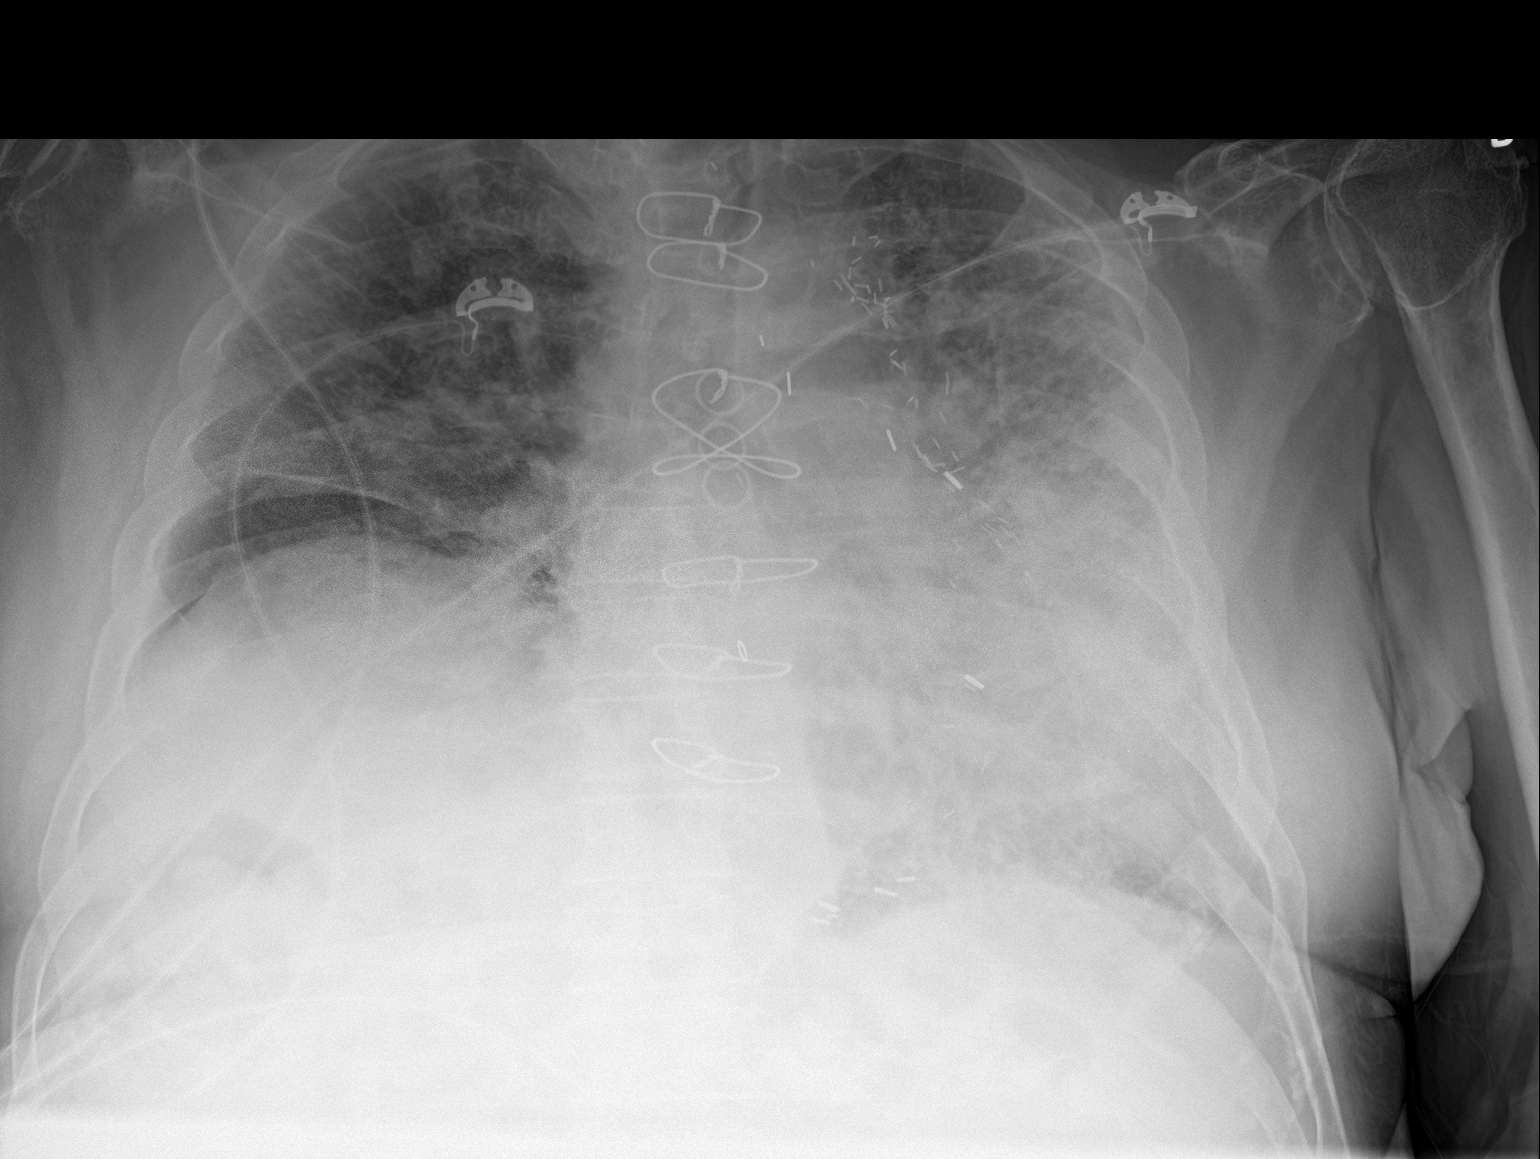

[2 of 2 positions shown; findings below may reference images not displayed]

FINDINGS: Sequelae of prior CABG are again identified. Cardiac and mediastinal
contours are largely obscured. There is chronic elevation of the
right hemidiaphragm. Diffuse bilateral airspace opacities involves
the left greater than right lungs and have not significantly
changed. No large pleural effusion or pneumothorax is identified.
IMPRESSION: Unchanged diffuse bilateral airspace disease.

## 2017-02-03 ENCOUNTER — Telehealth: Payer: Self-pay | Admitting: Pulmonary Disease

## 2017-02-03 NOTE — Telephone Encounter (Signed)
CalledGladys (pt's wife) to tell her that I have the forms for RA in my possession and RA will be back in the office on Monday to take a look at these forms.   I will call her once these forms have been filled out. She verbalized understanding and had no further questions.

## 2017-02-04 NOTE — Telephone Encounter (Signed)
Forms are in Dr Evlyn CourierSood's cubby to review upon his return

## 2017-02-04 NOTE — Telephone Encounter (Signed)
[name] please advise on these forms. thanks

## 2017-02-07 NOTE — Telephone Encounter (Signed)
RA received paperwork. Stated paperwork needed to be given to Dr. Isaiah SergeMannam and Dr. Craige CottaSood. Pt's wife wrote for Dr. Craige CottaSood, Dr. Vassie LollAlva, and Mellody DanceKeith. Envelope was given to Lur[place]Keith.

## 2017-02-07 NOTE — Telephone Encounter (Signed)
Forms given to VS to review.  Please advise. Thanks.

## 2017-02-11 NOTE — Telephone Encounter (Signed)
Wife calling [phone]

## 2017-02-11 NOTE — Telephone Encounter (Signed)
Wife returning call to speak to nurse.Dylan GriffinsStanley A Barron

## 2017-02-11 NOTE — Telephone Encounter (Signed)
Wife would like a call back today so she will know where things stand, says it been a/bout a wk since she has heard from anyone.Dylan GriffinsStanley A Barron

## 2017-02-11 NOTE — Telephone Encounter (Signed)
Spoke with [name] regarding this- per [name], he is working with the providers on this and hopes to have an update for pt's wife by Tuesday afternoon.    Spoke with pt's wife, aware of above recs.  Will continue to hold message for follow up.

## 2017-02-11 NOTE — Telephone Encounter (Signed)
VS sood please advise of these forms that the pts wife is requesting.  It looks like the forms were given to you on 3/19 and also given to MontaquaKeith.  Please advise as the wife is calling to check on the status of this.  thanks

## 2017-02-14 NOTE — Telephone Encounter (Signed)
I have asked Mellody DanceKeith to arrange meeting with his wife to discuss in more detail.

## 2017-03-04 IMAGING — CR DG CHEST 1V PORT
1 series · 1 of 1 positions shown · non-contrast
Comparison: 12/27/2015

CLINICAL DATA: Respiratory failure

EXAM:
PORTABLE CHEST 1 VIEW

[AP]
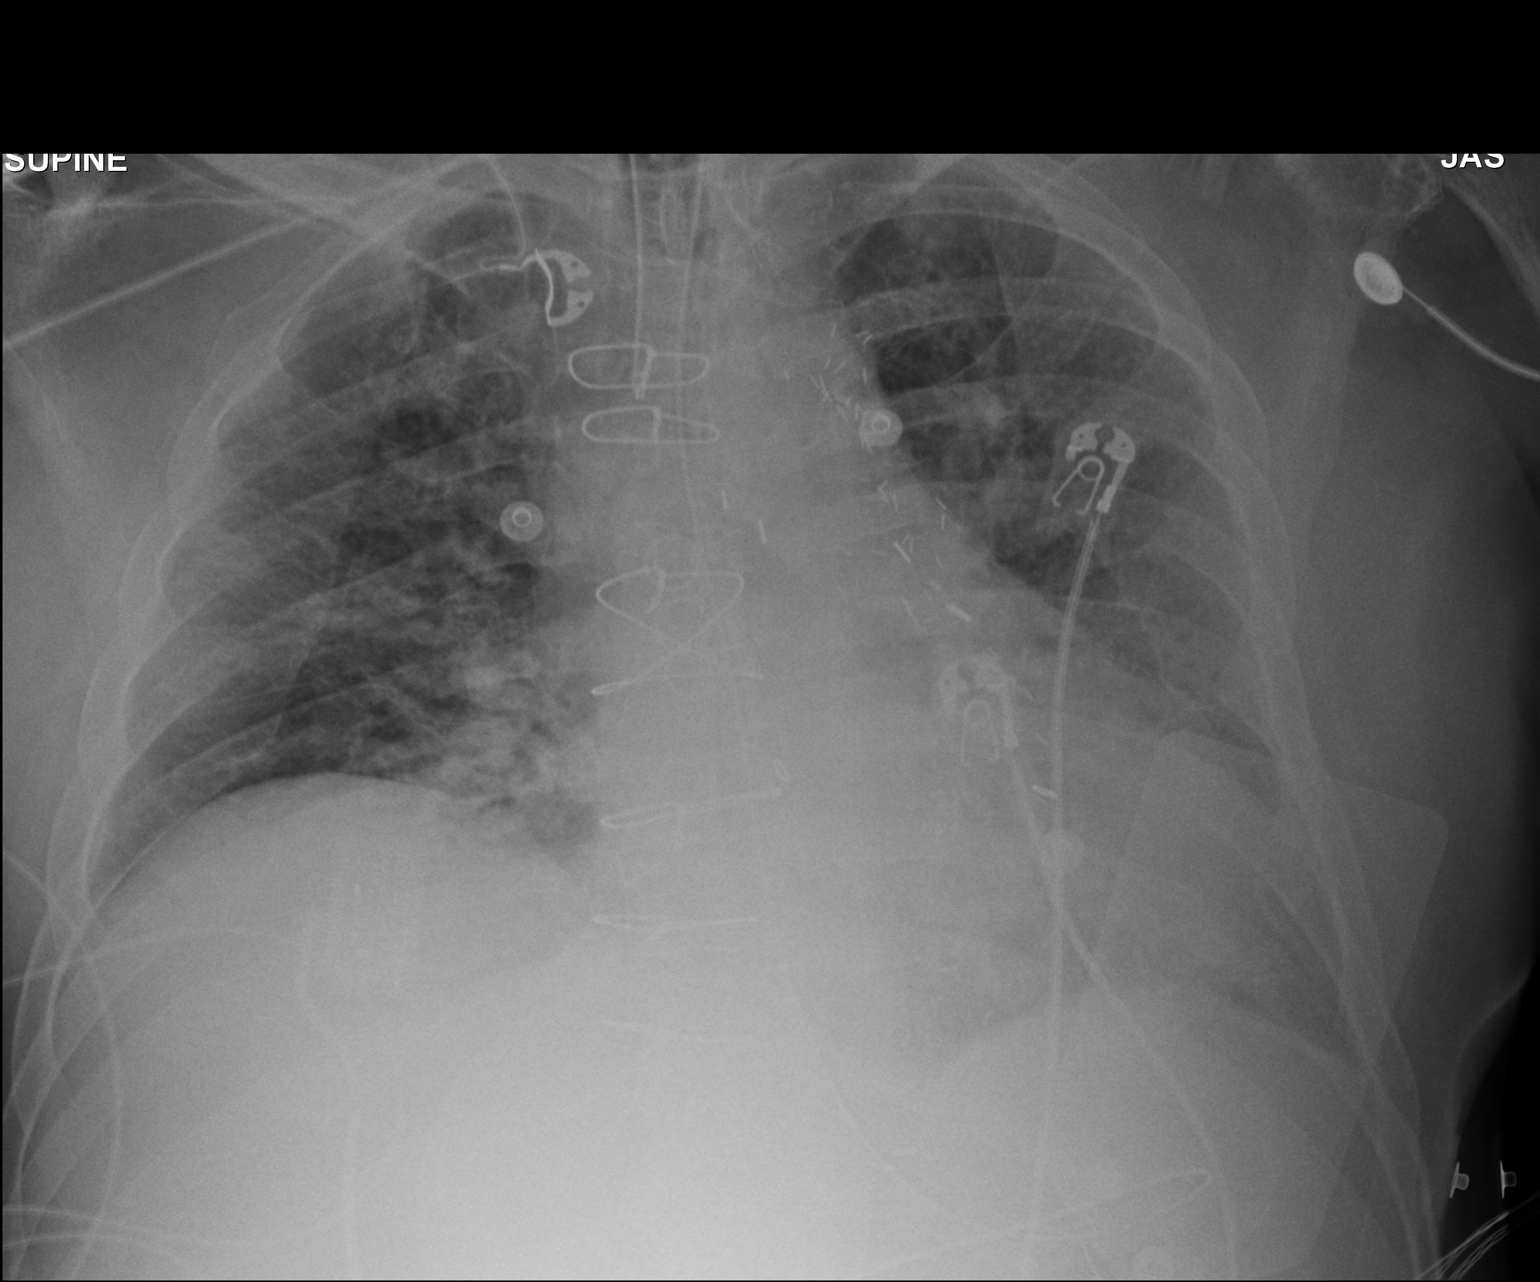

[1 of 1 positions shown; findings below may reference images not displayed]

FINDINGS: The endotracheal tube is 3.6 cm above the carina. The nasogastric
tube extends into the stomach. There is stable elevation the right
hemidiaphragm. Slight patchy opacities in the medial bases, without
confluent airspace consolidation. No large effusions. No
pneumothorax.
IMPRESSION: Support equipment appears satisfactorily positioned.

Mild patchy opacities in the bases.

## 2017-03-24 ENCOUNTER — Telehealth: Payer: Self-pay | Admitting: Pulmonary Disease

## 2017-03-24 NOTE — Telephone Encounter (Signed)
Per Rodell PernaPatrice and Tammy D, forms were given to Maggie FontKeith Cottle for further follow-up.  Will hold encounter to ensure this is completed.

## 2017-03-24 NOTE — Telephone Encounter (Signed)
Per Rodell PernaPatrice and Tammy D, these forms were given to Maggie FontKeith Cottle for further follow-up.  Will hold message to ensure completion.

## 2017-03-30 NOTE — Telephone Encounter (Signed)
Spoke with Mellody DanceKeith who states he does have form and VS is going to fill out his form. He states he will update pt's wife

## 2017-03-31 ENCOUNTER — Telehealth: Payer: Self-pay

## 2017-03-31 NOTE — Telephone Encounter (Signed)
On 03/31/17 I received a supplemental report from Dennis BastJoyce Johnson with [employer]. The report will be taken to [facility] [place] tomorrow am for correction.  On 04/05/17 I received the supplemental report back from Doctor TallmadgeSood. I called Alona BeneJoyce at [employer] to let her know it was ready for pickup.

## 2017-03-31 NOTE — Telephone Encounter (Signed)
This note was entered by mistake. [phone] [id number]

## 2017-04-04 NOTE — Telephone Encounter (Signed)
Encounter opened in error

## 2017-05-31 ENCOUNTER — Telehealth: Payer: Self-pay

## 2017-05-31 NOTE — Telephone Encounter (Signed)
On 05/31/17 I received a supplemental report from Dennis BastJoyce Johnson with [employer] for Doctor Craige CottaSood to complete. The death certificate and the [facility] this pm for Doctor Craige CottaSood to complete.  On 06/02/2017 I received the supplemental report back from Doctor [place] Wh[place]. I called Dennis BastJoyce Johnson with [employer] and left her a message that the report is ready for pickup.
